# Patient Record
Sex: Male | Born: 1965 | Race: White | Hispanic: No | Marital: Married | Smoking: Never smoker
Health system: Southern US, Community
[De-identification: ages and names within clinical notes are randomized; demographics above are authoritative.]

## PROBLEM LIST (undated history)

## (undated) DIAGNOSIS — E669 Obesity, unspecified: Secondary | ICD-10-CM

## (undated) DIAGNOSIS — E785 Hyperlipidemia, unspecified: Secondary | ICD-10-CM

## (undated) DIAGNOSIS — K649 Unspecified hemorrhoids: Secondary | ICD-10-CM

## (undated) DIAGNOSIS — I10 Essential (primary) hypertension: Secondary | ICD-10-CM

## (undated) DIAGNOSIS — M25562 Pain in left knee: Secondary | ICD-10-CM

## (undated) DIAGNOSIS — G4730 Sleep apnea, unspecified: Secondary | ICD-10-CM

## (undated) DIAGNOSIS — H269 Unspecified cataract: Secondary | ICD-10-CM

## (undated) HISTORY — DX: Obesity, unspecified: E66.9

## (undated) HISTORY — DX: Unspecified cataract: H26.9

## (undated) HISTORY — DX: Unspecified hemorrhoids: K64.9

## (undated) HISTORY — DX: Essential (primary) hypertension: I10

## (undated) HISTORY — DX: Sleep apnea, unspecified: G47.30

## (undated) HISTORY — DX: Hyperlipidemia, unspecified: E78.5

## (undated) HISTORY — PX: HERNIA REPAIR: SHX51

## (undated) HISTORY — DX: Pain in left knee: M25.562

## (undated) HISTORY — PX: ANOSCOPY: SHX299

---
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
## 2005-10-27 ENCOUNTER — Other Ambulatory Visit: Payer: Self-pay

## 2005-10-27 ENCOUNTER — Emergency Department: Payer: Self-pay | Admitting: Emergency Medicine

## 2007-10-20 ENCOUNTER — Ambulatory Visit: Payer: Self-pay | Admitting: Internal Medicine

## 2008-09-26 ENCOUNTER — Ambulatory Visit: Payer: Self-pay | Admitting: Internal Medicine

## 2008-12-05 ENCOUNTER — Ambulatory Visit: Payer: Self-pay | Admitting: Internal Medicine

## 2008-12-09 ENCOUNTER — Ambulatory Visit: Payer: Self-pay | Admitting: Internal Medicine

## 2011-04-09 ENCOUNTER — Encounter: Payer: Self-pay | Admitting: Internal Medicine

## 2011-04-09 ENCOUNTER — Ambulatory Visit (INDEPENDENT_AMBULATORY_CARE_PROVIDER_SITE_OTHER): Payer: Self-pay | Admitting: Internal Medicine

## 2011-04-09 DIAGNOSIS — E785 Hyperlipidemia, unspecified: Secondary | ICD-10-CM

## 2011-04-09 DIAGNOSIS — E119 Type 2 diabetes mellitus without complications: Secondary | ICD-10-CM

## 2011-04-09 DIAGNOSIS — I152 Hypertension secondary to endocrine disorders: Secondary | ICD-10-CM | POA: Insufficient documentation

## 2011-04-09 DIAGNOSIS — E663 Overweight: Secondary | ICD-10-CM

## 2011-04-09 DIAGNOSIS — I10 Essential (primary) hypertension: Secondary | ICD-10-CM

## 2011-04-09 LAB — COMPREHENSIVE METABOLIC PANEL
ALT: 34 U/L (ref 0–53)
AST: 27 U/L (ref 0–37)
Alkaline Phosphatase: 60 U/L (ref 39–117)
Creatinine, Ser: 1 mg/dL (ref 0.4–1.5)
Total Bilirubin: 0.7 mg/dL (ref 0.3–1.2)

## 2011-04-09 LAB — LIPID PANEL
HDL: 34.8 mg/dL — ABNORMAL LOW (ref >39.00)
Total CHOL/HDL Ratio: 5
VLDL: 25 mg/dL (ref 0.0–40.0)

## 2011-04-09 LAB — HEMOGLOBIN A1C: Hgb A1c MFr Bld: 8.2 % — ABNORMAL HIGH (ref 4.6–6.5)

## 2011-04-09 NOTE — Progress Notes (Signed)
  Subjective:    Patient ID: Joshua Arellano, male    DOB: 03/15/1966, 45 y.o.   MRN: [id number]  HPI  45 yo white male with a history of diabetes mellitus, hyperlipidemia and hypertension presents for 3 month followup on chronic medical issues.  Last Hgba1c was 7.2 in early June.  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  Has not been following a carbohydrate restricted diet.    Review of Systems  Constitutional: Negative for fever, chills, diaphoresis, activity change, appetite change, fatigue and unexpected weight change.  HENT: Negative for hearing loss, ear pain, nosebleeds, congestion, sore throat, facial swelling, rhinorrhea, sneezing, drooling, mouth sores, trouble swallowing, neck pain, neck stiffness, dental problem, voice change, postnasal drip, sinus pressure, tinnitus and ear discharge.   Eyes: Negative for photophobia, pain, discharge, redness, itching and visual disturbance.  Respiratory: Negative for apnea, cough, choking, chest tightness, shortness of breath, wheezing and stridor.   Cardiovascular: Negative for chest pain, palpitations and leg swelling.  Gastrointestinal: Negative for nausea, vomiting, abdominal pain, diarrhea, constipation, blood in stool, abdominal distention, anal bleeding and rectal pain.  Genitourinary: Negative for dysuria, urgency, frequency, hematuria, flank pain, decreased urine volume, scrotal swelling, difficulty urinating and testicular pain.  Musculoskeletal: Negative for myalgias, back pain, joint swelling, arthralgias and gait problem.  Skin: Negative for color change, rash and wound.  Neurological: Negative for dizziness, tremors, seizures, syncope, speech difficulty, weakness, light-headedness, numbness and headaches.  Psychiatric/Behavioral: Negative for suicidal ideas, hallucinations, behavioral problems, confusion, sleep disturbance, dysphoric mood,  decreased concentration and agitation. The patient is not nervous/anxious.        Objective:   Physical Exam  Constitutional: He is oriented to person, place, and time. He appears well-developed and well-nourished.  HENT:  Head: Normocephalic and atraumatic.  Mouth/Throat: Oropharynx is clear and moist.  Eyes: Conjunctivae and EOM are normal.  Neck: Normal range of motion. Neck supple. No JVD present. No thyromegaly present.  Cardiovascular: Normal rate, regular rhythm and normal heart sounds.   Pulmonary/Chest: Effort normal and breath sounds normal. He has no wheezes. He has no rales.  Abdominal: Soft. Bowel sounds are normal. He exhibits no mass. There is no tenderness. There is no rebound.  Musculoskeletal: Normal range of motion. He exhibits no edema.  Neurological: He is alert and oriented to person, place, and time.  Skin: Skin is warm and dry.  Psychiatric: He has a normal mood and affect.         Assessment & Plan:

## 2011-04-09 NOTE — Patient Instructions (Addendum)
Check your blood pressure not more than once daily for a week or two and call me with results.  [email or web address] Goal is 130/80 or less.  Labs will determine whether we change your meds or not for diabetes and cholesterol.   You will lose weight if you can cut a few of the starches out of your diet.  Try finding Joseph's low carb flatbread and pita bread . (4 net carbs)  Consider snacking on almonds, pecans, walnuts, macadamia nuts or pistachios.  5 or 6 smaller meals helps your metabolism stay up and helps weight loss .  Consider reading about the Medifast diet (online).    Find out if [facility] offers free TDap VACCINE  to employees.

## 2011-04-09 NOTE — Assessment & Plan Note (Addendum)
Currently using statin therapy for goal < 70.  Repeat lipids are due

## 2011-04-09 NOTE — Assessment & Plan Note (Addendum)
Borderline control per last last  A1c of 7.2 on 01/11/11.  Repeat due this week.  No changes to medications until his labs reviewed. Foot exam was done last visit and sensation was intact to microfilament.  Reminder for diabetic eye exam.

## 2011-04-09 NOTE — Assessment & Plan Note (Signed)
Currently elevated.  Will  have him check his bps at home and adjust medication if elevated,

## 2011-04-17 MED ORDER — METFORMIN HCL ER 750 MG PO TB24: 1500.0000 mg | ORAL_TABLET | Freq: Every day | ORAL | Status: DC

## 2011-04-17 NOTE — Progress Notes (Signed)
Addended by: Duncan Dull on: 04/17/2011 01:02 PM   Modules accepted: Orders

## 2011-04-19 ENCOUNTER — Encounter: Payer: Self-pay | Admitting: Internal Medicine

## 2011-04-19 MED ORDER — METFORMIN HCL ER (OSM) 1000 MG PO TB24: ORAL_TABLET | ORAL | Status: DC

## 2011-04-19 NOTE — Progress Notes (Signed)
Addended by: Duncan Dull on: 04/19/2011 01:32 PM   Modules accepted: Orders

## 2011-04-23 ENCOUNTER — Other Ambulatory Visit: Payer: Self-pay | Admitting: Internal Medicine

## 2011-06-06 ENCOUNTER — Ambulatory Visit: Payer: Self-pay

## 2011-06-07 ENCOUNTER — Telehealth: Payer: Self-pay | Admitting: *Deleted

## 2011-06-07 NOTE — Telephone Encounter (Signed)
Patient's wife sent email to MD regarding being unable to be seen when he was sick. I spoke with patient. He was seen by the UC, dx with bronchitis and is beginning to feel better. I apologized that we were unable to see him when he was sick but we have now put in place processes to be able to see sick patients. Advised him to come in if he did not continue to feel better or became worse.

## 2011-07-10 ENCOUNTER — Ambulatory Visit (INDEPENDENT_AMBULATORY_CARE_PROVIDER_SITE_OTHER): Payer: BC Managed Care – PPO | Admitting: Internal Medicine

## 2011-07-10 ENCOUNTER — Encounter: Payer: Self-pay | Admitting: Internal Medicine

## 2011-07-10 DIAGNOSIS — Z23 Encounter for immunization: Secondary | ICD-10-CM

## 2011-07-10 DIAGNOSIS — M25562 Pain in left knee: Secondary | ICD-10-CM | POA: Insufficient documentation

## 2011-07-10 DIAGNOSIS — E669 Obesity, unspecified: Secondary | ICD-10-CM

## 2011-07-10 DIAGNOSIS — I10 Essential (primary) hypertension: Secondary | ICD-10-CM

## 2011-07-10 DIAGNOSIS — E785 Hyperlipidemia, unspecified: Secondary | ICD-10-CM

## 2011-07-10 DIAGNOSIS — K625 Hemorrhage of anus and rectum: Secondary | ICD-10-CM

## 2011-07-10 DIAGNOSIS — K649 Unspecified hemorrhoids: Secondary | ICD-10-CM | POA: Insufficient documentation

## 2011-07-10 DIAGNOSIS — E119 Type 2 diabetes mellitus without complications: Secondary | ICD-10-CM

## 2011-07-10 LAB — COMPREHENSIVE METABOLIC PANEL
Albumin: 4.4 g/dL (ref 3.5–5.2)
CO2: 28 mEq/L (ref 19–32)
Chloride: 103 mEq/L (ref 96–112)
GFR: 139.72 mL/min (ref >60.00)
Glucose, Bld: 111 mg/dL — ABNORMAL HIGH (ref 70–99)
Potassium: 4.4 mEq/L (ref 3.5–5.1)
Sodium: 139 mEq/L (ref 135–145)
Total Protein: 7 g/dL (ref 6.0–8.3)

## 2011-07-10 LAB — LDL CHOLESTEROL, DIRECT: Direct LDL: 103.7 mg/dL

## 2011-07-10 MED ORDER — AMLODIPINE BESYLATE 10 MG PO TABS: 10.0000 mg | ORAL_TABLET | Freq: Every day | ORAL | Status: DC

## 2011-07-10 NOTE — Patient Instructions (Addendum)
Increase amlodipine to 10 mg daily  For goal bp 130/80  Or less.  Recheck in one week  Try a shot of Afrin each side before bed for snoring.   If we can get your old prescription /sleep study results,  We will order CPA from that

## 2011-07-10 NOTE — Assessment & Plan Note (Signed)
Prior colonoscopy done bc of hemorrhoids was clean.

## 2011-07-10 NOTE — Assessment & Plan Note (Signed)
With recent bleeding,  Prior colonoscopy

## 2011-07-11 ENCOUNTER — Telehealth: Payer: Self-pay | Admitting: Internal Medicine

## 2011-07-11 DIAGNOSIS — I499 Cardiac arrhythmia, unspecified: Secondary | ICD-10-CM

## 2011-07-11 DIAGNOSIS — E669 Obesity, unspecified: Secondary | ICD-10-CM | POA: Insufficient documentation

## 2011-07-11 NOTE — Assessment & Plan Note (Signed)
Addressed at last visit.  He has lost 10 lbs in the last 3 months.

## 2011-07-11 NOTE — Progress Notes (Signed)
  Subjective:    Patient ID: Joshua Arellano, male    DOB: [birth year], 45 y.o.   MRN: [id number]  HPI  45 yo white male with history of Type 2 DM, hyperlipidemia, obesity returns for 3 month follow up on chronic medical conditions.  He feesl generally well , has been exercising, watching his diet and losing weight since his last visit     Review of Systems     Objective:   Physical Exam        Assessment & Plan:   Subjective:     Joshua Arellano is a 45 y.o. male who presents for follow up of diabetes.. Current symptoms include: none. Patient denies foot ulcerations, hypoglycemia , paresthesia of the feet, polydipsia, polyuria and visual disturbances. Evaluation to date has been: fasting blood sugar, fasting lipid panel and hemoglobin A1C. Home sugars: BGs consistently in an acceptable range. Current treatments: more intensive attention to diet which has been effective and increased aerobic exercise which has been effective. Last dilated eye exam within the last year.  The following portions of the patient's history were reviewed and updated as appropriate: allergies, current medications, past family history, past medical history, past social history, past surgical history and problem list.  Review of Systems A comprehensive review of systems was negative.    Objective:    BP 140/86  Pulse 85  Temp(Src) 98.2 F (36.8 C) (Oral)  Resp 16  Ht 5\' 11"  (1.803 m)  Wt 238 lb 8 oz (108.183 kg)  BMI 33.26 kg/m2  SpO2 100% General appearance: alert, cooperative and appears stated age Neck: no adenopathy, no carotid bruit, no JVD, supple, symmetrical, trachea midline and thyroid not enlarged, symmetric, no tenderness/mass/nodules Lungs: clear to auscultation bilaterally Heart: regular rate and rhythm, S1, S2 normal, no murmur, click, rub or gallop Abdomen: soft, non-tender; bowel sounds normal; no masses,  no organomegaly Extremities: extremities normal, atraumatic, no cyanosis or  edema    @DMFOOTEXAM @  Patient was evaluated for proper footwear and sizing.  Laboratory: No components found with this basename: A1C      Assessment:    Diabetes mellitus Type II, under good control.    Plan:    Counseling at today's visit: set a weight loss goal of 12 lbs over the next 3 months. Encouraged aerobic exercise. Reminded to get yearly retinal exam. Discussed ways to avoid symptomatic hypoglycemia. Continued sulfonylurea; see medication orders.  Continued metformin; see  medication orders.

## 2011-07-11 NOTE — Assessment & Plan Note (Signed)
Repeat cholesterol in December was markedly different; LDL HDL 112 trigs 63 .   continue pravastatin .

## 2011-07-11 NOTE — Telephone Encounter (Signed)
ereror

## 2011-07-11 NOTE — Assessment & Plan Note (Addendum)
Not at goal,  Elevation confirmed.  Increase amlodipine dose to 10 mg daily.  He is on maximum dose of losartan at 100 mg daily.

## 2011-10-08 ENCOUNTER — Encounter: Payer: Self-pay | Admitting: Internal Medicine

## 2011-10-08 ENCOUNTER — Ambulatory Visit (INDEPENDENT_AMBULATORY_CARE_PROVIDER_SITE_OTHER): Payer: BC Managed Care – PPO | Admitting: Internal Medicine

## 2011-10-08 DIAGNOSIS — I10 Essential (primary) hypertension: Secondary | ICD-10-CM

## 2011-10-08 DIAGNOSIS — E785 Hyperlipidemia, unspecified: Secondary | ICD-10-CM

## 2011-10-08 DIAGNOSIS — E119 Type 2 diabetes mellitus without complications: Secondary | ICD-10-CM

## 2011-10-08 NOTE — Assessment & Plan Note (Signed)
Managed with glipizide and metformin and diet.  hgba1c is due .  He is up to date with eye exams.

## 2011-10-08 NOTE — Assessment & Plan Note (Signed)
Slightly above goal on rechek 140/86.  Will have him recheck bp at home and adjust medications if needed.

## 2011-10-08 NOTE — Assessment & Plan Note (Signed)
continue pravastatin for goal LDL 70

## 2011-10-08 NOTE — Progress Notes (Signed)
Subjective:    Patient ID: Joshua Arellano, male    DOB: [birth year], 46 y.o.   MRN: [id number]  HPI  46 yr old male with history of DM 2 , hyperlipidemia presents for 3 month follow up.  He has had some lapses in diabetes control , has gained a few lbs,  But has started a "couch to 5K" running program about 3 weeks ago.  No recent lows,  Taking his medications regularly.   Past Medical History  Diagnosis Date  . Diabetes mellitus   . Hyperlipidemia   . Hypertension   . Obesity (BMI 30-39.9)   . Hemorrhoid   . Knee pain, left     episodic   Current Outpatient Prescriptions on File Prior to Visit  Medication Sig Dispense Refill  . ACCU-CHEK COMPACT TEST DRUM test strip TEST TWICE DAILY  102 each  11  . glipiZIDE (GLUCOTROL) 10 MG tablet Take 10 mg by mouth 2 (two) times daily before a meal.        . hydrochlorothiazide 25 MG tablet Take 25 mg by mouth daily.        Marland Kitchen losartan (COZAAR) 100 MG tablet Take 100 mg by mouth daily.        . metFORMIN (FORTAMET) 1000 MG (OSM) 24 hr tablet Two tablets by mouth every day  60 tablet  3  . pravastatin (PRAVACHOL) 40 MG tablet Take 40 mg by mouth daily.         Review of Systems  Constitutional: Negative for fever, chills, diaphoresis, activity change, appetite change, fatigue and unexpected weight change.  HENT: Negative for hearing loss, ear pain, nosebleeds, congestion, sore throat, facial swelling, rhinorrhea, sneezing, drooling, mouth sores, trouble swallowing, neck pain, neck stiffness, dental problem, voice change, postnasal drip, sinus pressure, tinnitus and ear discharge.   Eyes: Negative for photophobia, pain, discharge, redness, itching and visual disturbance.  Respiratory: Negative for apnea, cough, choking, chest tightness, shortness of breath, wheezing and stridor.   Cardiovascular: Negative for chest pain, palpitations and leg swelling.  Gastrointestinal: Negative for nausea, vomiting, abdominal pain, diarrhea, constipation, blood in  stool, abdominal distention, anal bleeding and rectal pain.  Genitourinary: Negative for dysuria, urgency, frequency, hematuria, flank pain, decreased urine volume, scrotal swelling, difficulty urinating and testicular pain.  Musculoskeletal: Negative for myalgias, back pain, joint swelling, arthralgias and gait problem.  Skin: Negative for color change, rash and wound.  Neurological: Negative for dizziness, tremors, seizures, syncope, speech difficulty, weakness, light-headedness, numbness and headaches.  Psychiatric/Behavioral: Negative for suicidal ideas, hallucinations, behavioral problems, confusion, sleep disturbance, dysphoric mood, decreased concentration and agitation. The patient is not nervous/anxious.        Objective:   Physical Exam  Constitutional: He is oriented to person, place, and time.  HENT:  Head: Normocephalic and atraumatic.  Mouth/Throat: Oropharynx is clear and moist.  Eyes: Conjunctivae and EOM are normal.  Neck: Normal range of motion. Neck supple. No JVD present. No thyromegaly present.  Cardiovascular: Normal rate, regular rhythm and normal heart sounds.   Pulmonary/Chest: Effort normal and breath sounds normal. He has no wheezes. He has no rales.  Abdominal: Soft. Bowel sounds are normal. He exhibits no mass. There is no tenderness. There is no rebound.  Musculoskeletal: Normal range of motion. He exhibits no edema.  Neurological: He is alert and oriented to person, place, and time.  Skin: Skin is warm and dry.  Psychiatric: He has a normal mood and affect.      Assessment & Plan:  Diabetes mellitus Managed with glipizide and metformin and diet.  hgba1c is due .  He is up to date with eye exams.   Dyslipidemia, goal LDL below 70 continue pravastatin for goal LDL 70  Hypertension Slightly above goal on rechek 140/86.  Will have him recheck bp at home and adjust medications if needed.     Updated Medication List Outpatient Encounter Prescriptions  as of 10/08/2011  Medication Sig Dispense Refill  . ACCU-CHEK COMPACT TEST DRUM test strip TEST TWICE DAILY  102 each  11  . amLODipine (NORVASC) 10 MG tablet Take 5 mg by mouth daily.      Marland Kitchen glipiZIDE (GLUCOTROL) 10 MG tablet Take 10 mg by mouth 2 (two) times daily before a meal.        . hydrochlorothiazide 25 MG tablet Take 25 mg by mouth daily.        Marland Kitchen losartan (COZAAR) 100 MG tablet Take 100 mg by mouth daily.        . metFORMIN (FORTAMET) 1000 MG (OSM) 24 hr tablet Two tablets by mouth every day  60 tablet  3  . pravastatin (PRAVACHOL) 40 MG tablet Take 40 mg by mouth daily.        Marland Kitchen DISCONTD: amLODipine (NORVASC) 10 MG tablet Take 1 tablet (10 mg total) by mouth daily.  30 tablet  2

## 2011-10-08 NOTE — Patient Instructions (Addendum)
Continue 5 mg amlodipine for now,  Increase  in 1 month if BP continues to be > 130/80  We may end up dropping the hctz as your start to work out more if you develop trouble with cramping     Low glycemic index diet, eating 6 smaller meals daily  Protein  Shakes (EAS Carb Control  Or Atkins ,  Available everywhere,   In  cases at BJs )  2.5 carbs  Protein bar by Atkins (snack size,  BJ's)  At 10 am  Lunch: sandwich on pita bread (Joseph's makes a pita bread and a flat bread , available at [employer] and BJ's)  Mission makes a low carb tortilla.  Mid day :  Another protein bar,  Or cheese stick, almonds, walnuts, pistachios, pecans, peanuts,  Macadamia nuts  Dinner:  "mean and green:"  Meat, salad, and green veggie : use ranch, vinagrette,  Blue cheese, etc  Evening : Breyer's low carb fudgiscle, ice cream (Carb Smart)

## 2011-10-09 LAB — COMPLETE METABOLIC PANEL WITH GFR
CO2: 26 mEq/L (ref 19–32)
Calcium: 9.8 mg/dL (ref 8.4–10.5)
Chloride: 100 mEq/L (ref 96–112)
Creat: 0.93 mg/dL (ref 0.50–1.35)
GFR, Est African American: >89 mL/min
GFR, Est Non African American: >89 mL/min
Glucose, Bld: 226 mg/dL — ABNORMAL HIGH (ref 70–99)
Sodium: 139 mEq/L (ref 135–145)
Total Bilirubin: 0.5 mg/dL (ref 0.3–1.2)
Total Protein: 6.6 g/dL (ref 6.0–8.3)

## 2011-11-05 ENCOUNTER — Telehealth: Payer: Self-pay | Admitting: Internal Medicine

## 2011-11-05 ENCOUNTER — Ambulatory Visit: Payer: Self-pay | Admitting: Internal Medicine

## 2011-11-05 NOTE — Telephone Encounter (Signed)
Triage Record Num: [id number] Operator: Geanie Berlin Patient Name: Joshua Arellano Call Date & Time: 11/05/2011 1:27:23PM Patient Phone: [phone] PCP: Duncan Dull Patient Gender: Male PCP Fax : [phone] Patient DOB: [birth year] Practice Name: [facility] Reason for Call: Caller: Joshua Arellano/Patient; PCP: Duncan Dull; CB#: [phone]; Call regarding Posison Ivy On the Face.; Onset 11/03/11. Afebrile. FBS not tested. Has poison ivy on arms, legs and now face, near nose and R eye. R eye is swollen. Using Hydrocortisone QID. Area near knee is "oozing." Advised to see MD within 4 hrs for involves the eyes per Poison Ivy Exposure Guideline. No appt remain for 11/05/11; instructed to go to UC. Is in [place]; states will go to local UC. Protocol(s) Used: [employer], [place], or [facility] Exposure Recommended Outcome per Protocol: See Provider within 4 hours Reason for Outcome: Involves eyes, mouth, or genitals. Care Advice: ~ Resting will help avoid overheating and sweating which will intensify the irritation. Apply cool compresses to affected area(s) for 20 minutes 4 to 6 times daily to help relieve itching and provide a topical anesthesia. ~ ~ Call provider if symptoms worsen or new symptoms develop. ~ SYMPTOM / CONDITION MANAGEMENT ~ List, or take, all current prescription(s), nonprescription or alternative medication(s) to provider for evaluation. For symptom relief, consider nonprescription antihistamines (such as Allerest, Claritin, Zyrtec, Chlor-Trimetron, Benadryl, etc.) as directed on label or by pharmacist. Drowsiness may result, especially in geriatric patients. Non-sedating antihistamines are available without a prescription. ~ Itching Relief: - Avoid scratching or rubbing irritated area; may cause further irritation and secondary infection - Take cool showers or baths several times a day to relieve itching; do not use soap - If cool water alone does not relieve  itching, try adding 1/2 to 1 cup baking soda to bath water - Follow with application of a bland lotion such as calamine (do not apply to the eyes or genitals). ~ 04/

## 2011-11-05 NOTE — Telephone Encounter (Signed)
Caller: Brock/Patient; PCP: Duncan Dull; CB#: [phone]; Call regarding Posison Ivy On the Face.; Onset 11/03/11.  Afebrile. FBS not tested.  Has poison ivy on arms, legs and now face, near nose and R eye. R eye is swollen.  Using Hydrocortisone QID. Area near knee is "oozing."  Advised to see MD within 4 hrs for involves the eyes per Poison Ivy Exposure Guideline.  No appt remain for 11/05/11; instructed to go to UC.  Is in [place]; states will go to local UC.

## 2012-01-21 ENCOUNTER — Encounter: Payer: Self-pay | Admitting: Internal Medicine

## 2012-01-21 ENCOUNTER — Ambulatory Visit (INDEPENDENT_AMBULATORY_CARE_PROVIDER_SITE_OTHER): Payer: BC Managed Care – PPO | Admitting: Internal Medicine

## 2012-01-21 VITALS — BP 124/78 | HR 88 | Temp 98.0°F | Resp 14 | Wt 235.0 lb

## 2012-01-21 DIAGNOSIS — E119 Type 2 diabetes mellitus without complications: Secondary | ICD-10-CM

## 2012-01-21 DIAGNOSIS — E785 Hyperlipidemia, unspecified: Secondary | ICD-10-CM

## 2012-01-21 DIAGNOSIS — I10 Essential (primary) hypertension: Secondary | ICD-10-CM

## 2012-01-21 DIAGNOSIS — E669 Obesity, unspecified: Secondary | ICD-10-CM

## 2012-01-21 MED ORDER — HYDROCHLOROTHIAZIDE 25 MG PO TABS: 25.0000 mg | ORAL_TABLET | Freq: Every day | ORAL | Status: DC

## 2012-01-21 MED ORDER — PRAVASTATIN SODIUM 40 MG PO TABS: 40.0000 mg | ORAL_TABLET | Freq: Every day | ORAL | Status: DC

## 2012-01-21 MED ORDER — AMLODIPINE BESYLATE 10 MG PO TABS: 5.0000 mg | ORAL_TABLET | Freq: Every day | ORAL | Status: DC

## 2012-01-21 MED ORDER — LOSARTAN POTASSIUM 100 MG PO TABS: 100.0000 mg | ORAL_TABLET | Freq: Every day | ORAL | Status: DC

## 2012-01-21 NOTE — Patient Instructions (Addendum)
Consider the Low Glycemic Index Diet and 6 smaller meals daily .  This boosts your metabolism and regulates your sugars:   7 AM Low carbohydrate Protein  Shakes (EAS Carb Control  Or Atkins ,  Available everywhere,   In  cases at BJs )  2.5 carbs  (Add or substitute a toasted sandwhich thin w/ peanut butter)  10 AM: Protein bar by Atkins (snack size,  Chocolate lover's variety at  BJ's)    Lunch: sandwich on pita bread or flatbread (Joseph's makes a pita bread and a flat bread , available at [employer] and BJ's; Toufayah makes a low carb flatbread available at [employer] and [employer]) Mission makes a low carb whole wheat tortilla available at [employer] most grocery stores   3 PM:  Mid day :  Another protein bar,  Or a  cheese stick, 1/4 cup of almonds, walnuts, pistachios, pecans, peanuts,  Macadamia nuts  6 PM  Dinner:  "mean and green:"  Meat/chicken/fish, salad, and green veggie : use ranch, vinagrette,  Blue cheese, etc  9 PM snack : Breyer's low carb fudgsicle or  ice cream bar (Carb Smart), or  Weight Watcher's ice cream bar , or another protein shake  Also try [place] yogurt,  The lowest carb is Dannon's followed by [employer].

## 2012-01-22 ENCOUNTER — Encounter: Payer: Self-pay | Admitting: Internal Medicine

## 2012-01-22 NOTE — Assessment & Plan Note (Signed)
I have addressed  BMI and recommended a low glycemic index diet utilizing smaller more frequent meals to increase metabolism.  I have also recommended that patient start regular exercise with a goal of 30 minutes of aerobic exercise a minimum of 5 days per week.

## 2012-01-22 NOTE — Progress Notes (Signed)
Patient ID: Joshua Arellano, male   DOB: 12-Oct-1965, 46 y.o.   MRN: [id number]  Patient Active Problem List  Diagnosis  . Diabetes mellitus type 2 in obese  . Dyslipidemia, goal LDL below 70  . Hypertension  . Rectal bleeding  . Hemorrhoid  . Knee pain, left  . Obesity (BMI 30-39.9)    Subjective:  CC:   Chief Complaint  Patient presents with  . Follow-up    HPI:   Joshua Arellano a 46 y.o. male who presents for Follow up on diabetes,  hypertension, hyperlipiemia and overweight.  He has entered a diabetes study that had him wearing a sub q glucose meter for a week, whic hhe has returned ,  Results are pending. he started taking victoza on June 13.  His morning fasting sugars have been < 100 but post  breakfast are 200 due to food choices.  e has stopped his evening dose of glipizide as og due to several low blood sugars iat 3 am (as low as 63) and has been keeping a food and cbg log for the past week .  His diet has a significant amount of fast food , junk food ([employer], biscuit, Chik Fil A sandwich,  Ar[employer] Reuben, and potato chips all in one day) and starches (egg rolls, rice, hash browns) He has lost 8 lbs in 3 months.    Past Medical History  Diagnosis Date  . Diabetes mellitus   . Hyperlipidemia   . Hypertension   . Obesity (BMI 30-39.9)   . Hemorrhoid   . Knee pain, left     episodic    History reviewed. No pertinent past surgical history.       The following portions of the patient's history were reviewed and updated as appropriate: Allergies, current medications, and problem list.    Review of Systems:   12 Pt  review of systems was negative except those addressed in the HPI,     History   Social History  . Marital Status: Married    Spouse Name: N/A    Number of Children: N/A  . Years of Education: N/A   Occupational History  . Not on file.   Social History Main Topics  . Smoking status: Never Smoker   . Smokeless tobacco: Never Used    . Alcohol Use: No     rare  . Drug Use: No  . Sexually Active: Not on file   Other Topics Concern  . Not on file   Social History Narrative  . No narrative on file    Objective:  BP 124/78  Pulse 88  Temp 98 F (36.7 C) (Oral)  Resp 14  Wt 235 lb (106.595 kg)  SpO2 97%  General appearance: alert, cooperative and appears stated age Ears: normal TM's and external ear canals both ears Throat: lips, mucosa, and tongue normal; teeth and gums normal Neck: no adenopathy, no carotid bruit, supple, symmetrical, trachea midline and thyroid not enlarged, symmetric, no tenderness/mass/nodules Back: symmetric, no curvature. ROM normal. No CVA tenderness. Lungs: clear to auscultation bilaterally Heart: regular rate and rhythm, S1, S2 normal, no murmur, click, rub or gallop Abdomen: soft, non-tender; bowel sounds normal; no masses,  no organomegaly Pulses: 2+ and symmetric Skin: Skin color, texture, turgor normal. No rashes or lesions Lymph nodes: Cervical, supraclavicular, and axillary nodes normal.  Assessment and Plan:  Diabetes mellitus type 2 in obese With some weight loss since starting victoza. He continues tro eat a diet  not appropriate for an overweight diabetic but has started to see the effect of his choices on his blood sugars. He is due for hgba1c which is now doen through his study, so I will await results,.  Continue victoza and increase dose to 1.8 mg  Dyslipidemia, goal LDL below 70 LDL was 105 and trgs normal in Dec.  HDL was spuriously (?) high at 112 at that time,  continue pravastatin and recheck is due.  Hypertension Well controlled on current regimen. Renal function stable, no changes today.  Obesity (BMI 30-39.9) I have addressed  BMI and recommended a low glycemic index diet utilizing smaller more frequent meals to increase metabolism.  I have also recommended that patient start regular exercise with a goal of 30 minutes of aerobic exercise a minimum of 5  days per week.     Updated Medication List Outpatient Encounter Prescriptions as of 01/21/2012  Medication Sig Dispense Refill  . ACCU-CHEK COMPACT TEST DRUM test strip TEST TWICE DAILY  102 each  11  . amLODipine (NORVASC) 10 MG tablet Take 0.5 tablets (5 mg total) by mouth daily.  90 tablet  3  . hydrochlorothiazide (HYDRODIURIL) 25 MG tablet Take 1 tablet (25 mg total) by mouth daily.  90 tablet  3  . Liraglutide (VICTOZA) 18 MG/3ML SOLN Inject 1.2 mLs into the skin daily.      Marland Kitchen losartan (COZAAR) 100 MG tablet Take 1 tablet (100 mg total) by mouth daily.  90 tablet  3  . metFORMIN (FORTAMET) 1000 MG (OSM) 24 hr tablet Two tablets by mouth every day  60 tablet  3  . pravastatin (PRAVACHOL) 40 MG tablet Take 1 tablet (40 mg total) by mouth daily.  90 tablet  3  . DISCONTD: amLODipine (NORVASC) 10 MG tablet Take 5 mg by mouth daily.      Marland Kitchen DISCONTD: hydrochlorothiazide 25 MG tablet Take 25 mg by mouth daily.        Marland Kitchen DISCONTD: losartan (COZAAR) 100 MG tablet Take 100 mg by mouth daily.        Marland Kitchen DISCONTD: pravastatin (PRAVACHOL) 40 MG tablet Take 40 mg by mouth daily.        Marland Kitchen DISCONTD: glipiZIDE (GLUCOTROL) 10 MG tablet Take 10 mg by mouth 2 (two) times daily before a meal.           No orders of the defined types were placed in this encounter.    Return in about 3 months (around 04/22/2012).

## 2012-01-22 NOTE — Assessment & Plan Note (Signed)
LDL was 105 and trgs normal in Dec.  HDL was spuriously (?) high at 112 at that time,  continue pravastatin and recheck is due.

## 2012-01-22 NOTE — Assessment & Plan Note (Signed)
Well controlled on current regimen. Renal function stable, no changes today. 

## 2012-01-22 NOTE — Assessment & Plan Note (Signed)
With some weight loss since starting victoza. He continues tro eat a diet not appropriate for an overweight diabetic but has started to see the effect of his choices on his blood sugars. He is due for hgba1c which is now doen through his study, so I will await results,.  Continue victoza and increase dose to 1.8 mg

## 2012-02-27 ENCOUNTER — Other Ambulatory Visit: Payer: Self-pay | Admitting: *Deleted

## 2012-02-27 MED ORDER — HYDROCHLOROTHIAZIDE 25 MG PO TABS: 25.0000 mg | ORAL_TABLET | Freq: Every day | ORAL | Status: DC

## 2012-04-03 ENCOUNTER — Other Ambulatory Visit: Payer: Self-pay | Admitting: Internal Medicine

## 2012-04-03 MED ORDER — PRAVASTATIN SODIUM 40 MG PO TABS: 40.0000 mg | ORAL_TABLET | Freq: Every day | ORAL | Status: DC

## 2012-04-30 ENCOUNTER — Ambulatory Visit (INDEPENDENT_AMBULATORY_CARE_PROVIDER_SITE_OTHER): Payer: BC Managed Care – PPO | Admitting: Internal Medicine

## 2012-04-30 ENCOUNTER — Encounter: Payer: Self-pay | Admitting: Internal Medicine

## 2012-04-30 VITALS — BP 116/70 | HR 94 | Temp 97.8°F | Resp 18 | Wt 224.8 lb

## 2012-04-30 DIAGNOSIS — E669 Obesity, unspecified: Secondary | ICD-10-CM

## 2012-04-30 DIAGNOSIS — Q5320 Undescended testicle, unspecified, bilateral: Secondary | ICD-10-CM

## 2012-04-30 DIAGNOSIS — Z0000 Encounter for general adult medical examination without abnormal findings: Secondary | ICD-10-CM

## 2012-04-30 DIAGNOSIS — Q539 Undescended testicle, unspecified: Secondary | ICD-10-CM

## 2012-04-30 DIAGNOSIS — E876 Hypokalemia: Secondary | ICD-10-CM

## 2012-04-30 DIAGNOSIS — Z125 Encounter for screening for malignant neoplasm of prostate: Secondary | ICD-10-CM | POA: Insufficient documentation

## 2012-04-30 DIAGNOSIS — E785 Hyperlipidemia, unspecified: Secondary | ICD-10-CM

## 2012-04-30 DIAGNOSIS — E119 Type 2 diabetes mellitus without complications: Secondary | ICD-10-CM

## 2012-04-30 DIAGNOSIS — E1169 Type 2 diabetes mellitus with other specified complication: Secondary | ICD-10-CM

## 2012-04-30 LAB — MICROALBUMIN / CREATININE URINE RATIO: Creatinine,U: 43.5 mg/dL

## 2012-04-30 LAB — COMPREHENSIVE METABOLIC PANEL
AST: 24 U/L (ref 0–37)
Albumin: 4.1 g/dL (ref 3.5–5.2)
Alkaline Phosphatase: 59 U/L (ref 39–117)
BUN: 8 mg/dL (ref 6–23)
Potassium: 3.4 mEq/L — ABNORMAL LOW (ref 3.5–5.1)
Sodium: 138 mEq/L (ref 135–145)
Total Protein: 7.3 g/dL (ref 6.0–8.3)

## 2012-04-30 LAB — LIPID PANEL
LDL Cholesterol: 93 mg/dL (ref 0–99)
VLDL: 10.4 mg/dL (ref 0.0–40.0)

## 2012-04-30 MED ORDER — METFORMIN HCL ER 750 MG PO TB24: ORAL_TABLET | ORAL | Status: DC

## 2012-04-30 NOTE — Progress Notes (Signed)
Patient ID: Joshua Arellano, male   DOB: 03-29-1966, 46 y.o.   MRN: [id number]  Patient Active Problem List  Diagnosis  . Diabetes mellitus type 2 in obese  . Dyslipidemia, goal LDL below 70  . Hypertension  . Rectal bleeding  . Hemorrhoid  . Knee pain, left  . Obesity (BMI 30-39.9)  . Palpable undescended testes  . Routine general medical examination at a health care facility    Subjective:  CC:   Chief Complaint  Patient presents with  . Annual Exam    HPI:   Joshua Arellano a 46 y.o. male who presents for his annual exam. In for followup on his chronic medical conditions which include diabetes obesity and hyperlipidemia. Three-month followup on diabetes. He has had progressive loss of control over the last year.  He has had difficutly adhering to a lowglycemic index diet because he  and eats at fast food restaurants at least 8 times per week. He is enrolled in diabetes study at [facility] which is testing impact of 24-hour glucometer on glycemic control. He has brought several spread sheets that correlate his blood sugars to his activity level and his diet. Every blood sugar that followed a fast food meal with the exception of [employer] was uncontrolled. He does note that his blood sugars remained under excellent control if he substituted and Atkins protein bar for his meal or snack.  Victoza was added to and his hgba1c improved for 8 to 6.4.  His dose of glimperide caused recurrent hypoglycemia so his dose was reduced to 1 mg daily by [facility] endocrinologist Dr. Wylie Hail.  He is having One or two loose stools daily. He is not in a supervised exercise program. He has lost 5 pounds since his last visit    Past Medical History  Diagnosis Date  . Diabetes mellitus   . Hyperlipidemia   . Hypertension   . Obesity (BMI 30-39.9)   . Hemorrhoid   . Knee pain, left     episodic    History reviewed. No pertinent past surgical history.       The following portions of the patient's  history were reviewed and updated as appropriate: Allergies, current medications, and problem list.    Review of Systems:   12 Pt  review of systems was negative except those addressed in the HPI,     History   Social History  . Marital Status: Married    Spouse Name: N/A    Number of Children: N/A  . Years of Education: N/A   Occupational History  . Not on file.   Social History Main Topics  . Smoking status: Never Smoker   . Smokeless tobacco: Never Used  . Alcohol Use: No     rare  . Drug Use: No  . Sexually Active: Not on file   Other Topics Concern  . Not on file   Social History Narrative  . No narrative on file    Objective:  BP 116/70  Pulse 94  Temp 97.8 F (36.6 C) (Oral)  Resp 18  Wt 224 lb 12 oz (101.946 kg)  SpO2 99%  General appearance: alert, cooperative and appears stated age Ears: normal TM's and external ear canals both ears Throat: lips, mucosa, and tongue normal; teeth and gums normal Neck: no adenopathy, no carotid bruit, supple, symmetrical, trachea midline and thyroid not enlarged, symmetric, no tenderness/mass/nodules Back: symmetric, no curvature. ROM normal. No CVA tenderness. Lungs: clear to auscultation bilaterally Heart: regular  rate and rhythm, S1, S2 normal, no murmur, click, rub or gallop Abdomen: soft, non-tender; bowel sounds normal; no masses,  no organomegaly Pulses: 2+ and symmetric Skin: Skin color, texture, turgor normal. No rashes or lesions Lymph nodes: Cervical, supraclavicular, and axillary nodes normal.  Assessment and Plan:  Palpable undescended testes He has extremely small testicles are very very small in the setting of a very full inguinal area. I am wondering if perhaps his testicles are only partly descended.   he's never had a urology exam, so I'm referring him to [facility] urology further evaluation.  Obesity (BMI 30-39.9) He has lost 20 pounds since March and lower his BMI of 34.3-31.7. I continue to  encourage him to follow a low glycemic index diet and obtain regular exercise which would help him lower his BMI.  Diabetes mellitus type 2 in obese His control has improved since starting the toes and using a 24-hour glucometer. His last A1c was done at [facility] and was reportedly 6.9.   Updated Medication List Outpatient Encounter Prescriptions as of 04/30/2012  Medication Sig Dispense Refill  . ACCU-CHEK COMPACT TEST DRUM test strip TEST TWICE DAILY  102 each  11  . amLODipine (NORVASC) 10 MG tablet Take 0.5 tablets (5 mg total) by mouth daily.  90 tablet  3  . glimepiride (AMARYL) 2 MG tablet Take 1 mg by mouth daily before breakfast.      . hydrochlorothiazide (HYDRODIURIL) 25 MG tablet Take 1 tablet (25 mg total) by mouth daily.  90 tablet  3  . Liraglutide (VICTOZA) 18 MG/3ML SOLN Inject 1.2 mLs into the skin daily.      Marland Kitchen losartan (COZAAR) 100 MG tablet Take 1 tablet (100 mg total) by mouth daily.  90 tablet  3  . metformin (GLUCOPHAGE-XR) 750 MG 24 hr tablet Two tablets by mouth every day  60 tablet  3  . pravastatin (PRAVACHOL) 40 MG tablet Take 1 tablet (40 mg total) by mouth daily.  90 tablet  3  . DISCONTD: metFORMIN (FORTAMET) 1000 MG (OSM) 24 hr tablet Two tablets by mouth every day  60 tablet  3     Orders Placed This Encounter  Procedures  . Microalbumin / creatinine urine ratio  . Lipid panel  . Comprehensive metabolic panel  . Ambulatory referral to Urology    No Follow-up on file.

## 2012-04-30 NOTE — Patient Instructions (Addendum)
Suspend hctz for a few weeks to see if dizziness resolves.  If so,  Fine,  If not,  We can try reducing the amlodipidne to 5 mg daily   Goal 140/80 or less

## 2012-05-03 ENCOUNTER — Encounter: Payer: Self-pay | Admitting: Internal Medicine

## 2012-05-03 DIAGNOSIS — E876 Hypokalemia: Secondary | ICD-10-CM | POA: Insufficient documentation

## 2012-05-03 MED ORDER — POTASSIUM CHLORIDE CRYS ER 20 MEQ PO TBCR: 20.0000 meq | EXTENDED_RELEASE_TABLET | Freq: Every day | ORAL | Status: DC

## 2012-05-03 MED ORDER — ATORVASTATIN CALCIUM 20 MG PO TABS: 20.0000 mg | ORAL_TABLET | Freq: Every day | ORAL | Status: DC

## 2012-05-03 NOTE — Assessment & Plan Note (Signed)
His triglycerides are well controlled but his LDL remains above goal on 40 mg of pravastatin.   we will switch him to Lipitor 20 mg with next refill.

## 2012-05-03 NOTE — Assessment & Plan Note (Addendum)
He has lost 20 pounds since March and lower his BMI of 34.3-31.7. I continue to encourage him to follow a low glycemic index diet and obtain regular exercise which would help him lower his BMI.

## 2012-05-03 NOTE — Assessment & Plan Note (Signed)
His control has improved since starting the toes and using a 24-hour glucometer. His last A1c was done at [facility] and was reportedly 6.9.

## 2012-05-03 NOTE — Assessment & Plan Note (Signed)
He has extremely small testicles are very very small in the setting of a very full inguinal area. I am wondering if perhaps his testicles are only partly descended.   he's never had a urology exam, so I'm referring him to [facility] urology further evaluation.

## 2012-05-04 ENCOUNTER — Telehealth: Payer: Self-pay | Admitting: Internal Medicine

## 2012-05-04 DIAGNOSIS — E876 Hypokalemia: Secondary | ICD-10-CM

## 2012-05-04 MED ORDER — PRAVASTATIN SODIUM 40 MG PO TABS: 40.0000 mg | ORAL_TABLET | Freq: Every day | ORAL | Status: DC

## 2012-05-04 NOTE — Telephone Encounter (Signed)
Rx# [id number] Potassium cl 20 meq er tablets Take 1 tablet by mouth  Daily Qty requested 90 orginial quantity 5

## 2012-05-06 NOTE — Telephone Encounter (Signed)
The potassium was only to be given for 5 days; he does not need 90;  no refills.

## 2012-05-06 NOTE — Telephone Encounter (Signed)
OK to refill or was this just for short-term use?

## 2012-05-07 ENCOUNTER — Encounter: Payer: Self-pay | Admitting: Internal Medicine

## 2012-05-07 NOTE — Telephone Encounter (Signed)
Spoke with patient and he wasn't aware that he was supposed to even take K+. I advised him of lab results and explained short course to replete levels. I advised he would not need a refill. He verbalized understanding and said he would go to pharmacy today. Pharmacy notified.

## 2012-08-03 ENCOUNTER — Ambulatory Visit (INDEPENDENT_AMBULATORY_CARE_PROVIDER_SITE_OTHER): Payer: BC Managed Care – PPO | Admitting: Internal Medicine

## 2012-08-03 ENCOUNTER — Encounter: Payer: Self-pay | Admitting: Internal Medicine

## 2012-08-03 VITALS — BP 132/78 | HR 99 | Temp 97.8°F | Resp 12 | Ht 71.0 in | Wt 231.0 lb

## 2012-08-03 DIAGNOSIS — E119 Type 2 diabetes mellitus without complications: Secondary | ICD-10-CM

## 2012-08-03 DIAGNOSIS — E669 Obesity, unspecified: Secondary | ICD-10-CM

## 2012-08-03 DIAGNOSIS — I10 Essential (primary) hypertension: Secondary | ICD-10-CM

## 2012-08-03 DIAGNOSIS — E785 Hyperlipidemia, unspecified: Secondary | ICD-10-CM

## 2012-08-03 MED ORDER — ASPIRIN 81 MG PO CHEW: 81.0000 mg | CHEWABLE_TABLET | Freq: Every day | ORAL | Status: DC

## 2012-08-03 MED ORDER — MOMETASONE FUROATE 0.1 % EX OINT: TOPICAL_OINTMENT | Freq: Every day | CUTANEOUS | Status: DC

## 2012-08-03 MED ORDER — FLUTICASONE PROPIONATE 50 MCG/ACT NA SUSP: 2.0000 | Freq: Every day | NASAL | Status: DC

## 2012-08-03 NOTE — Patient Instructions (Addendum)
Please return for fasting labs  Please start taking a baby aspirin daily  Please ask [facility] to send me the a1c or any other labs they did recently

## 2012-08-03 NOTE — Assessment & Plan Note (Addendum)
improved control over the last 3 months per patient report of hgba1c being 6.4   .  Records requested.  No changes to meds today except adding baby aspirin daily

## 2012-08-03 NOTE — Assessment & Plan Note (Signed)
Repeat lipids needed, continue pravastatin for now

## 2012-08-03 NOTE — Assessment & Plan Note (Addendum)
Well controlled on current regimen. Renal function pending, no changes today. 

## 2012-08-03 NOTE — Progress Notes (Addendum)
Patient ID: Joshua Arellano, male   DOB: 07/05/1966, 46 y.o.   MRN: [id number]  Patient Active Problem List  Diagnosis  . Diabetes mellitus type 2 in obese  . Dyslipidemia, goal LDL below 70  . Hypertension  . Rectal bleeding  . Hemorrhoid  . Knee pain, left  . Obesity (BMI 30-39.9)  . Palpable undescended testes  . Routine general medical examination at a health care facility  . Hypokalemia    Subjective:  CC:   Chief Complaint  Patient presents with  . Follow-up    diabetes  . Otalgia    HPI:   Draysen Weygandt McCookis a 46 y.o. male who presents for 3 month follow up on diabetes mellitus.  Per patient, his most recent hgba1c was 6.4 .   He remains enrolled in the [facility] study,  He has noticed that his morning blood sugars are elevated unless he has a protein shake or protein bar . No numbness or tingling of feet  No recent low blood sugars.  He is up to date on eye exam.  He does not take aspirin daily.  Cc ear pain and pressure.  Symptoms are mild and improved with judicious use of afrin.   Past Medical History  Diagnosis Date  . Diabetes mellitus   . Hyperlipidemia   . Hypertension   . Obesity (BMI 30-39.9)   . Hemorrhoid   . Knee pain, left     episodic    History reviewed. No pertinent past surgical history.       The following portions of the patient's history were reviewed and updated as appropriate: Allergies, current medications, and problem list.    Review of Systems:   12 Pt  review of systems was negative except those addressed in the HPI,     History   Social History  . Marital Status: Married    Spouse Name: N/A    Number of Children: N/A  . Years of Education: N/A   Occupational History  . Not on file.   Social History Main Topics  . Smoking status: Never Smoker   . Smokeless tobacco: Never Used  . Alcohol Use: No     Comment: rare  . Drug Use: No  . Sexually Active: Not on file   Other Topics Concern  . Not on file   Social  History Narrative  . No narrative on file    Objective:  BP 132/78  Pulse 99  Temp 97.8 F (36.6 C) (Oral)  Resp 12  Ht 5\' 11"  (1.803 m)  Wt 231 lb (104.781 kg)  BMI 32.22 kg/m2  SpO2 96%  General appearance: alert, cooperative and appears stated age Ears: densel scarred  TM's and external ear canals both ears, no erythema Throat: lips, mucosa, and tongue normal; teeth and gums normal Neck: no cervical lymphadenopathy, no carotid bruit, supple, symmetrical, trachea midline and thyroid not enlarged, symmetric, no tenderness/mass/nodules Back: symmetric, no curvature. ROM normal. No CVA tenderness. Lungs: clear to auscultation bilaterally Heart: regular rate and rhythm, S1, S2 normal, no murmur, click, rub or gallop Abdomen: soft, non-tender; bowel sounds normal; no masses,  no organomegaly Pulses: 2+ and symmetric Skin: Skin color, texture, turgor normal. No rashes or lesions Foot exam:  Nails are well trimmed,  No callouses,  Sensation intact to microfilament. DP pulses palpable  Assessment and Plan:  Diabetes mellitus type 2 in obese improved control over the last 3 months per patient report of hgba1c being 6.4   .  Records requested.  No changes to meds today except adding baby aspirin daily   Dyslipidemia, goal LDL below 70 Repeat lipids needed, continue pravastatin for now  Hypertension Well controlled on current regimen. Renal function pending, no changes today.   Updated Medication List Outpatient Encounter Prescriptions as of 08/03/2012  Medication Sig Dispense Refill  . ACCU-CHEK COMPACT TEST DRUM test strip TEST TWICE DAILY  102 each  11  . amLODipine (NORVASC) 10 MG tablet Take 0.5 tablets (5 mg total) by mouth daily.  90 tablet  3  . glimepiride (AMARYL) 2 MG tablet Take 1 mg by mouth daily before breakfast.      . Liraglutide (VICTOZA) 18 MG/3ML SOLN Inject 1.2 mLs into the skin daily.      Marland Kitchen losartan (COZAAR) 100 MG tablet Take 1 tablet (100 mg total) by  mouth daily.  90 tablet  3  . metformin (GLUCOPHAGE-XR) 750 MG 24 hr tablet Two tablets by mouth every day  60 tablet  3  . aspirin (ASPIRIN CHILDRENS) 81 MG chewable tablet Chew 1 tablet (81 mg total) by mouth daily.  30 tablet  11  . fluticasone (FLONASE) 50 MCG/ACT nasal spray Place 2 sprays into the nose daily.  16 g  6  . hydrochlorothiazide (HYDRODIURIL) 25 MG tablet Take 1 tablet (25 mg total) by mouth daily.  90 tablet  3  . mometasone (ELOCON) 0.1 % ointment Apply topically daily.  45 g  0  . potassium chloride SA (K-DUR,KLOR-CON) 20 MEQ tablet Take 1 tablet (20 mEq total) by mouth daily.  5 tablet  0  . pravastatin (PRAVACHOL) 40 MG tablet Take 1 tablet (40 mg total) by mouth daily.  90 tablet  3     Orders Placed This Encounter  Procedures  . Lipid panel  . Microalbumin / creatinine urine ratio  . Comprehensive metabolic panel  . Hemoglobin A1c  . HM DIABETES EYE EXAM    No Follow-up on file.

## 2012-08-04 ENCOUNTER — Other Ambulatory Visit (INDEPENDENT_AMBULATORY_CARE_PROVIDER_SITE_OTHER): Payer: BC Managed Care – PPO

## 2012-08-04 DIAGNOSIS — E119 Type 2 diabetes mellitus without complications: Secondary | ICD-10-CM

## 2012-08-04 LAB — LIPID PANEL
HDL: 35.5 mg/dL — ABNORMAL LOW (ref >39.00)
LDL Cholesterol: 72 mg/dL (ref 0–99)
Total CHOL/HDL Ratio: 3
Triglycerides: 70 mg/dL (ref 0.0–149.0)
VLDL: 14 mg/dL (ref 0.0–40.0)

## 2012-08-04 LAB — COMPREHENSIVE METABOLIC PANEL
CO2: 27 mEq/L (ref 19–32)
Calcium: 9 mg/dL (ref 8.4–10.5)
Creatinine, Ser: 0.9 mg/dL (ref 0.4–1.5)
GFR: 90.86 mL/min (ref >60.00)
Glucose, Bld: 195 mg/dL — ABNORMAL HIGH (ref 70–99)
Total Bilirubin: 0.5 mg/dL (ref 0.3–1.2)

## 2012-08-04 LAB — HEMOGLOBIN A1C: Hgb A1c MFr Bld: 7.1 % — ABNORMAL HIGH (ref 4.6–6.5)

## 2012-09-19 ENCOUNTER — Other Ambulatory Visit: Payer: Self-pay

## 2012-09-19 LAB — HM DIABETES EYE EXAM: HM Diabetic Eye Exam: NORMAL

## 2012-10-08 ENCOUNTER — Encounter: Payer: Self-pay | Admitting: Adult Health

## 2012-10-08 ENCOUNTER — Ambulatory Visit (INDEPENDENT_AMBULATORY_CARE_PROVIDER_SITE_OTHER): Payer: BC Managed Care – PPO | Admitting: Adult Health

## 2012-10-08 ENCOUNTER — Telehealth: Payer: Self-pay | Admitting: Internal Medicine

## 2012-10-08 VITALS — BP 128/82 | HR 84 | Temp 97.7°F | Resp 16 | Ht 71.0 in | Wt 235.0 lb

## 2012-10-08 DIAGNOSIS — J329 Chronic sinusitis, unspecified: Secondary | ICD-10-CM

## 2012-10-08 DIAGNOSIS — Z888 Allergy status to other drugs, medicaments and biological substances status: Secondary | ICD-10-CM

## 2012-10-08 DIAGNOSIS — Z881 Allergy status to other antibiotic agents status: Secondary | ICD-10-CM | POA: Insufficient documentation

## 2012-10-08 MED ORDER — FLUTICASONE PROPIONATE 50 MCG/ACT NA SUSP: 2.0000 | Freq: Every day | NASAL | Status: DC

## 2012-10-08 MED ORDER — AMOXICILLIN-POT CLAVULANATE 875-125 MG PO TABS: 1.0000 | ORAL_TABLET | Freq: Two times a day (BID) | ORAL | Status: DC

## 2012-10-08 NOTE — Patient Instructions (Addendum)
  Please start your antibiotic today. You will take this for 7 days.  Also start the flonase nasal spray. 2 sprays in each nostril daily.  You can use Afrin nasal spray for congestion. Only use this medication for 3 days to avoid rebound stuffiness.

## 2012-10-08 NOTE — Assessment & Plan Note (Addendum)
Symptoms have been ongoing for greater than 3 weeks. Will start Augmentin x7 days. Also start Flonase nasal spray. May use Afrin x3 days for nasal congestion. Saline nasal spray may be used to flush sinuses.

## 2012-10-08 NOTE — Telephone Encounter (Signed)
Patient Information:  Caller Name: Londyn  Phone: [phone]  Patient: Joshua Arellano, Joshua Arellano  Gender: Male  DOB: July 10, 1966  Age: 47 Years  PCP: Duncan Dull (Adults only)   Does the office need to follow up with this patient?: No    Reason For Call & Symptoms: cold, with cough, worsening  Reviewed Health History In EMR: Yes  Reviewed Medications In EMR: Yes  Reviewed Allergies In EMR: Yes  Reviewed Surgeries / Procedures: Yes  Date of Onset of Symptoms: 10/04/2012  Guideline(s) Used:  Colds  Disposition Per Guideline:   See Today or Tomorrow in Office  Reason For Disposition Reached:  Patient wants to be seen  Appointment Scheduled:  10/08/2012 11:00:00; Scheduled Provider:  Orville Govern

## 2012-10-08 NOTE — Progress Notes (Signed)
Subjective:    Patient ID: Joshua Arellano, male    DOB: 1966/04/26, 47 y.o.   MRN: [id number]  HPI  Pt presents with URI symptoms which have been ongoing for 3 weeks. Symptoms are rhinorrhea, dark colored drainage, sinus congestion, coughing. He has also had a HA which began last night. He has not taken any over-the-counter products.   Current Outpatient Prescriptions on File Prior to Visit  Medication Sig Dispense Refill  . amLODipine (NORVASC) 10 MG tablet Take 0.5 tablets (5 mg total) by mouth daily.  90 tablet  3  . aspirin (ASPIRIN CHILDRENS) 81 MG chewable tablet Chew 1 tablet (81 mg total) by mouth daily.  30 tablet  11  . hydrochlorothiazide (HYDRODIURIL) 25 MG tablet Take 1 tablet (25 mg total) by mouth daily.  90 tablet  3  . Liraglutide (VICTOZA) 18 MG/3ML SOLN Inject 1.8 mg into the skin daily.       Marland Kitchen losartan (COZAAR) 100 MG tablet Take 1 tablet (100 mg total) by mouth daily.  90 tablet  3  . pravastatin (PRAVACHOL) 40 MG tablet Take 1 tablet (40 mg total) by mouth daily.  90 tablet  3  . ACCU-CHEK COMPACT TEST DRUM test strip TEST TWICE DAILY  102 each  11  . fluticasone (FLONASE) 50 MCG/ACT nasal spray Place 2 sprays into the nose daily.  16 g  6  . glimepiride (AMARYL) 2 MG tablet Take 1 mg by mouth daily before breakfast.      . metformin (GLUCOPHAGE-XR) 750 MG 24 hr tablet Two tablets by mouth every day  60 tablet  3  . mometasone (ELOCON) 0.1 % ointment Apply topically daily.  45 g  0  . potassium chloride SA (K-DUR,KLOR-CON) 20 MEQ tablet Take 1 tablet (20 mEq total) by mouth daily.  5 tablet  0   No current facility-administered medications on file prior to visit.      Review of Systems  Constitutional: Negative for fever, chills and appetite change.  HENT: Positive for congestion, rhinorrhea, postnasal drip and sinus pressure. Negative for sore throat.   Respiratory: Positive for cough and shortness of breath. Negative for wheezing.        Shortness of breath  with activity  Cardiovascular: Negative for chest pain and leg swelling.  Gastrointestinal: Negative for nausea, vomiting and diarrhea.  Neurological: Positive for headaches. Negative for dizziness and light-headedness.  Psychiatric/Behavioral: Negative.     BP 128/82  Pulse 84  Temp(Src) 97.7 F (36.5 C) (Oral)  Ht 5\' 11"  (1.803 m)  Wt 235 lb (106.595 kg)  BMI 32.79 kg/m2  SpO2 100%     Objective:   Physical Exam  Constitutional: He is oriented to person, place, and time. He appears well-developed and well-nourished.  Appears not feeling well.  HENT:  Head: Normocephalic and atraumatic.  Bilateral TM with scaring. Hx of multiple ear infections as a child. Pharynx is slightly erythematous. No exudate.  Neck: Normal range of motion. Neck supple. No tracheal deviation present.  Cardiovascular: Normal rate, regular rhythm, normal heart sounds and intact distal pulses.  Exam reveals no gallop and no friction rub.   No murmur heard. Pulmonary/Chest: Effort normal and breath sounds normal. No respiratory distress. He has no wheezes. He has no rales.  Abdominal: Soft. Bowel sounds are normal.  Lymphadenopathy:    He has no cervical adenopathy.  Neurological: He is alert and oriented to person, place, and time.  Psychiatric: He has a normal mood and affect.  His behavior is normal. Judgment and thought content normal.          Assessment & Plan:

## 2012-11-17 ENCOUNTER — Ambulatory Visit (INDEPENDENT_AMBULATORY_CARE_PROVIDER_SITE_OTHER): Payer: BC Managed Care – PPO | Admitting: Internal Medicine

## 2012-11-17 ENCOUNTER — Encounter: Payer: Self-pay | Admitting: Internal Medicine

## 2012-11-17 VITALS — BP 132/74 | HR 95 | Temp 98.1°F | Resp 14 | Wt 240.8 lb

## 2012-11-17 DIAGNOSIS — R5381 Other malaise: Secondary | ICD-10-CM

## 2012-11-17 DIAGNOSIS — Q539 Undescended testicle, unspecified: Secondary | ICD-10-CM

## 2012-11-17 DIAGNOSIS — I10 Essential (primary) hypertension: Secondary | ICD-10-CM

## 2012-11-17 DIAGNOSIS — E669 Obesity, unspecified: Secondary | ICD-10-CM

## 2012-11-17 DIAGNOSIS — M6250 Muscle wasting and atrophy, not elsewhere classified, unspecified site: Secondary | ICD-10-CM

## 2012-11-17 DIAGNOSIS — E119 Type 2 diabetes mellitus without complications: Secondary | ICD-10-CM

## 2012-11-17 LAB — COMPREHENSIVE METABOLIC PANEL
ALT: 56 U/L — ABNORMAL HIGH (ref 0–53)
AST: 37 U/L (ref 0–37)
Alkaline Phosphatase: 75 U/L (ref 39–117)
CO2: 26 mEq/L (ref 19–32)
Creatinine, Ser: 0.9 mg/dL (ref 0.4–1.5)
GFR: 99.23 mL/min (ref >60.00)
Total Bilirubin: 0.6 mg/dL (ref 0.3–1.2)

## 2012-11-17 LAB — CBC WITH DIFFERENTIAL/PLATELET
Basophils Absolute: 0 10*3/uL (ref 0.0–0.1)
Eosinophils Absolute: 0 10*3/uL (ref 0.0–0.7)
Hemoglobin: 14.2 g/dL (ref 13.0–17.0)
Lymphocytes Relative: 20.3 % (ref 12.0–46.0)
MCHC: 33.9 g/dL (ref 30.0–36.0)
Monocytes Relative: 7.5 % (ref 3.0–12.0)
Neutro Abs: 5.6 10*3/uL (ref 1.4–7.7)
Neutrophils Relative %: 71.3 % (ref 43.0–77.0)
RBC: 4.84 Mil/uL (ref 4.22–5.81)
RDW: 13.4 % (ref 11.5–14.6)

## 2012-11-17 LAB — HEMOGLOBIN A1C: Hgb A1c MFr Bld: 6.9 % — ABNORMAL HIGH (ref 4.6–6.5)

## 2012-11-17 LAB — TSH: TSH: 0.66 u[IU]/mL (ref 0.35–5.50)

## 2012-11-17 MED ORDER — ATORVASTATIN CALCIUM 20 MG PO TABS: 20.0000 mg | ORAL_TABLET | Freq: Every day | ORAL | Status: DC

## 2012-11-17 NOTE — Patient Instructions (Addendum)
Checking testosterone and thyroid function today.  We will call you with the lab results

## 2012-11-17 NOTE — Assessment & Plan Note (Signed)
I have addressed  BMI and recommended a low glycemic index diet utilizing smaller more frequent meals to increase metabolism.  I have also recommended that patient start exercising with a goal of 30 minutes of aerobic exercise a minimum of 5 days per week.  

## 2012-11-17 NOTE — Assessment & Plan Note (Addendum)
appt has not been rescheduled.      Needs testiclular ultrasound to check for undescended testes.

## 2012-11-17 NOTE — Assessment & Plan Note (Signed)
checking testosterone, thyroid and CBC

## 2012-11-17 NOTE — Assessment & Plan Note (Signed)
Well-controlled on current medications despite random glucose today of 278.  hemoglobin A1c is 6.9. He is up-to-date on eye exams and his foot exam is normal. l we'll repeat his urine microalbumin to creatinine ratio at next visit. He is on the appropriate medications.

## 2012-11-17 NOTE — Progress Notes (Addendum)
Patient ID: Joshua Arellano, male   DOB: [birth year], 47 y.o.   MRN: [id number]   Patient Active Problem List  Diagnosis  . Diabetes mellitus type 2 in obese  . Dyslipidemia, goal LDL below 70  . Hypertension  . Rectal bleeding  . Hemorrhoid  . Knee pain, left  . Obesity (BMI 30-39.9)  . Palpable undescended testes  . Routine general medical examination at a health care facility  . Hypokalemia  . Allergic reaction to Augmentin  . Other malaise and fatigue    Subjective:  CC:   Chief Complaint  Patient presents with  . Acute Visit    Mole on left arm, C/O feeling cold at 80 degree's    HPI:   MANSEL STROTHER a 47 y.o. male who presents for  3 month follow up on diabetes mellitus, hypertension and obesity.  His chief complaint is fatigue. 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   He is reporting feeling cold all of the time.  He feels he is not productive at work anymore.    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    Right side of  tongue  No symptoms. No history of trauma. It was firstt noticed on Sunday .  No history of tobacco use,  oral or inhaled.   4) Blood sugars , not checking them lately .  He is  Not exercising,  Got laid off from his job in March    Past Medical History  Diagnosis Date  . Diabetes mellitus   . Hyperlipidemia   . Hypertension   . Obesity (BMI 30-39.9)   . Hemorrhoid   . Knee pain, left     episodic    History reviewed. No pertinent past surgical history.     The following portions of the patient's history were reviewed and updated as appropriate: Allergies, current medications, and problem list.    Review of Systems:   Patient denies headache, fevers, malaise, unintentional weight loss, skin rash, eye pain, sinus congestion and sinus pain, sore throat, dysphagia,  hemoptysis , cough, dyspnea, wheezing, chest pain, palpitations,  orthopnea, edema, abdominal pain, nausea, melena, diarrhea, constipation, flank pain, dysuria, hematuria, urinary  Frequency, nocturia, numbness, tingling, seizures,  Focal weakness, Loss of consciousness,  Tremor, insomnia, depression, anxiety, and suicidal ideation.      History   Social History  . Marital Status: Married    Spouse Name: N/A    Number of Children: N/A  . Years of Education: N/A   Occupational History  . Not on file.   Social History Main Topics  . Smoking status: Never Smoker   . Smokeless tobacco: Never Used  . Alcohol Use: No     Comment: rare  . Drug Use: No  . Sexually Active: Not on file   Other Topics Concern  . Not on file   Social History Narrative  . No narrative on file    Objective:  BP 132/74  Pulse 95  Temp(Src) 98.1 F (36.7 C) (Oral)  Resp 14  Wt 240 lb 12 oz (109.203 kg)  BMI 33.59 kg/m2  SpO2 98%  General appearance: alert, cooperative and appears stated age Ears: normal TM's and external ear canals both ears Throat: lips, mucosa, and tongue normal; teeth and gums normal Neck: no adenopathy, no carotid bruit, supple, symmetrical, trachea midline and thyroid not enlarged, symmetric, no  tenderness/mass/nodules Back: symmetric, no curvature. ROM normal. No CVA tenderness. Lungs: clear to auscultation bilaterally Heart: regular rate and rhythm, S1, S2 normal, no murmur, click, rub or gallop Abdomen: soft, non-tender; bowel sounds normal; no masses,  no organomegaly Pulses: 2+ and symmetric Skin: Skin color, texture, turgor normal. No rashes or lesions Lymph nodes: Cervical, supraclavicular, and axillary nodes normal.  Assessment and Plan:  Palpable undescended testes appt has not been rescheduled.      Needs testiclular ultrasound to check for undescended testes.   Diabetes mellitus type 2 in obese Well-controlled on current medications despite random glucose today of 278.  hemoglobin A1c is 6.9. He is up-to-date on eye exams  and his foot exam is normal. l we'll repeat his urine microalbumin to creatinine ratio at next visit. He is on the appropriate medications.  Other malaise and fatigue checking testosterone, thyroid and CBC   Hypertension Well controlled on current regimen. Renal function stable, no changes today.  Obesity (BMI 30-39.9) I have addressed  BMI and recommended a low glycemic index diet utilizing smaller more frequent meals to increase metabolism.  I have also recommended that patient start exercising with a goal of 30 minutes of aerobic exercise a minimum of 5 days per week.    Updated Medication List Outpatient Encounter Prescriptions as of 11/17/2012  Medication Sig Dispense Refill  . ACCU-CHEK COMPACT TEST DRUM test strip TEST TWICE DAILY  102 each  11  . amLODipine (NORVASC) 10 MG tablet Take 0.5 tablets (5 mg total) by mouth daily.  90 tablet  3  . aspirin (ASPIRIN CHILDRENS) 81 MG chewable tablet Chew 1 tablet (81 mg total) by mouth daily.  30 tablet  11  . atorvastatin (LIPITOR) 20 MG tablet Take 1 tablet (20 mg total) by mouth daily.  90 tablet  3  . glimepiride (AMARYL) 2 MG tablet Take 1 mg by mouth daily before breakfast.      . Liraglutide (VICTOZA) 18 MG/3ML SOLN Inject 1.8 mg into the skin daily.       Marland Kitchen losartan (COZAAR) 100 MG tablet Take 1 tablet (100 mg total) by mouth daily.  90 tablet  3  . metformin (GLUCOPHAGE-XR) 750 MG 24 hr tablet Two tablets by mouth every day  60 tablet  3  . [DISCONTINUED] atorvastatin (LIPITOR) 40 MG tablet Take 20 mg by mouth daily.       Marland Kitchen amoxicillin-clavulanate (AUGMENTIN) 875-125 MG per tablet Take 1 tablet by mouth 2 (two) times daily.  14 tablet  0  . fluticasone (FLONASE) 50 MCG/ACT nasal spray Place 2 sprays into the nose daily.  16 g  6  . hydrochlorothiazide (HYDRODIURIL) 25 MG tablet Take 1 tablet (25 mg total) by mouth daily.  90 tablet  3  . mometasone (ELOCON) 0.1 % ointment Apply topically daily.  45 g  0  . potassium chloride SA  (K-DUR,KLOR-CON) 20 MEQ tablet Take 1 tablet (20 mEq total) by mouth daily.  5 tablet  0  . [DISCONTINUED] pravastatin (PRAVACHOL) 40 MG tablet Take 1 tablet (40 mg total) by mouth daily.  90 tablet  3   No facility-administered encounter medications on file as of 11/17/2012.     Orders Placed This Encounter  Procedures  . US Scrotum  . Hemoglobin A1c  . Comprehensive metabolic panel  . TSH  . CBC with Differential  . Testosterone, free, total  . HM DIABETES EYE EXAM    No Follow-up on file.

## 2012-11-17 NOTE — Assessment & Plan Note (Signed)
Well controlled on current regimen. Renal function stable, no changes today. 

## 2012-11-18 ENCOUNTER — Encounter: Payer: Self-pay | Admitting: Internal Medicine

## 2012-11-18 LAB — TESTOSTERONE, FREE, TOTAL, SHBG
Testosterone, Free: 61.1 pg/mL (ref 47.0–244.0)
Testosterone-% Free: 1.7 % (ref 1.6–2.9)

## 2012-11-19 ENCOUNTER — Ambulatory Visit: Payer: Self-pay | Admitting: Internal Medicine

## 2012-11-19 ENCOUNTER — Telehealth: Payer: Self-pay | Admitting: Internal Medicine

## 2012-11-19 NOTE — Telephone Encounter (Signed)
I received testicular ultrasound results on Mr Joshua Arellano.  The report stated:  Testicles appear to be in the scrotum rather than the inguinal canal.  There is some peripheral calcification normal left with some small peripheral cysts.  Normal blood flow is present.  No solid testicular masses are seen.  No hydrocele or varicocele.  I did not call pt with results.  It appeared that in your note, you were evaluating for undescended testicles.  I did not know if you were waiting on results.  Thanks.  Let me know if you need me to do anything.

## 2012-11-20 NOTE — Telephone Encounter (Signed)
Thanks  Charlene.  I have notified patient.

## 2012-12-02 ENCOUNTER — Other Ambulatory Visit: Payer: Self-pay | Admitting: *Deleted

## 2012-12-03 MED ORDER — AMLODIPINE BESYLATE 10 MG PO TABS: 5.0000 mg | ORAL_TABLET | Freq: Every day | ORAL | Status: DC

## 2012-12-03 NOTE — Telephone Encounter (Signed)
Rx sent to pharmacy by escript  

## 2012-12-11 ENCOUNTER — Encounter: Payer: Self-pay | Admitting: Internal Medicine

## 2012-12-16 ENCOUNTER — Other Ambulatory Visit: Payer: Self-pay | Admitting: *Deleted

## 2012-12-16 MED ORDER — LOSARTAN POTASSIUM 100 MG PO TABS: 100.0000 mg | ORAL_TABLET | Freq: Every day | ORAL | Status: DC

## 2013-01-28 ENCOUNTER — Encounter: Payer: Self-pay | Admitting: Internal Medicine

## 2013-01-29 ENCOUNTER — Other Ambulatory Visit: Payer: Self-pay | Admitting: *Deleted

## 2013-01-29 MED ORDER — LIRAGLUTIDE 18 MG/3ML ~~LOC~~ SOPN: 1.8000 mg | PEN_INJECTOR | Freq: Every day | SUBCUTANEOUS | Status: DC

## 2013-02-16 ENCOUNTER — Encounter: Payer: Self-pay | Admitting: Internal Medicine

## 2013-02-16 ENCOUNTER — Ambulatory Visit (INDEPENDENT_AMBULATORY_CARE_PROVIDER_SITE_OTHER): Payer: BC Managed Care – PPO | Admitting: Internal Medicine

## 2013-02-16 VITALS — BP 140/82 | HR 85 | Temp 97.7°F | Resp 14 | Wt 249.0 lb

## 2013-02-16 DIAGNOSIS — F3289 Other specified depressive episodes: Secondary | ICD-10-CM

## 2013-02-16 DIAGNOSIS — I10 Essential (primary) hypertension: Secondary | ICD-10-CM

## 2013-02-16 DIAGNOSIS — F32A Depression, unspecified: Secondary | ICD-10-CM | POA: Insufficient documentation

## 2013-02-16 DIAGNOSIS — E119 Type 2 diabetes mellitus without complications: Secondary | ICD-10-CM

## 2013-02-16 DIAGNOSIS — F329 Major depressive disorder, single episode, unspecified: Secondary | ICD-10-CM | POA: Insufficient documentation

## 2013-02-16 DIAGNOSIS — E669 Obesity, unspecified: Secondary | ICD-10-CM

## 2013-02-16 DIAGNOSIS — E785 Hyperlipidemia, unspecified: Secondary | ICD-10-CM

## 2013-02-16 LAB — MICROALBUMIN / CREATININE URINE RATIO: Creatinine,U: 31 mg/dL

## 2013-02-16 LAB — COMPREHENSIVE METABOLIC PANEL
ALT: 57 U/L — ABNORMAL HIGH (ref 0–53)
AST: 35 U/L (ref 0–37)
Albumin: 3.8 g/dL (ref 3.5–5.2)
Alkaline Phosphatase: 76 U/L (ref 39–117)
Potassium: 4.1 mEq/L (ref 3.5–5.1)
Sodium: 139 mEq/L (ref 135–145)
Total Protein: 7 g/dL (ref 6.0–8.3)

## 2013-02-16 LAB — HEMOGLOBIN A1C: Hgb A1c MFr Bld: 8.7 % — ABNORMAL HIGH (ref 4.6–6.5)

## 2013-02-16 LAB — LIPID PANEL
LDL Cholesterol: 85 mg/dL (ref 0–99)
Total CHOL/HDL Ratio: 4

## 2013-02-16 MED ORDER — INSULIN NPH (HUMAN) (ISOPHANE) 100 UNIT/ML ~~LOC~~ SUSP: 20.0000 [IU] | Freq: Two times a day (BID) | SUBCUTANEOUS | Status: DC

## 2013-02-16 MED ORDER — BUPROPION HCL 75 MG PO TABS: 75.0000 mg | ORAL_TABLET | Freq: Two times a day (BID) | ORAL | Status: DC

## 2013-02-16 NOTE — Progress Notes (Signed)
Patient ID: Joshua Arellano, male   DOB: [birth year], 47 y.o.   MRN: [id number]  Patient Active Problem List   Diagnosis Date Noted  . Depression (emotion) 02/16/2013  . Other malaise and fatigue 11/17/2012  . Allergic reaction to Augmentin 10/08/2012  . Hypokalemia 05/03/2012  . Palpable undescended testes 04/30/2012  . Routine general medical examination at a health care facility 04/30/2012  . Obesity (BMI 30-39.9) 07/11/2011  . Rectal bleeding 07/10/2011  . Hemorrhoid   . Knee pain, left   . Diabetes mellitus type 2 in obese 04/09/2011  . Dyslipidemia, goal LDL below 70 04/09/2011  . Hypertension 04/09/2011    Subjective:  CC:   Chief Complaint  Patient presents with  . Follow-up    DM    HPI:   Joshua Million McCookis a 47 y.o. male who presents  Past Medical History  Diagnosis Date  . Diabetes mellitus   . Hyperlipidemia   . Hypertension   . Obesity (BMI 30-39.9)   . Hemorrhoid   . Knee pain, left     episodic    History reviewed. No pertinent past surgical history.   The following portions of the patient's history were reviewed and updated as appropriate: Allergies, current medications, and problem list.    Review of Systems:   Patient denies headache, fevers, malaise, unintentional weight loss, skin rash, eye pain, sinus congestion and sinus pain, sore throat, dysphagia,  hemoptysis , cough, dyspnea, wheezing, chest pain, palpitations, orthopnea, edema, abdominal pain, nausea, melena, diarrhea, constipation, flank pain, dysuria, hematuria, urinary  Frequency, nocturia, numbness, tingling, seizures,  Focal weakness, Loss of consciousness,  Tremor,  anxiety, and suicidal ideation.     History   Social History  . Marital Status: Married    Spouse Name: N/A    Number of Children: N/A  . Years of Education: N/A   Occupational History  . Not on file.   Social History Main Topics  . Smoking status: Never Smoker   . Smokeless tobacco: Never Used  .  Alcohol Use: No     Comment: rare  . Drug Use: No  . Sexually Active: Not on file   Other Topics Concern  . Not on file   Social History Narrative  . No narrative on file    Objective:  BP 140/82  Pulse 85  Temp(Src) 97.7 F (36.5 C) (Oral)  Resp 14  Wt 249 lb (112.946 kg)  BMI 34.74 kg/m2  SpO2 97%  General appearance: alert, cooperative and appears stated age Ears: normal TM's and external ear canals both ears Throat: lips, mucosa, and tongue normal; teeth and gums normal Neck: no adenopathy, no carotid bruit, supple, symmetrical, trachea midline and thyroid not enlarged, symmetric, no tenderness/mass/nodules Back: symmetric, no curvature. ROM normal. No CVA tenderness. Lungs: clear to auscultation bilaterally Heart: regular rate and rhythm, S1, S2 normal, no murmur, click, rub or gallop Abdomen: soft, non-tender; bowel sounds normal; no masses,  no organomegaly Pulses: 2+ and symmetric Skin: Skin color, texture, turgor normal. No rashes or lesions Lymph nodes: Cervical, supraclavicular, and axillary nodes normal. Foot exam:  Nails are well trimmed,  No callouses,  Sensation intact to microfilament   Assessment and Plan:  Depression (emotion) With lack of motivation,  overeating and excessive sleeping. Aggravated by unemployment.  Trial of wellbutrin.  counsellign given., advise to structure day.   Dyslipidemia, goal LDL below 70 LDL 85 on current treamtent,  No changes.   Hypertension Well controlled on current regimen. Renal  function stable, no changes today.  Obesity (BMI 30-39.9) I have addressed  BMI and recommended a low glycemic index diet utilizing smaller more frequent meals to increase metabolism.  I have also recommended that patient start exercising with a goal of 30 minutes of aerobic exercise a minimum of 5 days per week.  Diabetes mellitus type 2 in obese Loss of control addressed, hgba1c now 8.7  Patient not checking blood sugars .  Taking  glimepiride, metfomrin and victoxa.  Wil recommend stopping victoza and adding NPH twice daily    Updated Medication List Outpatient Encounter Prescriptions as of 02/16/2013  Medication Sig Dispense Refill  . amLODipine (NORVASC) 10 MG tablet Take 0.5 tablets (5 mg total) by mouth daily.  90 tablet  1  . aspirin (ASPIRIN CHILDRENS) 81 MG chewable tablet Chew 1 tablet (81 mg total) by mouth daily.  30 tablet  11  . atorvastatin (LIPITOR) 20 MG tablet Take 1 tablet (20 mg total) by mouth daily.  90 tablet  3  . esomeprazole (NEXIUM) 20 MG capsule Take 20 mg by mouth daily before breakfast.      . glimepiride (AMARYL) 2 MG tablet Take 1 mg by mouth daily before breakfast.      . glucose blood test strip 1 each by Other route 2 (two) times daily. Use as instructed      . Insulin Pen Needle 31G X 8 MM MISC by Does not apply route.      Marland Kitchen losartan (COZAAR) 100 MG tablet Take 1 tablet (100 mg total) by mouth daily.  90 tablet  3  . metformin (GLUCOPHAGE-XR) 750 MG 24 hr tablet Two tablets by mouth every day  60 tablet  3  . Multiple Vitamins-Minerals (MULTIVITAMIN WITH MINERALS) tablet Take 1 tablet by mouth daily.      . [DISCONTINUED] ACCU-CHEK COMPACT TEST DRUM test strip TEST TWICE DAILY  102 each  11  . [DISCONTINUED] Liraglutide (VICTOZA) 18 MG/3ML SOLN Inject 1.8 mg into the skin daily.       . [DISCONTINUED] Liraglutide (VICTOZA) 18 MG/3ML SOPN Inject 1.8 mg into the skin daily.  3 pen  5  . buPROPion (WELLBUTRIN) 75 MG tablet Take 1 tablet (75 mg total) by mouth 2 (two) times daily.  60 tablet  3  . insulin NPH (NOVOLIN N) 100 UNIT/ML injection Inject 20 Units into the skin 2 (two) times daily at 8 am and 10 pm.  1 vial  12  . mometasone (ELOCON) 0.1 % ointment Apply topically daily.  45 g  0  . [DISCONTINUED] amoxicillin-clavulanate (AUGMENTIN) 875-125 MG per tablet Take 1 tablet by mouth 2 (two) times daily.  14 tablet  0  . [DISCONTINUED] fluticasone (FLONASE) 50 MCG/ACT nasal spray Place  2 sprays into the nose daily.  16 g  6  . [DISCONTINUED] hydrochlorothiazide (HYDRODIURIL) 25 MG tablet Take 1 tablet (25 mg total) by mouth daily.  90 tablet  3  . [DISCONTINUED] potassium chloride SA (K-DUR,KLOR-CON) 20 MEQ tablet Take 1 tablet (20 mEq total) by mouth daily.  5 tablet  0   No facility-administered encounter medications on file as of 02/16/2013.     Orders Placed This Encounter  Procedures  . Lipid panel  . Hemoglobin A1c  . Comprehensive metabolic panel  . Microalbumin / creatinine urine ratio  . HM DIABETES FOOT EXAM    No Follow-up on file.

## 2013-02-16 NOTE — Assessment & Plan Note (Signed)
LDL 85 on current treamtent,  No changes.

## 2013-02-16 NOTE — Assessment & Plan Note (Signed)
I have addressed  BMI and recommended a low glycemic index diet utilizing smaller more frequent meals to increase metabolism.  I have also recommended that patient start exercising with a goal of 30 minutes of aerobic exercise a minimum of 5 days per week.  

## 2013-02-16 NOTE — Assessment & Plan Note (Signed)
Well controlled on current regimen. Renal function stable, no changes today. 

## 2013-02-16 NOTE — Patient Instructions (Addendum)
Trial of wellbutrin for depression and lack of motivation  Take second dose by 1 pm if it interferes with sleep  I will e mail you results f labs

## 2013-02-16 NOTE — Assessment & Plan Note (Signed)
With lack of motivation,  overeating and excessive sleeping. Aggravated by unemployment.  Trial of wellbutrin.  counsellign given., advise to structure day.

## 2013-02-16 NOTE — Assessment & Plan Note (Addendum)
Loss of control addressed, hgba1c now 8.7  Patient not checking blood sugars .  Taking glimepiride, metfomrin and victoxa.  Wil recommend stopping victoza and adding NPH twice daily

## 2013-02-25 ENCOUNTER — Telehealth: Payer: Self-pay | Admitting: Internal Medicine

## 2013-04-12 ENCOUNTER — Other Ambulatory Visit: Payer: Self-pay | Admitting: Internal Medicine

## 2013-05-04 ENCOUNTER — Encounter: Payer: Self-pay | Admitting: Internal Medicine

## 2013-05-12 ENCOUNTER — Ambulatory Visit (INDEPENDENT_AMBULATORY_CARE_PROVIDER_SITE_OTHER): Payer: BC Managed Care – PPO | Admitting: Internal Medicine

## 2013-05-12 ENCOUNTER — Encounter: Payer: Self-pay | Admitting: Internal Medicine

## 2013-05-12 VITALS — BP 118/78 | HR 81 | Temp 97.6°F | Resp 14 | Ht 71.0 in | Wt 228.5 lb

## 2013-05-12 DIAGNOSIS — E785 Hyperlipidemia, unspecified: Secondary | ICD-10-CM

## 2013-05-12 DIAGNOSIS — E119 Type 2 diabetes mellitus without complications: Secondary | ICD-10-CM

## 2013-05-12 DIAGNOSIS — E669 Obesity, unspecified: Secondary | ICD-10-CM

## 2013-05-12 DIAGNOSIS — R350 Frequency of micturition: Secondary | ICD-10-CM

## 2013-05-12 DIAGNOSIS — Z23 Encounter for immunization: Secondary | ICD-10-CM

## 2013-05-12 DIAGNOSIS — E1169 Type 2 diabetes mellitus with other specified complication: Secondary | ICD-10-CM

## 2013-05-12 DIAGNOSIS — I10 Essential (primary) hypertension: Secondary | ICD-10-CM

## 2013-05-12 LAB — POCT URINALYSIS DIPSTICK
Bilirubin, UA: NEGATIVE
Glucose, UA: >=1000
Ketones, UA: NEGATIVE
Leukocytes, UA: NEGATIVE
Protein, UA: NEGATIVE

## 2013-05-12 MED ORDER — LOSARTAN POTASSIUM 50 MG PO TABS: 50.0000 mg | ORAL_TABLET | Freq: Every day | ORAL | Status: DC

## 2013-05-12 NOTE — Patient Instructions (Addendum)
Suspend the losartan  And follow  your blood pressure readings.if your blood pressure stays above 150 without it ,  Increase the amlodipine to 10 mg daily   Ask Dr Maple Hudson to check a CK (muscle enzyme) with your next blood draw

## 2013-05-12 NOTE — Progress Notes (Signed)
Patient ID: Joshua Arellano, male   DOB: [birth year], 47 y.o.   MRN: [id number]  Patient Active Problem List   Diagnosis Date Noted  . Depression (emotion) 02/16/2013  . Other malaise and fatigue 11/17/2012  . Allergic reaction to Augmentin 10/08/2012  . Palpable undescended testes 04/30/2012  . Routine general medical examination at a health care facility 04/30/2012  . Obesity (BMI 30-39.9) 07/11/2011  . Rectal bleeding 07/10/2011  . Hemorrhoid   . Knee pain, left   . Diabetes mellitus type 2 in obese 04/09/2011  . Dyslipidemia, goal LDL below 70 04/09/2011  . Hypertension 04/09/2011    Subjective:  CC:   Chief Complaint  Patient presents with  . Follow-up    On email, sulfur smell in urine, muscle weakness,    HPI:   Joshua Bondy McCookis a 47 y.o. male who presents for 3 month diabetes followup.   After receiving my e mail about the need to start insulin for uncontrolled DM,  he self referred to Dr Maple Hudson, the endocrinologist at  rather than starting the insulin .  She started invokanna and stopped the glipizide.  Started having dizziness and sweating and was worried that hispotassium had dropped.  It was high normal at 5.0 .. He has dropped 21 lbs through better diet and exercise. ,  Referred twice weekly soccer,  And walks the other days  Has occasional tunnel vision .  Has recurrent shin splints with walking at 3.7 miles per hour, not if he walks slower  Foot exam is normal   Having some muscle weakness and polyuria since starting the invokanna on august 6th   His fasting have been around or less than 125   Past Medical History  Diagnosis Date  . Diabetes mellitus   . Hyperlipidemia   . Hypertension   . Obesity (BMI 30-39.9)   . Hemorrhoid   . Knee pain, left     episodic    History reviewed. No pertinent past surgical history.     The following portions of the patient's history were reviewed and updated as appropriate: Allergies, current medications, and problem  list.    Review of Systems:   12 Pt  review of systems was negative except those addressed in the HPI,     History   Social History  . Marital Status: Married    Spouse Name: N/A    Number of Children: N/A  . Years of Education: N/A   Occupational History  . Not on file.   Social History Main Topics  . Smoking status: Never Smoker   . Smokeless tobacco: Never Used  . Alcohol Use: No     Comment: rare  . Drug Use: No  . Sexual Activity: Not on file   Other Topics Concern  . Not on file   Social History Narrative  . No narrative on file    Objective:  Filed Vitals:   05/12/13 0828  BP: 118/78  Pulse: 81  Temp: 97.6 F (36.4 C)  Resp: 14     General appearance: alert, cooperative and appears stated age Ears: normal TM's and external ear canals both ears Throat: lips, mucosa, and tongue normal; teeth and gums normal Neck: no adenopathy, no carotid bruit, supple, symmetrical, trachea midline and thyroid not enlarged, symmetric, no tenderness/mass/nodules Back: symmetric, no curvature. ROM normal. No CVA tenderness. Lungs: clear to auscultation bilaterally Heart: regular rate and rhythm, S1, S2 normal, no murmur, click, rub or gallop Abdomen: soft, non-tender; bowel sounds  normal; no masses,  no organomegaly Pulses: 2+ and symmetric Skin: Skin color, texture, turgor normal. No rashes or lesions Lymph nodes: Cervical, supraclavicular, and axillary nodes normal.  Assessment and Plan:  Dyslipidemia, goal LDL below 70 Lab Results  Component Value Date   CHOL 139 02/16/2013   HDL 33.00* 02/16/2013   LDLCALC 85 02/16/2013   LDLDIRECT 103.7 07/10/2011   TRIG 103.0 02/16/2013   CHOLHDL 4 02/16/2013   His most recent LDL was < 100 on low dose atorvastatin.  No changes today    Obesity (BMI 30-39.9) improvng with low glycemic index diet and exercise, .  17 lb weight loss since last visit.   Hypertension .Well controlled on current regimen. Renal function  stable, no changes today.  Diabetes mellitus type 2 in obese Improving with use of invokanna,  Never started the insulin. Foot exam is normal.    A total of 40 minutes was spent with patient more than half of which was spent in counseling, reviewing records from other prviders and coordination of care.  Updated Medication List Outpatient Encounter Prescriptions as of 05/12/2013  Medication Sig Dispense Refill  . aspirin (ASPIRIN CHILDRENS) 81 MG chewable tablet Chew 1 tablet (81 mg total) by mouth daily.  30 tablet  11  . atorvastatin (LIPITOR) 20 MG tablet Take 1 tablet (20 mg total) by mouth daily.  90 tablet  3  . buPROPion (WELLBUTRIN) 75 MG tablet Take 1 tablet (75 mg total) by mouth 2 (two) times daily.  60 tablet  3  . Canagliflozin 300 MG TABS Take 1 tablet by mouth daily.      Marland Kitchen glucose blood test strip 1 each by Other route 2 (two) times daily. Use as instructed      . metformin (GLUCOPHAGE-XR) 750 MG 24 hr tablet Two tablets by mouth every day  60 tablet  3  . mometasone (ELOCON) 0.1 % ointment Apply topically daily.  45 g  0  . Multiple Vitamins-Minerals (MULTIVITAMIN WITH MINERALS) tablet Take 1 tablet by mouth daily.      . [DISCONTINUED] amLODipine (NORVASC) 10 MG tablet Take 0.5 tablets (5 mg total) by mouth daily.  90 tablet  1  . [DISCONTINUED] Canagliflozin 100 MG TABS Take 1 tablet by mouth daily.      . [DISCONTINUED] losartan (COZAAR) 100 MG tablet Take 1 tablet (100 mg total) by mouth daily.  90 tablet  3  . amLODipine (NORVASC) 5 MG tablet TAKE 1 TABLET BY MOUTH DAILY  90 tablet  1  . esomeprazole (NEXIUM) 20 MG capsule Take 20 mg by mouth daily before breakfast.      . Insulin Pen Needle 31G X 8 MM MISC by Does not apply route.      . [DISCONTINUED] glimepiride (AMARYL) 2 MG tablet Take 1 mg by mouth daily before breakfast.      . [DISCONTINUED] insulin NPH (NOVOLIN N) 100 UNIT/ML injection Inject 20 Units into the skin 2 (two) times daily at 8 am and 10 pm.  1 vial   12  . [DISCONTINUED] losartan (COZAAR) 50 MG tablet Take 1 tablet (50 mg total) by mouth daily.  90 tablet  3   No facility-administered encounter medications on file as of 05/12/2013.     Orders Placed This Encounter  Procedures  . Flu Vaccine QUAD 36+ mos PF IM (Fluarix)  . POCT Urinalysis Dipstick    No Follow-up on file.

## 2013-05-13 ENCOUNTER — Encounter: Payer: Self-pay | Admitting: Internal Medicine

## 2013-05-13 NOTE — Assessment & Plan Note (Signed)
Improving with use of invokanna,  Never started the insulin. Foot exam is normal.

## 2013-05-13 NOTE — Assessment & Plan Note (Signed)
Well controlled on current regimen. Renal function stable, no changes today. 

## 2013-05-13 NOTE — Assessment & Plan Note (Signed)
improvng with low glycemic index diet and exercise, .  17 lb weight loss since last visit.

## 2013-05-13 NOTE — Assessment & Plan Note (Addendum)
Lab Results  Component Value Date   CHOL 139 02/16/2013   HDL 33.00* 02/16/2013   LDLCALC 85 02/16/2013   LDLDIRECT 103.7 07/10/2011   TRIG 103.0 02/16/2013   CHOLHDL 4 02/16/2013   His most recent LDL was < 100 on low dose atorvastatin.  No changes today

## 2013-05-18 ENCOUNTER — Encounter: Payer: Self-pay | Admitting: Internal Medicine

## 2013-05-18 DIAGNOSIS — S86899A Other injury of other muscle(s) and tendon(s) at lower leg level, unspecified leg, initial encounter: Secondary | ICD-10-CM

## 2013-05-27 ENCOUNTER — Other Ambulatory Visit (INDEPENDENT_AMBULATORY_CARE_PROVIDER_SITE_OTHER): Payer: BC Managed Care – PPO

## 2013-05-27 ENCOUNTER — Ambulatory Visit (INDEPENDENT_AMBULATORY_CARE_PROVIDER_SITE_OTHER): Payer: BC Managed Care – PPO | Admitting: Family Medicine

## 2013-05-27 ENCOUNTER — Encounter: Payer: Self-pay | Admitting: Family Medicine

## 2013-05-27 VITALS — BP 138/82 | HR 88 | Ht 71.0 in | Wt 227.0 lb

## 2013-05-27 DIAGNOSIS — M25569 Pain in unspecified knee: Secondary | ICD-10-CM

## 2013-05-27 DIAGNOSIS — T798XXA Other early complications of trauma, initial encounter: Secondary | ICD-10-CM

## 2013-05-27 DIAGNOSIS — IMO0002 Reserved for concepts with insufficient information to code with codable children: Secondary | ICD-10-CM | POA: Insufficient documentation

## 2013-05-27 NOTE — Patient Instructions (Signed)
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  Compression sleeves with activity [email or web address]  Size large Spenco orthotics at [employer] Look for shoes for overpronators and have a more solid sole.  Walk run protocol, 5/80minutes for total 30 minutes.  Only run twice a week.  I would rather you run on grass or track when possible.  Calf raises on a step  1 set of 30 first week daily, then 2sets daily for a week then 3 sets daily therafter.  Walk or run in grass barefoot 1 time a week as well to help with feeling in foot.  Walk on toes across rooom 10 x forward and backward  Walk on heel forward 10x Come back again in 4 weeks.

## 2013-05-27 NOTE — Assessment & Plan Note (Signed)
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 Patient does give a history that he is able to run on grass which makes this likely more of a mild case. I am optimistic he will improve quickly. Over-the-counter orthotics to decrease the amount of force Discuss compression sleeve Discuss stretching protocol Discussed how icing can be beneficial. Visual followup for 4 weeks. If he continues to have pain we'll consider doing custom orthotics.

## 2013-05-27 NOTE — Progress Notes (Signed)
  I'm seeing this patient by the request  of:  TULLO,TERESA, MD   CC: shin pain.  HPI: Patient is a very pleasant 47 year old gentleman coming in with complaints of shin pain. Patient states that he has had this pain for probably 20 years. Patient states that he has tried doing different exercises over the course of time and does enjoy running but unfortunately after running 2 miles or so he starts having significant pain on the anterior part of her shins. Patient states that this can cause some radiation into his feet which does not cause numbness but feels that his feet become floppy. Patient states he usually has to stop and within 10 minutes of stopping the pain totally resolved. Patient does not have this pain with regular activity and only has pain with running on cement. 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 Patient denies any knee pain, denies any injury to the lower extremities. Denies any weakness. Patient states that he does have the pain can be 7-8/10   Past medical, surgical, family and social history reviewed. Medications reviewed all in the electronic medical record.   Review of Systems: No headache, visual changes, nausea, vomiting, diarrhea, constipation, dizziness, abdominal pain, skin rash, fevers, chills, night sweats, weight loss, swollen lymph nodes, body aches, joint swelling, muscle aches, chest pain, shortness of breath, mood changes.   Objective:    Height 5\' 11"  (1.803 m), weight 227 lb (102.967 kg).   General: No apparent distress alert and oriented x3 mood and affect normal, dressed appropriately.  HEENT: Pupils equal, extraocular movements intact Respiratory: Patient's speak in full sentences and does not appear short of breath Cardiovascular: No lower extremity edema, non tender, no erythema Skin: Warm dry intact with no signs of infection or rash on extremities or on axial skeleton. Abdomen: Soft  nontender Neuro: Cranial nerves II through XII are intact, neurovascularly intact in all extremities with 2+ DTRs and 2+ pulses. Lymph: No lymphadenopathy of posterior or anterior cervical chain or axillae bilaterally.  Gait normal with good balance and coordination.  MSK: Non tender with full range of motion and good stability and symmetric strength and tone of shoulders, elbows, wrist, hip, and ankles bilaterally.  Knee: Bilaterally Normal to inspection with no erythema or effusion or obvious bony abnormalities. Palpation normal with no warmth, joint line tenderness, patellar tenderness, or condyle tenderness. ROM full in flexion and extension and lower leg rotation. Ligaments with solid consistent endpoints including ACL, PCL, LCL, MCL. Negative Mcmurray's, Apley's, and Thessalonian tests. Non painful patellar compression. Patellar glide without crepitus. Patellar and quadriceps tendons unremarkable. Hamstring and quadriceps strength is normal.  Exam of calves bilaterally shows the patient does have hypertrophy of the anterior tibialis muscle. On palpation he is nontender and is neurovascularly intact distally. Achilles reflexes 2+ and symmetric.  Musculoskeletal ultrasound was performed and interpreted by Antoine Primas, M today. Limited ultrasound shows mild scarring of the overlying fascia at the junction of the lateral and anterior compartment. There is some mild hypoechoic changes in this area. No true tear appreciated in the muscles.  Impression and Recommendations:     This case required medical decision making of moderate complexity.

## 2013-06-22 ENCOUNTER — Encounter: Payer: Self-pay | Admitting: Family Medicine

## 2013-06-24 ENCOUNTER — Ambulatory Visit: Payer: BC Managed Care – PPO | Admitting: Family Medicine

## 2013-07-09 ENCOUNTER — Encounter: Payer: Self-pay | Admitting: Family Medicine

## 2013-07-21 ENCOUNTER — Ambulatory Visit: Payer: BC Managed Care – PPO | Admitting: Family Medicine

## 2013-08-02 ENCOUNTER — Encounter: Payer: Self-pay | Admitting: Internal Medicine

## 2013-08-03 ENCOUNTER — Other Ambulatory Visit: Payer: Self-pay | Admitting: Internal Medicine

## 2013-08-04 MED ORDER — ATORVASTATIN CALCIUM 20 MG PO TABS: 20.0000 mg | ORAL_TABLET | Freq: Every day | ORAL | Status: DC

## 2013-08-12 ENCOUNTER — Ambulatory Visit (INDEPENDENT_AMBULATORY_CARE_PROVIDER_SITE_OTHER): Payer: BC Managed Care – PPO | Admitting: Family Medicine

## 2013-08-12 ENCOUNTER — Encounter: Payer: Self-pay | Admitting: Family Medicine

## 2013-08-12 DIAGNOSIS — M7660 Achilles tendinitis, unspecified leg: Secondary | ICD-10-CM

## 2013-08-12 DIAGNOSIS — Z5189 Encounter for other specified aftercare: Secondary | ICD-10-CM

## 2013-08-12 DIAGNOSIS — M7661 Achilles tendinitis, right leg: Secondary | ICD-10-CM

## 2013-08-12 MED ORDER — MELOXICAM 15 MG PO TABS: 15.0000 mg | ORAL_TABLET | Freq: Every day | ORAL | Status: DC

## 2013-08-12 NOTE — Assessment & Plan Note (Signed)
Patient was doing very well when he was on a good regimen. At this time we'll get him to start his regular activity he can give him some meloxicam for pain relief. Patient will start this as well start a very safe for any progression. Patient will come back if he starts having pain. At that time I would consider the next have been custom orthotics.

## 2013-08-12 NOTE — Assessment & Plan Note (Signed)
He lists given the patient today. Patient given other home exercises which will be better and discussed with ice protocol. Patient will try these interventions and come back again if the pain does not seem to resolve. Patient does seem very at but it and hopefully with the loss of weight most of his pain will go away as well.

## 2013-08-12 NOTE — Patient Instructions (Signed)
Good to see you and happy new year! Try the compression sleeves after activity for 30 minutes Please try to run on softer surfaces most days of the week or walk.  Keep doing what you are doing and keep the exercises going at least 3 times a week.  meloxicam daily for 10 days then as needed.  Ice baths for 10-20 minutes can help as well.  Start a walk-run progression: - I would like you to do line drills (try to keep foot on a line when jogging). - Initially start one minute walking than one minute running for 20 mins in the first week,   then 25 mins during the second week, then 30 mins afterwards.  Once you have reached 30 mins: - Run 2 mins, then walk 1 min. -Then run 3 mins, and walk 1 min. -Then run 4 mins, and walk 1 min. -Then run 5 mins, and walk 1 min. -Slowly build up weekly to running 30 mins nonstop.  If painful at any of the steps, back up one step. Start this regimen if having pain or what have discussed increasing activity and please come back for another appointment. When making an appointment please tell them it would be for orthotics and 30 minute.

## 2013-08-12 NOTE — Progress Notes (Signed)
CC: shin pain follow up  HPI: Patient is a very pleasant 48 year old gentleman coming in with complaints of shin pain follow up. Patient was diagnosed with exertional compartment syndrome. I did see patient 3 months ago and given different exercises as well as over-the-counter exercises. Patient was also recommended to do compression sleeve as well as icing. Patient states about 85% better. Patient started to attend to do some light jogging and started to have some more discomfort most of the left side. Patient states though as long as he slowed down he seemed to do very well. Patient also states that he is not do the exercises on a regular basis. Patient did get the over-the-counter orthotics and states that they are helpful. Patient does have some mild Achilles pain on the right side. He's noticed this since he started his new shoes. Otherwise no new symptoms.   Past medical, surgical, family and social history reviewed. Medications reviewed all in the electronic medical record.   Review of Systems: No headache, visual changes, nausea, vomiting, diarrhea, constipation, dizziness, abdominal pain, skin rash, fevers, chills, night sweats, weight loss, swollen lymph nodes, body aches, joint swelling, muscle aches, chest pain, shortness of breath, mood changes.   Objective:    Blood pressure 136/72, pulse 82, temperature 97.1 F (36.2 C), temperature source Oral, resp. rate 16, weight 218 lb 0.6 oz (98.902 kg), SpO2 97.00%.   General: No apparent distress alert and oriented x3 mood and affect normal, dressed appropriately.  HEENT: Pupils equal, extraocular movements intact Respiratory: Patient's speak in full sentences and does not appear short of breath Cardiovascular: No lower extremity edema, non tender, no erythema Skin: Warm dry intact with no signs of infection or rash on extremities or on axial skeleton. Abdomen: Soft nontender Neuro: Cranial nerves II through XII are intact, neurovascularly  intact in all extremities with 2+ DTRs and 2+ pulses. Lymph: No lymphadenopathy of posterior or anterior cervical chain or axillae bilaterally.  Gait normal with good balance and coordination.  MSK: Non tender with full range of motion and good stability and symmetric strength and tone of shoulders, elbows, wrist, hip, and ankles bilaterally.  Knee: Bilaterally Normal to inspection with no erythema or effusion or obvious bony abnormalities. Palpation normal with no warmth, joint line tenderness, patellar tenderness, or condyle tenderness. ROM full in flexion and extension and lower leg rotation. Ligaments with solid consistent endpoints including ACL, PCL, LCL, MCL. Negative Mcmurray's, Apley's, and Thessalonian tests. Non painful patellar compression. Patellar glide without crepitus. Patellar and quadriceps tendons unremarkable. Hamstring and quadriceps strength is normal.  Exam of calves bilaterally shows the patient does have hypertrophy of the anterior tibialis muscle.  Right ankle also has some mild tenderness in Achilles at its insertion. No bony deformity noted. No swelling noted. His forward motion of the ankle. On palpation he is nontender and is neurovascularly intact distally. Achilles reflexes 2+ and symmetric.   Impression and Recommendations:     This case required medical decision making of moderate complexity.

## 2013-09-01 ENCOUNTER — Telehealth: Payer: Self-pay | Admitting: Internal Medicine

## 2013-09-01 NOTE — Telephone Encounter (Signed)
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  Placed in Dr. Melina Schoolsullo's box.  Pt states to call with any questions.  Fax# at top of paperwork, please fax.

## 2013-09-02 NOTE — Telephone Encounter (Signed)
Faxed to [phone].

## 2013-09-30 ENCOUNTER — Other Ambulatory Visit: Payer: Self-pay | Admitting: Internal Medicine

## 2013-11-03 ENCOUNTER — Encounter: Payer: Self-pay | Admitting: Internal Medicine

## 2013-11-03 ENCOUNTER — Other Ambulatory Visit: Payer: Self-pay | Admitting: Internal Medicine

## 2013-11-03 ENCOUNTER — Ambulatory Visit (INDEPENDENT_AMBULATORY_CARE_PROVIDER_SITE_OTHER): Payer: BC Managed Care – PPO | Admitting: Internal Medicine

## 2013-11-03 VITALS — BP 164/88 | HR 94 | Temp 97.8°F | Resp 18 | Wt 210.2 lb

## 2013-11-03 DIAGNOSIS — R200 Anesthesia of skin: Secondary | ICD-10-CM

## 2013-11-03 DIAGNOSIS — E669 Obesity, unspecified: Secondary | ICD-10-CM

## 2013-11-03 DIAGNOSIS — E1169 Type 2 diabetes mellitus with other specified complication: Secondary | ICD-10-CM

## 2013-11-03 DIAGNOSIS — E119 Type 2 diabetes mellitus without complications: Secondary | ICD-10-CM

## 2013-11-03 DIAGNOSIS — R209 Unspecified disturbances of skin sensation: Secondary | ICD-10-CM

## 2013-11-03 MED ORDER — MELOXICAM 15 MG PO TABS: 15.0000 mg | ORAL_TABLET | Freq: Every day | ORAL | Status: DC

## 2013-11-03 NOTE — Progress Notes (Signed)
Pre-visit discussion using our clinic review tool. No additional management support is needed unless otherwise documented below in the visit note.  

## 2013-11-03 NOTE — Progress Notes (Signed)
Patient ID: Joshua Arellano, male   DOB: 1966-03-27, 48 y.o.   MRN: [id number]   Patient Active Problem List   Diagnosis Date Noted  . Achilles tendinosis 08/12/2013  . 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 Depression (emotion) 02/16/2013  . Other malaise and fatigue 11/17/2012  . Allergic reaction to Augmentin 10/08/2012  . Palpable undescended testes 04/30/2012  . Routine general medical examination at a health care facility 04/30/2012  . Obesity (BMI 30-39.9) 07/11/2011  . Rectal bleeding 07/10/2011  . Hemorrhoid   . Knee pain, left   . Type II or unspecified type diabetes mellitus without mention of complication, uncontrolled 04/09/2011  . Dyslipidemia, goal LDL below 70 04/09/2011  . Hypertension 04/09/2011    Subjective:  CC:   Chief Complaint  Patient presents with  . Acute Visit    left 2 second phalange  . Diabetes    wants A1c checked    HPI:   Joshua Arellano is a 48 y.o. male who presents for Numbness of the tip of the left hand index finger. Dm follow up.  Has been taking victoza and  Which has been prescribed by Wylie Hail endocrinologiste at [facility]  Sugars averaging 140 .  Wt loss significant  Since last visit by following a low GI diet   Past Medical History  Diagnosis Date  . Diabetes mellitus   . Hyperlipidemia   . Hypertension   . Obesity (BMI 30-39.9)   . Hemorrhoid   . Knee pain, left     episodic    No past surgical history on file.     The following portions of the patient's history were reviewed and updated as appropriate: Allergies, current medications, and problem list.    Review of Systems:   Patient denies headache, fevers, malaise, unintentional weight loss, skin rash, eye pain, sinus congestion and sinus pain, sore throat, dysphagia,  hemoptysis , cough, dyspnea, wheezing, chest pain, palpitations, orthopnea, edema, abdominal pain, nausea, melena, diarrhea, constipation, flank pain, dysuria, hematuria, urinary   Frequency, nocturia, numbness, tingling, seizures,  Focal weakness, Loss of consciousness,  Tremor, insomnia, depression, anxiety, and suicidal ideation.     History   Social History  . Marital Status: Married    Spouse Name: N/A    Number of Children: N/A  . Years of Education: N/A   Occupational History  . Not on file.   Social History Main Topics  . Smoking status: Never Smoker   . Smokeless tobacco: Never Used  . Alcohol Use: No     Comment: rare  . Drug Use: No  . Sexual Activity: Not on file   Other Topics Concern  . Not on file   Social History Narrative  . No narrative on file    Objective:  Filed Vitals:   11/03/13 1626  BP: 164/88  Pulse: 94  Temp: 97.8 F (36.6 C)  Resp: 18     General appearance: alert, cooperative and appears stated age Ears: normal TM's and external ear canals both ears Throat: lips, mucosa, and tongue normal; teeth and gums normal Neck: no adenopathy, no carotid bruit, supple, symmetrical, trachea midline and thyroid not enlarged, symmetric, no tenderness/mass/nodules Back: symmetric, no curvature. ROM normal. No CVA tenderness. Lungs: clear to auscultation bilaterally Heart: regular rate and rhythm, S1, S2 normal, no murmur, click, rub or gallop Abdomen: soft, non-tender; bowel sounds normal; no masses,  no organomegaly Pulses: 2+ and symmetric Skin: Skin color, texture, turgor normal. No rashes or lesions Lymph  nodes: Cervical, supraclavicular, and axillary nodes normal.  Assessment and Plan: Type II or unspecified type diabetes mellitus without mention of complication, uncontrolled Improving with use of invokanna,  But not at goal.   Lab Results  Component Value Date   HGBA1C 8.4* 11/03/2013   Never started the insulin. Foot exam is normal. Will recommend starting bedgtime insulin to get fasting sugars < 125    Obesity (BMI 30-39.9) I have congratulated him in reduction of   BMI and encouraged  Continued weight loss  with goal of 10% of body weigh over the next 6 months using a low glycemic index diet and regular exercise a minimum of 5 days per week.    Numbness of finger Etiology unclear,  Since there has been no trauma or overuse phenomenon.  Reassurance provided.    Updated Medication List Outpatient Encounter Prescriptions as of 11/03/2013  Medication Sig  . amLODipine (NORVASC) 5 MG tablet TAKE 1 TABLET BY MOUTH EVERY DAY  . atorvastatin (LIPITOR) 20 MG tablet Take 1 tablet (20 mg total) by mouth daily.  Marland Kitchen. BAYER CONTOUR TEST test strip USE AS DIRECTED  . Canagliflozin 300 MG TABS Take 1 tablet by mouth daily.  Marland Kitchen. CHILDRENS ASPIRIN 81 MG chewable tablet CHEW AND SWALLOW ONE TABLET EVERY DAY  . Insulin Pen Needle 31G X 8 MM MISC by Does not apply route.  . metformin (GLUCOPHAGE-XR) 750 MG 24 hr tablet Two tablets by mouth every day  . mometasone (ELOCON) 0.1 % ointment Apply topically daily.  . Multiple Vitamins-Minerals (MULTIVITAMIN WITH MINERALS) tablet Take 1 tablet by mouth daily.  Marland Kitchen. buPROPion (WELLBUTRIN) 75 MG tablet Take 1 tablet (75 mg total) by mouth 2 (two) times daily.  . [DISCONTINUED] meloxicam (MOBIC) 15 MG tablet Take 1 tablet (15 mg total) by mouth daily.  . [DISCONTINUED] meloxicam (MOBIC) 15 MG tablet Take 1 tablet (15 mg total) by mouth daily.

## 2013-11-04 ENCOUNTER — Encounter: Payer: Self-pay | Admitting: Internal Medicine

## 2013-11-04 LAB — MICROALBUMIN / CREATININE URINE RATIO
Creatinine,U: 37.1 mg/dL
MICROALB UR: 0.2 mg/dL (ref 0.0–1.9)
Microalb Creat Ratio: 0.5 mg/g (ref 0.0–30.0)

## 2013-11-04 LAB — COMPREHENSIVE METABOLIC PANEL
ALT: 30 U/L (ref 0–53)
AST: 29 U/L (ref 0–37)
Albumin: 4.4 g/dL (ref 3.5–5.2)
Alkaline Phosphatase: 76 U/L (ref 39–117)
BILIRUBIN TOTAL: 0.8 mg/dL (ref 0.3–1.2)
BUN: 10 mg/dL (ref 6–23)
CO2: 25 meq/L (ref 19–32)
Calcium: 9.8 mg/dL (ref 8.4–10.5)
Chloride: 103 mEq/L (ref 96–112)
Creatinine, Ser: 1 mg/dL (ref 0.4–1.5)
GFR: 80.44 mL/min (ref >60.00)
Glucose, Bld: 153 mg/dL — ABNORMAL HIGH (ref 70–99)
POTASSIUM: 4.2 meq/L (ref 3.5–5.1)
SODIUM: 139 meq/L (ref 135–145)
TOTAL PROTEIN: 7.3 g/dL (ref 6.0–8.3)

## 2013-11-04 LAB — HEMOGLOBIN A1C: Hgb A1c MFr Bld: 8.4 % — ABNORMAL HIGH (ref 4.6–6.5)

## 2013-11-05 NOTE — Assessment & Plan Note (Signed)
Etiology unclear,  Since there has been no trauma or overuse phenomenon.  Reassurance provided.

## 2013-11-05 NOTE — Assessment & Plan Note (Signed)
I have congratulated him in reduction of   BMI and encouraged  Continued weight loss with goal of 10% of body weigh over the next 6 months using a low glycemic index diet and regular exercise a minimum of 5 days per week.   

## 2013-11-05 NOTE — Assessment & Plan Note (Signed)
Improving with use of invokanna,  But not at goal.   Lab Results  Component Value Date   HGBA1C 8.4* 11/03/2013   Never started the insulin. Foot exam is normal. Will recommend starting bedgtime insulin to get fasting sugars < 125

## 2013-11-18 ENCOUNTER — Other Ambulatory Visit: Payer: Self-pay | Admitting: Internal Medicine

## 2013-11-22 ENCOUNTER — Telehealth: Payer: Self-pay

## 2013-11-22 NOTE — Telephone Encounter (Signed)
Relevant patient education assigned to patient using Emmi. ° °

## 2013-11-25 ENCOUNTER — Encounter: Payer: Self-pay | Admitting: Internal Medicine

## 2013-11-25 DIAGNOSIS — R200 Anesthesia of skin: Secondary | ICD-10-CM

## 2014-02-08 ENCOUNTER — Telehealth: Payer: Self-pay | Admitting: *Deleted

## 2014-02-08 DIAGNOSIS — IMO0001 Reserved for inherently not codable concepts without codable children: Secondary | ICD-10-CM

## 2014-02-08 DIAGNOSIS — E1165 Type 2 diabetes mellitus with hyperglycemia: Principal | ICD-10-CM

## 2014-02-08 NOTE — Telephone Encounter (Signed)
Patient has an appointment a1c ordered Diabetic bundle

## 2014-02-23 ENCOUNTER — Other Ambulatory Visit: Payer: Self-pay | Admitting: Internal Medicine

## 2014-02-23 MED ORDER — ATORVASTATIN CALCIUM 20 MG PO TABS: ORAL_TABLET | ORAL | Status: DC

## 2014-03-11 ENCOUNTER — Encounter: Payer: Self-pay | Admitting: *Deleted

## 2014-03-11 ENCOUNTER — Other Ambulatory Visit: Payer: Self-pay | Admitting: *Deleted

## 2014-03-11 DIAGNOSIS — IMO0001 Reserved for inherently not codable concepts without codable children: Secondary | ICD-10-CM

## 2014-03-11 DIAGNOSIS — E1165 Type 2 diabetes mellitus with hyperglycemia: Principal | ICD-10-CM

## 2014-03-21 ENCOUNTER — Encounter: Payer: Self-pay | Admitting: Internal Medicine

## 2014-03-21 ENCOUNTER — Telehealth: Payer: Self-pay | Admitting: Internal Medicine

## 2014-03-21 NOTE — Telephone Encounter (Signed)
Left message for patient to return call to office. 

## 2014-03-21 NOTE — Telephone Encounter (Signed)
Pt called in and stated would like a copy of immunization records.

## 2014-03-22 LAB — HEMOGLOBIN A1C: Hgb A1c MFr Bld: 7.4 % — AB (ref 4.0–6.0)

## 2014-03-22 NOTE — Telephone Encounter (Signed)
Have copy of Immunizations we have on file have placed at front desk for pick up. Left message for patient to call office.

## 2014-04-04 ENCOUNTER — Ambulatory Visit: Payer: Self-pay

## 2014-04-05 ENCOUNTER — Encounter: Payer: Self-pay | Admitting: Internal Medicine

## 2014-04-08 ENCOUNTER — Encounter: Payer: Self-pay | Admitting: Internal Medicine

## 2014-04-08 ENCOUNTER — Ambulatory Visit (INDEPENDENT_AMBULATORY_CARE_PROVIDER_SITE_OTHER): Payer: BC Managed Care – PPO | Admitting: Internal Medicine

## 2014-04-08 VITALS — BP 140/88 | HR 94 | Temp 97.9°F | Resp 16 | Ht 71.0 in | Wt 213.8 lb

## 2014-04-08 DIAGNOSIS — Z1211 Encounter for screening for malignant neoplasm of colon: Secondary | ICD-10-CM

## 2014-04-08 DIAGNOSIS — T23109A Burn of first degree of unspecified hand, unspecified site, initial encounter: Secondary | ICD-10-CM

## 2014-04-08 DIAGNOSIS — T23101A Burn of first degree of right hand, unspecified site, initial encounter: Secondary | ICD-10-CM

## 2014-04-08 MED ORDER — AMLODIPINE BESYLATE 5 MG PO TABS: ORAL_TABLET | ORAL | Status: DC

## 2014-04-08 MED ORDER — ATORVASTATIN CALCIUM 20 MG PO TABS: ORAL_TABLET | ORAL | Status: DC

## 2014-04-08 MED ORDER — ASPIRIN 81 MG PO CHEW: CHEWABLE_TABLET | ORAL | Status: DC

## 2014-04-08 MED ORDER — CEPHALEXIN 500 MG PO CAPS: 500.0000 mg | ORAL_CAPSULE | Freq: Four times a day (QID) | ORAL | Status: DC

## 2014-04-08 NOTE — Progress Notes (Signed)
Pre-visit discussion using our clinic review tool. No additional management support is needed unless otherwise documented below in the visit note.  
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
## 2014-04-11 ENCOUNTER — Encounter: Payer: Self-pay | Admitting: Internal Medicine

## 2014-04-11 DIAGNOSIS — T23109A Burn of first degree of unspecified hand, unspecified site, initial encounter: Secondary | ICD-10-CM | POA: Insufficient documentation
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
## 2014-06-17 ENCOUNTER — Other Ambulatory Visit: Payer: BC Managed Care – PPO

## 2014-06-20 ENCOUNTER — Ambulatory Visit: Payer: BC Managed Care – PPO | Admitting: Internal Medicine

## 2014-07-08 ENCOUNTER — Encounter: Payer: Self-pay | Admitting: Internal Medicine

## 2014-07-08 ENCOUNTER — Ambulatory Visit (INDEPENDENT_AMBULATORY_CARE_PROVIDER_SITE_OTHER): Payer: BC Managed Care – PPO | Admitting: Internal Medicine

## 2014-07-08 VITALS — BP 118/78 | HR 86 | Temp 98.5°F | Wt 212.0 lb

## 2014-07-08 DIAGNOSIS — J029 Acute pharyngitis, unspecified: Secondary | ICD-10-CM

## 2014-07-08 DIAGNOSIS — J069 Acute upper respiratory infection, unspecified: Secondary | ICD-10-CM

## 2014-07-08 LAB — POCT RAPID STREP A (OFFICE): Rapid Strep A Screen: NEGATIVE

## 2014-07-08 NOTE — Addendum Note (Signed)
Addended by: Roena MaladyEVONTENNO, Cantrell Martus Y on: 07/08/2014 04:25 PM   Modules accepted: Orders

## 2014-07-08 NOTE — Progress Notes (Signed)
Pre visit review using our clinic review tool, if applicable. No additional management support is needed unless otherwise documented below in the visit note. 

## 2014-07-08 NOTE — Patient Instructions (Signed)

## 2014-07-08 NOTE — Progress Notes (Signed)
Subjective:    Patient ID: Joshua Arellano, male    DOB: [birth year], 48 y.o.   MRN: [id number]  HPI  Pt presents to the clinic today with c/o runny nose, sore throat and cough. He reports this started 4 days ago. The cough is non productive. He reports his throat feels raw and he has had difficulty swallowing. He denies fever, chills or body aches. He has not tried anything OTC. He has no history of seasonal allergies. He has had sick contacts.  Review of Systems      Past Medical History  Diagnosis Date  . Diabetes mellitus   . Hyperlipidemia   . Hypertension   . Obesity (BMI 30-39.9)   . Hemorrhoid   . Knee pain, left     episodic    Current Outpatient Prescriptions  Medication Sig Dispense Refill  . amLODipine (NORVASC) 5 MG tablet TAKE 1 TABLET BY MOUTH EVERY DAY 90 tablet 1  . aspirin (CHILDRENS ASPIRIN) 81 MG chewable tablet CHEW AND SWALLOW ONE TABLET EVERY DAY 90 tablet 2  . atorvastatin (LIPITOR) 20 MG tablet TAKE 1 TABLET BY MOUTH EVERY DAY 90 tablet 0  . BAYER CONTOUR TEST test strip USE AS DIRECTED 150 each 1  . Canagliflozin 300 MG TABS Take 1 tablet by mouth daily.    . Insulin Pen Needle 31G X 8 MM MISC by Does not apply route.    . meloxicam (MOBIC) 15 MG tablet TAKE 1 TABLET BY MOUTH DAILY 90 tablet 0  . metFORMIN (GLUCOPHAGE-XR) 750 MG 24 hr tablet Take 2,250 mg by mouth daily with breakfast. Two tablets by mouth every day    . mometasone (ELOCON) 0.1 % ointment Apply topically daily. 45 g 0  . Multiple Vitamins-Minerals (MULTIVITAMIN WITH MINERALS) tablet Take 1 tablet by mouth daily.     No current facility-administered medications for this visit.    No Known Allergies  Family History  Problem Relation Age of Onset  . Cancer Maternal Uncle   . Stroke Father   . Heart disease Maternal Grandfather   . Hypertension Maternal Grandfather   . Hypertension Paternal Grandmother   . Hypertension Paternal Grandfather     History   Social History   . Marital Status: Married    Spouse Name: N/A    Number of Children: N/A  . Years of Education: N/A   Occupational History  . Not on file.   Social History Main Topics  . Smoking status: Never Smoker   . Smokeless tobacco: Never Used  . Alcohol Use: No     Comment: rare  . Drug Use: No  . Sexual Activity: Not on file   Other Topics Concern  . Not on file   Social History Narrative     Constitutional: Denies fever, malaise, fatigue, headache or abrupt weight changes.  HEENT: Pt reports runny nose, sore throat. Denies eye pain, eye redness, ear pain, ringing in the ears, wax buildup, nasal congestion, bloody nose. Respiratory: Pt reports cough. Denies difficulty breathing, shortness of breath or sputum production.   Cardiovascular: Denies chest pain, chest tightness, palpitations or swelling in the hands or feet.    No other specific complaints in a complete review of systems (except as listed in HPI above).  Objective:   Physical Exam   BP 118/78 mmHg  Pulse 86  Temp(Src) 98.5 F (36.9 C) (Oral)  Wt 212 lb (96.163 kg)  SpO2 98% Wt Readings from Last 3 Encounters:  07/08/14 212 lb (  96.163 kg)  04/08/14 213 lb 12 oz (96.956 kg)  11/03/13 210 lb 4 oz (95.369 kg)    General: Appears his stated age, well developed, well nourished in NAD. HEENT: Head: normal shape and size; Ears: Tm's gray and intact, normal light reflex; Nose: mucosa pink and moist, septum midline; Throat/Mouth: Teeth present, mucosa erythematous and moist, no exudate, lesions or ulcerations noted. No adenopathy noted. Cardiovascular: Normal rate and rhythm. S1,S2 noted.  No murmur, rubs or gallops noted.  Pulmonary/Chest: Normal effort and positive vesicular breath sounds. No respiratory distress. No wheezes, rales or ronchi noted.    BMET    Component Value Date/Time   NA 139 11/03/2013 1656   K 4.2 11/03/2013 1656   CL 103 11/03/2013 1656   CO2 25 11/03/2013 1656   GLUCOSE 153* 11/03/2013  1656   BUN 10 11/03/2013 1656   CREATININE 1.0 11/03/2013 1656   CREATININE 0.93 10/08/2011 1011   CALCIUM 9.8 11/03/2013 1656   GFRNONAA >89 10/08/2011 1011   GFRAA >89 10/08/2011 1011    Lipid Panel     Component Value Date/Time   CHOL 139 02/16/2013 0857   TRIG 103.0 02/16/2013 0857   HDL 33.00* 02/16/2013 0857   CHOLHDL 4 02/16/2013 0857   VLDL 20.6 02/16/2013 0857   LDLCALC 85 02/16/2013 0857    CBC    Component Value Date/Time   WBC 7.9 11/17/2012 1158   RBC 4.84 11/17/2012 1158   HGB 14.2 11/17/2012 1158   HCT 41.9 11/17/2012 1158   PLT 339.0 11/17/2012 1158   MCV 86.7 11/17/2012 1158   MCHC 33.9 11/17/2012 1158   RDW 13.4 11/17/2012 1158   LYMPHSABS 1.6 11/17/2012 1158   MONOABS 0.6 11/17/2012 1158   EOSABS 0.0 11/17/2012 1158   BASOSABS 0.0 11/17/2012 1158    Hgb A1C Lab Results  Component Value Date   HGBA1C 7.4* 03/22/2014        Assessment & Plan:   Viral URI:  Supportive care at this time Rest, fluids Ibuprofen and salt water gargles for the throat RST: negative  Watch for fever, colored mucous

## 2014-07-25 ENCOUNTER — Other Ambulatory Visit (INDEPENDENT_AMBULATORY_CARE_PROVIDER_SITE_OTHER): Payer: BC Managed Care – PPO

## 2014-07-25 DIAGNOSIS — E1165 Type 2 diabetes mellitus with hyperglycemia: Secondary | ICD-10-CM

## 2014-07-25 DIAGNOSIS — IMO0002 Reserved for concepts with insufficient information to code with codable children: Secondary | ICD-10-CM

## 2014-07-25 LAB — COMPREHENSIVE METABOLIC PANEL
ALT: 23 U/L (ref 0–53)
AST: 20 U/L (ref 0–37)
Albumin: 4 g/dL (ref 3.5–5.2)
Alkaline Phosphatase: 69 U/L (ref 39–117)
BUN: 11 mg/dL (ref 6–23)
CO2: 28 meq/L (ref 19–32)
Calcium: 9.3 mg/dL (ref 8.4–10.5)
Chloride: 103 mEq/L (ref 96–112)
Creatinine, Ser: 0.9 mg/dL (ref 0.4–1.5)
GFR: 96 mL/min (ref >60.00)
Glucose, Bld: 151 mg/dL — ABNORMAL HIGH (ref 70–99)
Potassium: 4.3 mEq/L (ref 3.5–5.1)
Sodium: 138 mEq/L (ref 135–145)
Total Bilirubin: 0.6 mg/dL (ref 0.2–1.2)
Total Protein: 6.8 g/dL (ref 6.0–8.3)

## 2014-07-25 LAB — LIPID PANEL
CHOL/HDL RATIO: 5
Cholesterol: 150 mg/dL (ref 0–200)
HDL: 30.4 mg/dL — ABNORMAL LOW (ref >39.00)
LDL Cholesterol: 100 mg/dL — ABNORMAL HIGH (ref 0–99)
NONHDL: 119.6
Triglycerides: 99 mg/dL (ref 0.0–149.0)
VLDL: 19.8 mg/dL (ref 0.0–40.0)

## 2014-07-25 LAB — HEMOGLOBIN A1C: HEMOGLOBIN A1C: 8.2 % — AB (ref 4.6–6.5)

## 2014-07-26 ENCOUNTER — Ambulatory Visit: Payer: BC Managed Care – PPO | Admitting: Internal Medicine

## 2014-07-27 ENCOUNTER — Encounter: Payer: Self-pay | Admitting: Internal Medicine

## 2014-09-05 ENCOUNTER — Other Ambulatory Visit: Payer: Self-pay | Admitting: Internal Medicine

## 2014-09-05 MED ORDER — ATORVASTATIN CALCIUM 20 MG PO TABS: ORAL_TABLET | ORAL | Status: DC

## 2014-10-28 ENCOUNTER — Other Ambulatory Visit: Payer: Self-pay | Admitting: Internal Medicine

## 2014-11-07 LAB — HEMOGLOBIN A1C: Hgb A1c MFr Bld: 7.1 % — AB (ref 4.0–6.0)

## 2014-12-08 ENCOUNTER — Other Ambulatory Visit: Payer: Self-pay | Admitting: Internal Medicine

## 2014-12-08 MED ORDER — ATORVASTATIN CALCIUM 20 MG PO TABS: ORAL_TABLET | ORAL | Status: DC

## 2014-12-23 ENCOUNTER — Encounter: Payer: Self-pay | Admitting: Internal Medicine

## 2014-12-23 ENCOUNTER — Ambulatory Visit (INDEPENDENT_AMBULATORY_CARE_PROVIDER_SITE_OTHER): Payer: BC Managed Care – PPO | Admitting: Internal Medicine

## 2014-12-23 VITALS — BP 148/86 | HR 89 | Temp 98.3°F | Resp 14 | Ht 71.0 in | Wt 209.5 lb

## 2014-12-23 DIAGNOSIS — E119 Type 2 diabetes mellitus without complications: Secondary | ICD-10-CM | POA: Diagnosis not present

## 2014-12-23 DIAGNOSIS — Z9989 Dependence on other enabling machines and devices: Secondary | ICD-10-CM

## 2014-12-23 DIAGNOSIS — E669 Obesity, unspecified: Secondary | ICD-10-CM

## 2014-12-23 DIAGNOSIS — G4733 Obstructive sleep apnea (adult) (pediatric): Secondary | ICD-10-CM | POA: Diagnosis not present

## 2014-12-23 DIAGNOSIS — I10 Essential (primary) hypertension: Secondary | ICD-10-CM | POA: Diagnosis not present

## 2014-12-23 MED ORDER — ATORVASTATIN CALCIUM 20 MG PO TABS: ORAL_TABLET | ORAL | Status: DC

## 2014-12-23 NOTE — Progress Notes (Signed)
Subjective:  Patient ID: Joshua Arellano, male    DOB: 13-May-1966  Age: 49 y.o. MRN: [id number]  CC: The primary encounter diagnosis was Essential hypertension. Diagnoses of Diabetes mellitus without complication, Obesity (BMI 30-39.9), and OSA on CPAP were also pertinent to this visit.  HPI [facility] presents for follow  up on DM Type 2.  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    a1c was 7.1 6 weeks ago, [facility],   BP elevated due to stress of work,  Home stressors include expected death of his father in law recently and his wife's difficulty in managing her grief. Davy Pique has not been sleeping well.   Home bps have been 120/80 until recently  Outpatient Prescriptions Prior to Visit  Medication Sig Dispense Refill  . amLODipine (NORVASC) 5 MG tablet TAKE 1 TABLET BY MOUTH EVERY DAY 90 tablet 1  . aspirin (CHILDRENS ASPIRIN) 81 MG chewable tablet CHEW AND SWALLOW ONE TABLET EVERY DAY 90 tablet 2  . BAYER CONTOUR TEST test strip USE AS DIRECTED 150 each 1  . Canagliflozin 300 MG TABS Take 1 tablet by mouth daily.    . Insulin Pen Needle 31G X 8 MM MISC by Does not apply route.    . Multiple Vitamins-Minerals (MULTIVITAMIN WITH MINERALS) tablet Take 1 tablet by mouth daily.    Marland Kitchen atorvastatin (LIPITOR) 20 MG tablet TAKE 1 TABLET BY MOUTH EVERY DAY 30 tablet 0  . metFORMIN (GLUCOPHAGE-XR) 750 MG 24 hr tablet Take 2,000 mg by mouth daily with breakfast. Two tablets by mouth every day    . mometasone (ELOCON) 0.1 % ointment Apply topically daily. (Patient not taking: Reported on 12/23/2014) 45 g 0   No facility-administered medications prior to visit.    Review of Systems;  Patient denies headache, fevers, malaise, unintentional weight loss, skin rash, eye pain, sinus congestion and sinus pain, sore throat, dysphagia,  hemoptysis , cough, dyspnea, wheezing, chest pain, palpitations, orthopnea, edema, abdominal pain, nausea,  melena, diarrhea, constipation, flank pain, dysuria, hematuria, urinary  Frequency, nocturia, numbness, tingling, seizures,  Focal weakness, Loss of consciousness,  Tremor, insomnia, depression, anxiety, and suicidal ideation.      Objective:  BP 148/86 mmHg  Pulse 89  Temp(Src) 98.3 F (36.8 C) (Oral)  Resp 14  Ht _0  (1.803 m)  Wt 209 lb 8 oz (95.029 kg)  BMI 29.23 kg/m2  SpO2 97%  BP Readings from Last 3 Encounters:  12/23/14 148/86  07/08/14 118/78  04/08/14 140/88    Wt Readings from Last 3 Encounters:  12/23/14 209 lb 8 oz (95.029 kg)  07/08/14 212 lb (96.163 kg)  04/08/14 213 lb 12 oz (96.956 kg)    General appearance: alert, cooperative and appears stated age Ears: normal TM's and external ear canals both ears Throat: lips, mucosa, and tongue normal; teeth and gums normal Neck: no adenopathy, no carotid bruit, supple, symmetrical, trachea midline and thyroid not enlarged, symmetric, no tenderness/mass/nodules Back: symmetric, no curvature. ROM normal. No CVA tenderness. Lungs: clear to auscultation bilaterally Heart: regular rate and rhythm, S1, S2 normal, no murmur, click, rub or gallop Abdomen: soft, non-tender; bowel sounds normal; no masses,  no organomegaly Pulses: 2+ and symmetric Skin: Skin color, texture, turgor normal. No rashes or lesions Lymph nodes: Cervical, supraclavicular, and axillary nodes normal.  Lab Results  Component Value Date   HGBA1C 7.1* 11/07/2014   HGBA1C 8.2* 07/25/2014   HGBA1C 7.4*  03/22/2014    Lab Results  Component Value Date   CREATININE 0.95 12/23/2014   CREATININE 0.9 07/25/2014   CREATININE 1.0 11/03/2013     Assessment & Plan:   Problem List Items Addressed This Visit    Diabetes mellitus without complication    Improving with use of invokanna, metformin and liraglutide   Managed by [facility] endocrinology.  he has no proteinuria by current urinalysis.  No changes today.  Reminder for annual eye exam given.  Lab  Results  Component Value Date   HGBA1C 8.2* 07/25/2014   Lab Results  Component Value Date   MICROALBUR <0.2 12/23/2014   Lab Results  Component Value Date   CHOL 150 07/25/2014   HDL 30.40* 07/25/2014   LDLCALC 100* 07/25/2014   LDLDIRECT 82 12/23/2014   TRIG 99.0 07/25/2014   CHOLHDL 5 07/25/2014      Foot exam is normal today       Relevant Medications   VICTOZA 18 MG/3ML SOPN   metFORMIN (GLUCOPHAGE-XR) 500 MG 24 hr tablet   atorvastatin (LIPITOR) 20 MG tablet   Other Relevant Orders   Comp Met (CMET) (Completed)   Direct LDL (Completed)   Urine Microalbumin w/creat. ratio (Completed)   Hypertension - Primary    Elevated today.  Reviewed list of meds, patient is not taking OTC meds that could be causing,. It.  Have asked patient to increase amlodipine to 10 mg daily, call readings to office for adjustment of medications.        Relevant Medications   atorvastatin (LIPITOR) 20 MG tablet   Other Relevant Orders   Comp Met (CMET) (Completed)   Direct LDL (Completed)   Urine Microalbumin w/creat. ratio (Completed)   Obesity (BMI 30-39.9)    I have congratulated him in reduction of   BMI and encouraged  Continued weight loss with goal of 10% of body weight over the next 6 months using a low glycemic index diet and regular exercise a minimum of 5 days per week.  Diet reviewed in detail and suggestions made.        Relevant Medications   VICTOZA 18 MG/3ML SOPN   metFORMIN (GLUCOPHAGE-XR) 500 MG 24 hr tablet   OSA on CPAP      I am having Mr. Brokaw maintain his mometasone, multivitamin with minerals, Insulin Pen Needle, canagliflozin, BAYER CONTOUR TEST, aspirin, amLODipine, VICTOZA, metFORMIN, and atorvastatin.  Meds ordered this encounter  Medications  . VICTOZA 18 MG/3ML SOPN    Sig: Inject 1.8 mg into the skin daily.    Refill:  1  . metFORMIN (GLUCOPHAGE-XR) 500 MG 24 hr tablet    Sig: Take 2,000 mg by mouth daily.    Refill:  4  . atorvastatin  (LIPITOR) 20 MG tablet    Sig: TAKE 1 TABLET BY MOUTH EVERY DAY    Dispense:  90 tablet    Refill:  2    Medications Discontinued During This Encounter  Medication Reason  . metFORMIN (GLUCOPHAGE-XR) 750 MG 24 hr tablet Change in therapy  . atorvastatin (LIPITOR) 20 MG tablet Reorder    Follow-up: Return in about 6 months (around 06/25/2015).   Crecencio Mc, MD

## 2014-12-23 NOTE — Patient Instructions (Addendum)
Your blood pressure is elevated.  Please Increase the amlodipine to 10 mg daily for now,  Continue 10 mg daily unless BP drops below 110/70 or you develop swollen legs (fluid retention)   E mail me when new rx needed  Try these lower carb healthy snacks:   Try almond milk Light by silk in original formula   For non dairy calcium supplement and  tastes great  Low carb cereal   From [employer] called   heritage Flakes  Add fresh blueberries    Substitute mini sweet peppers for your chips    Continue your meloxicam for another month unless gastritis develops

## 2014-12-24 LAB — COMPREHENSIVE METABOLIC PANEL
ALT: 21 U/L (ref 0–53)
AST: 18 U/L (ref 0–37)
Albumin: 4.3 g/dL (ref 3.5–5.2)
Alkaline Phosphatase: 63 U/L (ref 39–117)
BILIRUBIN TOTAL: 0.5 mg/dL (ref 0.2–1.2)
BUN: 10 mg/dL (ref 6–23)
CHLORIDE: 102 meq/L (ref 96–112)
CO2: 27 mEq/L (ref 19–32)
CREATININE: 0.95 mg/dL (ref 0.50–1.35)
Calcium: 9.9 mg/dL (ref 8.4–10.5)
GLUCOSE: 94 mg/dL (ref 70–99)
POTASSIUM: 4.3 meq/L (ref 3.5–5.3)
SODIUM: 140 meq/L (ref 135–145)
Total Protein: 6.6 g/dL (ref 6.0–8.3)

## 2014-12-24 LAB — MICROALBUMIN / CREATININE URINE RATIO: CREATININE, URINE: 40.9 mg/dL

## 2014-12-24 LAB — LDL CHOLESTEROL, DIRECT: LDL DIRECT: 82 mg/dL

## 2014-12-25 DIAGNOSIS — G4733 Obstructive sleep apnea (adult) (pediatric): Secondary | ICD-10-CM | POA: Insufficient documentation

## 2014-12-25 NOTE — Assessment & Plan Note (Signed)
I have congratulated him in reduction of   BMI and encouraged  Continued weight loss with goal of 10% of body weight over the next 6 months using a low glycemic index diet and regular exercise a minimum of 5 days per week.  Diet reviewed in detail and suggestions made.

## 2014-12-25 NOTE — Assessment & Plan Note (Signed)
Elevated today.  Reviewed list of meds, patient is not taking OTC meds that could be causing,. It.  Have asked patient to increase amlodipine to 10 mg daily, call readings to office for adjustment of medications.

## 2014-12-25 NOTE — Assessment & Plan Note (Addendum)
Improving with use of invokanna, metformin and liraglutide   [facility].  he has no proteinuria by current urinalysis.  No changes today.  Reminder for annual eye exam given.  Lab Results  Component Value Date   HGBA1C 8.2* 07/25/2014   Lab Results  Component Value Date   MICROALBUR <0.2 12/23/2014   Lab Results  Component Value Date   CHOL 150 07/25/2014   HDL 30.40* 07/25/2014   LDLCALC 100* 07/25/2014   LDLDIRECT 82 12/23/2014   TRIG 99.0 07/25/2014   CHOLHDL 5 07/25/2014      Foot exam is normal today

## 2014-12-28 ENCOUNTER — Encounter: Payer: Self-pay | Admitting: Internal Medicine

## 2015-01-08 ENCOUNTER — Encounter: Payer: Self-pay | Admitting: Internal Medicine

## 2015-01-09 ENCOUNTER — Telehealth: Payer: Self-pay | Admitting: Internal Medicine

## 2015-01-09 ENCOUNTER — Other Ambulatory Visit: Payer: Self-pay | Admitting: Internal Medicine

## 2015-01-09 DIAGNOSIS — R5383 Other fatigue: Secondary | ICD-10-CM

## 2015-01-09 DIAGNOSIS — I10 Essential (primary) hypertension: Secondary | ICD-10-CM

## 2015-01-09 DIAGNOSIS — Z125 Encounter for screening for malignant neoplasm of prostate: Secondary | ICD-10-CM

## 2015-01-09 DIAGNOSIS — E785 Hyperlipidemia, unspecified: Secondary | ICD-10-CM

## 2015-01-09 DIAGNOSIS — E119 Type 2 diabetes mellitus without complications: Secondary | ICD-10-CM

## 2015-01-09 MED ORDER — AMLODIPINE BESYLATE 10 MG PO TABS: 10.0000 mg | ORAL_TABLET | Freq: Every day | ORAL | Status: DC

## 2015-01-09 NOTE — Telephone Encounter (Signed)
Pt wanted to know if he needed labs prior to physical appt. No labs ordered in the system. Please advise.msn

## 2015-01-10 NOTE — Telephone Encounter (Signed)
Last labs CMET, LDL and Microalbumin on 12/23/14. Last A1c 11/07/14. Please advise labs needed?

## 2015-02-01 ENCOUNTER — Other Ambulatory Visit: Payer: Self-pay | Admitting: *Deleted

## 2015-02-01 ENCOUNTER — Other Ambulatory Visit: Payer: Self-pay | Admitting: Internal Medicine

## 2015-02-08 LAB — HM DIABETES EYE EXAM

## 2015-02-09 ENCOUNTER — Other Ambulatory Visit (INDEPENDENT_AMBULATORY_CARE_PROVIDER_SITE_OTHER): Payer: BC Managed Care – PPO

## 2015-02-09 ENCOUNTER — Encounter: Payer: Self-pay | Admitting: *Deleted

## 2015-02-09 DIAGNOSIS — R5383 Other fatigue: Secondary | ICD-10-CM | POA: Diagnosis not present

## 2015-02-09 DIAGNOSIS — E119 Type 2 diabetes mellitus without complications: Secondary | ICD-10-CM | POA: Diagnosis not present

## 2015-02-09 DIAGNOSIS — I10 Essential (primary) hypertension: Secondary | ICD-10-CM | POA: Diagnosis not present

## 2015-02-09 DIAGNOSIS — E785 Hyperlipidemia, unspecified: Secondary | ICD-10-CM | POA: Diagnosis not present

## 2015-02-09 DIAGNOSIS — Z125 Encounter for screening for malignant neoplasm of prostate: Secondary | ICD-10-CM

## 2015-02-09 LAB — CBC WITH DIFFERENTIAL/PLATELET
Basophils Absolute: 0 10*3/uL (ref 0.0–0.1)
Basophils Relative: 0.4 % (ref 0.0–3.0)
EOS PCT: 1.1 % (ref 0.0–5.0)
Eosinophils Absolute: 0.1 10*3/uL (ref 0.0–0.7)
HCT: 43.6 % (ref 39.0–52.0)
Hemoglobin: 14.9 g/dL (ref 13.0–17.0)
Lymphocytes Relative: 21.6 % (ref 12.0–46.0)
Lymphs Abs: 1.7 10*3/uL (ref 0.7–4.0)
MCHC: 34.2 g/dL (ref 30.0–36.0)
MCV: 85.6 fl (ref 78.0–100.0)
MONO ABS: 0.8 10*3/uL (ref 0.1–1.0)
Monocytes Relative: 10.2 % (ref 3.0–12.0)
Neutro Abs: 5.2 10*3/uL (ref 1.4–7.7)
Neutrophils Relative %: 66.7 % (ref 43.0–77.0)
Platelets: 324 10*3/uL (ref 150.0–400.0)
RBC: 5.09 Mil/uL (ref 4.22–5.81)
RDW: 13 % (ref 11.5–15.5)
WBC: 7.8 10*3/uL (ref 4.0–10.5)

## 2015-02-09 LAB — LIPID PANEL
CHOLESTEROL: 132 mg/dL (ref 0–200)
HDL: 33.6 mg/dL — ABNORMAL LOW (ref >39.00)
LDL CALC: 78 mg/dL (ref 0–99)
NONHDL: 98.4
Total CHOL/HDL Ratio: 4
Triglycerides: 104 mg/dL (ref 0.0–149.0)
VLDL: 20.8 mg/dL (ref 0.0–40.0)

## 2015-02-09 LAB — COMPREHENSIVE METABOLIC PANEL
ALT: 20 U/L (ref 0–53)
AST: 15 U/L (ref 0–37)
Albumin: 4 g/dL (ref 3.5–5.2)
Alkaline Phosphatase: 68 U/L (ref 39–117)
BUN: 9 mg/dL (ref 6–23)
CALCIUM: 9.5 mg/dL (ref 8.4–10.5)
CO2: 30 meq/L (ref 19–32)
CREATININE: 0.92 mg/dL (ref 0.40–1.50)
Chloride: 105 mEq/L (ref 96–112)
GFR: 92.19 mL/min (ref >60.00)
Glucose, Bld: 113 mg/dL — ABNORMAL HIGH (ref 70–99)
POTASSIUM: 4.3 meq/L (ref 3.5–5.1)
SODIUM: 142 meq/L (ref 135–145)
Total Bilirubin: 0.6 mg/dL (ref 0.2–1.2)
Total Protein: 6.3 g/dL (ref 6.0–8.3)

## 2015-02-09 LAB — TSH: TSH: 1.32 u[IU]/mL (ref 0.35–4.50)

## 2015-02-09 LAB — MICROALBUMIN / CREATININE URINE RATIO
Creatinine,U: 29.9 mg/dL
Microalb Creat Ratio: 2.3 mg/g (ref 0.0–30.0)
Microalb, Ur: <0.7 mg/dL (ref 0.0–1.9)

## 2015-02-09 LAB — PSA: PSA: 0.45 ng/mL (ref 0.10–4.00)

## 2015-02-09 LAB — HEMOGLOBIN A1C: HEMOGLOBIN A1C: 7.6 % — AB (ref 4.6–6.5)

## 2015-02-12 ENCOUNTER — Encounter: Payer: Self-pay | Admitting: Internal Medicine

## 2015-02-13 ENCOUNTER — Encounter: Payer: Self-pay | Admitting: Emergency Medicine

## 2015-02-13 ENCOUNTER — Emergency Department: Payer: BC Managed Care – PPO

## 2015-02-13 ENCOUNTER — Telehealth: Payer: Self-pay | Admitting: *Deleted

## 2015-02-13 ENCOUNTER — Emergency Department
Admission: EM | Admit: 2015-02-13 | Discharge: 2015-02-13 | Disposition: A | Discharge status: Home / Self Care | Payer: BC Managed Care – PPO | Attending: Emergency Medicine | Admitting: Emergency Medicine

## 2015-02-13 DIAGNOSIS — L03115 Cellulitis of right lower limb: Secondary | ICD-10-CM

## 2015-02-13 DIAGNOSIS — W228XXA Striking against or struck by other objects, initial encounter: Secondary | ICD-10-CM | POA: Insufficient documentation

## 2015-02-13 DIAGNOSIS — Z794 Long term (current) use of insulin: Secondary | ICD-10-CM | POA: Insufficient documentation

## 2015-02-13 DIAGNOSIS — E119 Type 2 diabetes mellitus without complications: Secondary | ICD-10-CM | POA: Diagnosis not present

## 2015-02-13 DIAGNOSIS — Z79899 Other long term (current) drug therapy: Secondary | ICD-10-CM | POA: Diagnosis not present

## 2015-02-13 DIAGNOSIS — I10 Essential (primary) hypertension: Secondary | ICD-10-CM | POA: Insufficient documentation

## 2015-02-13 DIAGNOSIS — Y9289 Other specified places as the place of occurrence of the external cause: Secondary | ICD-10-CM | POA: Insufficient documentation

## 2015-02-13 DIAGNOSIS — Y998 Other external cause status: Secondary | ICD-10-CM | POA: Diagnosis not present

## 2015-02-13 DIAGNOSIS — S8001XA Contusion of right knee, initial encounter: Secondary | ICD-10-CM | POA: Diagnosis not present

## 2015-02-13 DIAGNOSIS — Z7982 Long term (current) use of aspirin: Secondary | ICD-10-CM | POA: Diagnosis not present

## 2015-02-13 DIAGNOSIS — Y9389 Activity, other specified: Secondary | ICD-10-CM | POA: Insufficient documentation

## 2015-02-13 DIAGNOSIS — M79661 Pain in right lower leg: Secondary | ICD-10-CM | POA: Diagnosis present

## 2015-02-13 MED ORDER — CEPHALEXIN 500 MG PO CAPS: 500.0000 mg | ORAL_CAPSULE | Freq: Four times a day (QID) | ORAL | Status: AC

## 2015-02-13 NOTE — ED Notes (Signed)
Has redness and swelling to right lower leg

## 2015-02-13 NOTE — ED Notes (Signed)
Pt injured right leg over the weekend, swelling and redness to place of injury as well as below injury. Bruising also noted to inner right foot. Pain 1/10

## 2015-02-13 NOTE — Telephone Encounter (Signed)
Pt called states he injured his leg last weekend, states his leg is very painful, red and has a rash.  When I called pt back he was already in the ER being evaluated.

## 2015-02-13 NOTE — ED Notes (Signed)
Pt alert and oriented X4, active, cooperative, pt in NAD. RR even and unlabored, color WNL.  Pt informed to return if any life threatening symptoms occur.   

## 2015-02-13 NOTE — ED Provider Notes (Signed)
[facility] Emergency Department Provider Note  ____________________________________________  Time seen: 3 PM  I have reviewed the triage vital signs and the nursing notes.   HISTORY  Chief Complaint Leg Pain    HPI Joshua Arellano is a 49 y.o. male who presents with right lower extremity discomfort and swelling. Patient reports that he must of bumped his right shin early in the week because he had a "goose egg" the proximal portion of his tibia. This morning he feels that his calf is slightly swollen and he has an area of erythema on the anterior mid tibia. No fevers no chills. No skin break that he knows of. Recent trip [facility]     Past Medical History  Diagnosis Date  . Diabetes mellitus   . Hyperlipidemia   . Hypertension   . Obesity (BMI 30-39.9)   . Hemorrhoid   . Knee pain, left     episodic    Patient Active Problem List   Diagnosis Date Noted  . OSA on CPAP 12/25/2014  . Numbness of finger 11/05/2013  . Achilles tendinosis 08/12/2013  . 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 Depression (emotion) 02/16/2013  . Other malaise and fatigue 11/17/2012  . Allergic reaction to Augmentin 10/08/2012  . Routine general medical examination at a health care facility 04/30/2012  . Obesity (BMI 30-39.9) 07/11/2011  . Rectal bleeding 07/10/2011  . Hemorrhoid   . Diabetes mellitus without complication 04/09/2011  . Dyslipidemia, goal LDL below 70 04/09/2011  . Hypertension 04/09/2011    No past surgical history on file.  Current Outpatient Rx  Name  Route  Sig  Dispense  Refill  . amLODipine (NORVASC) 10 MG tablet   Oral   Take 1 tablet (10 mg total) by mouth daily.   90 tablet   1   . aspirin 81 MG chewable tablet      CHEW AND SWALLOW ONE TABLET BY MOUTH EVERY DAY   90 tablet   0   . atorvastatin (LIPITOR) 20 MG tablet      TAKE 1 TABLET BY MOUTH EVERY DAY   90 tablet   2   . BAYER CONTOUR TEST test strip       USE AS DIRECTED   150 each   1     **Patient requests 90 days supply**   . Canagliflozin 300 MG TABS   Oral   Take 1 tablet by mouth daily.         . Insulin Pen Needle 31G X 8 MM MISC   Does not apply   by Does not apply route.         . metFORMIN (GLUCOPHAGE-XR) 500 MG 24 hr tablet   Oral   Take 2,000 mg by mouth daily.      4   . mometasone (ELOCON) 0.1 % ointment   Topical   Apply topically daily. Patient not taking: Reported on 12/23/2014   45 g   0   . Multiple Vitamins-Minerals (MULTIVITAMIN WITH MINERALS) tablet   Oral   Take 1 tablet by mouth daily.         Marland Kitchen VICTOZA 18 MG/3ML SOPN   Subcutaneous   Inject 1.8 mg into the skin daily.      1     Dispense as written.     Allergies Review of patient's allergies indicates no known allergies.  Family History  Problem Relation Age of Onset  . Cancer Maternal Uncle   .  Stroke Father   . Heart disease Maternal Grandfather   . Hypertension Maternal Grandfather   . Hypertension Paternal Grandmother   . Hypertension Paternal Grandfather     Social History History  Substance Use Topics  . Smoking status: Never Smoker   . Smokeless tobacco: Never Used  . Alcohol Use: No     Comment: rare    Review of Systems  Constitutional: Negative for fever. Eyes: Negative for visual changes. ENT: Negative for sore throat Cardiovascular: Negative for chest pain. Respiratory: Negative for shortness of breath. Gastrointestinal: Negative for abdominal pain, vomiting and diarrhea. Genitourinary: Negative for dysuria. Musculoskeletal: Negative for back pain. Skin: Area of redness on shin. Bruising to leg Neurological: Negative for headaches or focal weakness Psychiatric: No anxiety  10-point ROS otherwise negative.  ____________________________________________   PHYSICAL EXAM:  VITAL SIGNS: ED Triage Vitals  Enc Vitals Group     BP 02/13/15 1423 167/93 mmHg     Pulse Rate 02/13/15 1423 87      Resp 02/13/15 1423 18     Temp 02/13/15 1423 97.7 F (36.5 C)     Temp Source 02/13/15 1423 Oral     SpO2 02/13/15 1423 98 %     Weight 02/13/15 1423 205 lb (92.987 kg)     Height 02/13/15 1423 5\' 10"  (1.778 m)     Head Cir --      Peak Flow --      Pain Score 02/13/15 1425 1     Pain Loc --      Pain Edu? --      Excl. in GC? --      Constitutional: Alert and oriented. Well appearing and in no distress. Eyes: Conjunctivae are normal.  ENT   Head: Normocephalic and atraumatic.   Mouth/Throat: Mucous membranes are moist. Cardiovascular: 2+ distal pulses in the right leg Respiratory: Normal respiratory effort without tachypnea nor retractions.  Gastrointestinal: Soft and non-tender in all quadrants. No distention. There is no CVA tenderness. Genitourinary: deferred Musculoskeletal: Area of erythema to the anterior medial to distal shin suspicious for cellulitis. Possibly small amount of swelling of leg. Obvious contusion proximal portion of right anterior tibia just below the knee. Leg is warm and well perfused Neurologic:  Normal speech and language. No gross focal neurologic deficits are appreciated. Skin:  Skin is warm, dry and intact. No rash noted. Psychiatric: Mood and affect are normal. Patient exhibits appropriate insight and judgment.  ____________________________________________    LABS (pertinent positives/negatives)  Labs Reviewed - No data to display  ____________________________________________   EKG  None  ____________________________________________    RADIOLOGY I have personally reviewed any xrays that were ordered on this patient: Ultrasound shows no DVT  ____________________________________________   PROCEDURES  Procedure(s) performed: none  Critical Care performed: none  ____________________________________________   INITIAL IMPRESSION / ASSESSMENT AND PLAN / ED COURSE  Pertinent labs & imaging results that were available during  my care of the patient were reviewed by me and considered in my medical decision making (see chart for details).  Patient well-appearing. History of diabetes, exam suspicious for cellulitis although we will send ultrasound to rule out DVT  ----------------------------------------- 3:59 PM on 02/13/2015 -----------------------------------------  Ultrasound negative, exam most consistent with cellulitis treat with Keflex patient to follow-up with PCP ____________________________________________   FINAL CLINICAL IMPRESSION(S) / ED DIAGNOSES  Final diagnoses:  Cellulitis of right lower extremity     Jene Everyobert Keydi Giel, MD 02/13/15 1559

## 2015-02-13 NOTE — Discharge Instructions (Signed)

## 2015-02-14 ENCOUNTER — Encounter: Payer: Self-pay | Admitting: Internal Medicine

## 2015-02-14 ENCOUNTER — Ambulatory Visit (INDEPENDENT_AMBULATORY_CARE_PROVIDER_SITE_OTHER): Payer: BC Managed Care – PPO | Admitting: Internal Medicine

## 2015-02-14 VITALS — BP 132/70 | HR 88 | Temp 97.9°F | Resp 14 | Ht 71.0 in | Wt 214.1 lb

## 2015-02-14 DIAGNOSIS — I10 Essential (primary) hypertension: Secondary | ICD-10-CM | POA: Diagnosis not present

## 2015-02-14 DIAGNOSIS — L03115 Cellulitis of right lower limb: Secondary | ICD-10-CM | POA: Diagnosis not present

## 2015-02-14 DIAGNOSIS — Z0000 Encounter for general adult medical examination without abnormal findings: Secondary | ICD-10-CM | POA: Diagnosis not present

## 2015-02-14 DIAGNOSIS — E119 Type 2 diabetes mellitus without complications: Secondary | ICD-10-CM | POA: Diagnosis not present

## 2015-02-14 MED ORDER — LISINOPRIL 5 MG PO TABS: 5.0000 mg | ORAL_TABLET | Freq: Every day | ORAL | Status: DC

## 2015-02-14 MED ORDER — GLIPIZIDE 5 MG PO TABS: 2.5000 mg | ORAL_TABLET | Freq: Every day | ORAL | Status: DC

## 2015-02-14 NOTE — Patient Instructions (Addendum)
Call tomorrow to update me on the leg redness and swelling  Starting lisinopril 5 mg for blood pressure ,  Continue taking amlodipine as well  Starting 2.5 mg glipizide with breakfast only.  You will be referred for colonoscopy for colon cancer screening   Health Maintenance A healthy lifestyle and preventative care can promote health and wellness.  Maintain regular health, dental, and eye exams.  Eat a healthy diet. Foods like vegetables, fruits, whole grains, low-fat dairy products, and lean protein foods contain the nutrients you need and are low in calories. Decrease your intake of foods high in solid fats, added sugars, and salt. Get information about a proper diet from your health care provider, if necessary.  Regular physical exercise is one of the most important things you can do for your health. Most adults should get at least 150 minutes of moderate-intensity exercise (any activity that increases your heart rate and causes you to sweat) each week. In addition, most adults need muscle-strengthening exercises on 2 or more days a week.   Maintain a healthy weight. The body mass index (BMI) is a screening tool to identify possible weight problems. It provides an estimate of body fat based on height and weight. Your health care provider can find your BMI and can help you achieve or maintain a healthy weight. For males 20 years and older:  A BMI below 18.5 is considered underweight.  A BMI of 18.5 to 24.9 is normal.  A BMI of 25 to 29.9 is considered overweight.  A BMI of 30 and above is considered obese.  Maintain normal blood lipids and cholesterol by exercising and minimizing your intake of saturated fat. Eat a balanced diet with plenty of fruits and vegetables. Blood tests for lipids and cholesterol should begin at age 10620 and be repeated every 5 years. If your lipid or cholesterol levels are high, you are over age 51, or you are at high risk for heart disease, you may need your  cholesterol levels checked more frequently.Ongoing high lipid and cholesterol levels should be treated with medicines if diet and exercise are not working.  If you smoke, find out from your health care provider how to quit. If you do not use tobacco, do not start.  Lung cancer screening is recommended for adults aged 55-80 years who are at high risk for developing lung cancer because of a history of smoking. A yearly low-dose CT scan of the lungs is recommended for people who have at least a 30-pack-year history of smoking and are current smokers or have quit within the past 15 years. A pack year of smoking is smoking an average of 1 pack of cigarettes a day for 1 year (for example, a 30-pack-year history of smoking could mean smoking 1 pack a day for 30 years or 2 packs a day for 15 years). Yearly screening should continue until the smoker has stopped smoking for at least 15 years. Yearly screening should be stopped for people who develop a health problem that would prevent them from having lung cancer treatment.  If you choose to drink alcohol, do not have more than 2 drinks per day. One drink is considered to be 12 oz (360 mL) of beer, 5 oz (150 mL) of wine, or 1.5 oz (45 mL) of liquor.  Avoid the use of street drugs. Do not share needles with anyone. Ask for help if you need support or instructions about stopping the use of drugs.  High blood pressure causes heart  disease and increases the risk of stroke. Blood pressure should be checked at least every 1-2 years. Ongoing high blood pressure should be treated with medicines if weight loss and exercise are not effective.  If you are 37-36 years old, ask your health care provider if you should take aspirin to prevent heart disease.  Diabetes screening involves taking a blood sample to check your fasting blood sugar level. This should be done once every 3 years after age 64 if you are at a normal weight and without risk factors for diabetes. Testing  should be considered at a younger age or be carried out more frequently if you are overweight and have at least 1 risk factor for diabetes.  Colorectal cancer can be detected and often prevented. Most routine colorectal cancer screening begins at the age of 78 and continues through age 90. However, your health care provider may recommend screening at an earlier age if you have risk factors for colon cancer. On a yearly basis, your health care provider may provide home test kits to check for hidden blood in the stool. A small camera at the end of a tube may be used to directly examine the colon (sigmoidoscopy or colonoscopy) to detect the earliest forms of colorectal cancer. Talk to your health care provider about this at age 93 when routine screening begins. A direct exam of the colon should be repeated every 5-10 years through age 70, unless early forms of precancerous polyps or small growths are found.  People who are at an increased risk for hepatitis B should be screened for this virus. You are considered at high risk for hepatitis B if:  You were born in a country where hepatitis B occurs often. Talk with your health care provider about which countries are considered high risk.  Your parents were born in a high-risk country and you have not received a shot to protect against hepatitis B (hepatitis B vaccine).  You have HIV or AIDS.  You use needles to inject street drugs.  You live with, or have sex with, someone who has hepatitis B.  You are a man who has sex with other men (MSM).  You get hemodialysis treatment.  You take certain medicines for conditions like cancer, organ transplantation, and autoimmune conditions.  Hepatitis C blood testing is recommended for all people born from [birth year] through 1967 and any individual with known risk factors for hepatitis C.  Healthy men should no longer receive prostate-specific antigen (PSA) blood tests as part of routine cancer screening. Talk to  your health care provider about prostate cancer screening.  Testicular cancer screening is not recommended for adolescents or adult males who have no symptoms. Screening includes self-exam, a health care provider exam, and other screening tests. Consult with your health care provider about any symptoms you have or any concerns you have about testicular cancer.  Practice safe sex. Use condoms and avoid high-risk sexual practices to reduce the spread of sexually transmitted infections (STIs).  You should be screened for STIs, including gonorrhea and chlamydia if:  You are sexually active and are younger than 24 years.  You are older than 24 years, and your health care provider tells you that you are at risk for this type of infection.  Your sexual activity has changed since you were last screened, and you are at an increased risk for chlamydia or gonorrhea. Ask your health care provider if you are at risk.  If you are at risk of being infected  with HIV, it is recommended that you take a prescription medicine daily to prevent HIV infection. This is called pre-exposure prophylaxis (PrEP). You are considered at risk if:  You are a man who has sex with other men (MSM).  You are a heterosexual man who is sexually active with multiple partners.  You take drugs by injection.  You are sexually active with a partner who has HIV.  Talk with your health care provider about whether you are at high risk of being infected with HIV. If you choose to begin PrEP, you should first be tested for HIV. You should then be tested every 3 months for as long as you are taking PrEP.  Use sunscreen. Apply sunscreen liberally and repeatedly throughout the day. You should seek shade when your shadow is shorter than you. Protect yourself by wearing long sleeves, pants, a wide-brimmed hat, and sunglasses year round whenever you are outdoors.  Tell your health care provider of new moles or changes in moles, especially if  there is a change in shape or color. Also, tell your health care provider if a mole is larger than the size of a pencil eraser.  A one-time screening for abdominal aortic aneurysm (AAA) and surgical repair of large AAAs by ultrasound is recommended for men aged 65-75 years who are current or former smokers.  Stay current with your vaccines (immunizations). Document Released: 01/18/2008 Document Revised: 07/27/2013 Document Reviewed: 12/17/2010 [facility] Patient Information 2015 [place], [place]. This information is not intended to replace advice given to you by your health care provider. Make sure you discuss any questions you have with your health care provider.

## 2015-02-14 NOTE — Progress Notes (Signed)
Pre visit review using our clinic review tool, if applicable. No additional management support is needed unless otherwise documented below in the visit note. 

## 2015-02-14 NOTE — Progress Notes (Signed)
Patient ID: Joshua Arellano, male    DOB: 1966-03-26  Age: 49 y.o. MRN: [id number]  The patient is here for annual  wellness examination and management of other chronic and acute problems.   The risk factors are reflected in the social history.  The roster of all physicians providing medical care to patient - is listed in the Snapshot section of the chart.  Home safety : The patient has smoke detectors in the home. They wear seatbelts.  There are no firearms at home. There is no violence in the home.   There is no risks for hepatitis, STDs or HIV. There is no   history of blood transfusion. They have no travel history to infectious disease endemic areas of the world.  The patient has seen their dentist in the last six month. They have seen their eye doctor in the last year. They admit to slight hearing difficulty with regard to whispered voices and some television programs.  They have deferred audiologic testing in the last year.  They do not  have excessive sun exposure. Discussed the need for sun protection: hats, long sleeves and use of sunscreen if there is significant sun exposure.   Diet: the importance of a healthy diet is discussed. They do not have a carbohydrate restricted diet. .  The benefits of regular aerobic exercise were discussed. He does not exercise regularly .   Depression screen: there are no signs or vegative symptoms of depression- irritability, change in appetite, anhedonia, sadness/tearfullness.  Cognitive assessment: the patient manages all their financial and personal affairs and is actively engaged. They could relate day,date,year and events; recalled 2/3 objects at 3 minutes; performed clock-face test normally.  The following portions of the patient's history were reviewed and updated as appropriate: allergies, current medications, past family history, past medical history,  past surgical history, past social history  and problem list.  Visual acuity was not  assessed per patient preference since she has regular follow up with her ophthalmologist. Hearing and body mass index were assessed and reviewed.   During the course of the visit the patient was educated and counseled about appropriate screening and preventive services including : fall prevention , diabetes screening, nutrition counseling, colorectal cancer screening, and recommended immunizations.    CC: The primary encounter diagnosis was Cellulitis of leg, right. Diagnoses of Routine general medical examination at a health care facility, Diabetes mellitus without complication, and Essential hypertension were also pertinent to this visit.  History Makenzie has a past medical history of Diabetes mellitus; Hyperlipidemia; Hypertension; Obesity (BMI 30-39.9); Hemorrhoid; and Knee pain, left.    Right calf developed focal swelling on anterior surface just below the knee on [place] 4 [place] [employer], no obvious bite or stinging insect.  No pain ,  But the following week started having pain in medial calf with walking  Accompanied by diffuse swelling of the entire calf.  Yesterday noticed a red warm area aboveTHE ANKLE.  No fevers  Went to ED and had ultrasound yesterday .  No DVT,  Was given Keflex 500 mg qid and has had 4 doses thus far. There has been no worsening but no improvement either.  No labs were done   He has no past surgical history on file.   His family history includes Cancer in his maternal uncle; Heart disease in his maternal grandfather; Hypertension in his maternal grandfather, paternal grandfather, and paternal grandmother; Stroke in his father.He reports that  he has never smoked. He has never used smokeless tobacco. He reports that he does not drink alcohol or use illicit drugs.  Outpatient Prescriptions Prior to Visit  Medication Sig Dispense Refill  . amLODipine (NORVASC) 10 MG tablet Take 1 tablet (10 mg total) by mouth daily. 90 tablet 1  . aspirin 81 MG  chewable tablet CHEW AND SWALLOW ONE TABLET BY MOUTH EVERY DAY 90 tablet 0  . atorvastatin (LIPITOR) 20 MG tablet TAKE 1 TABLET BY MOUTH EVERY DAY 90 tablet 2  . BAYER CONTOUR TEST test strip USE AS DIRECTED 150 each 1  . Canagliflozin 300 MG TABS Take 1 tablet by mouth daily.    . cephALEXin (KEFLEX) 500 MG capsule Take 1 capsule (500 mg total) by mouth 4 (four) times daily. 28 capsule 0  . Insulin Pen Needle 31G X 8 MM MISC by Does not apply route.    . metFORMIN (GLUCOPHAGE-XR) 500 MG 24 hr tablet Take 2,000 mg by mouth daily.  4  . Multiple Vitamins-Minerals (MULTIVITAMIN WITH MINERALS) tablet Take 1 tablet by mouth daily.    Marland Kitchen. VICTOZA 18 MG/3ML SOPN Inject 1.8 mg into the skin daily.  1  . mometasone (ELOCON) 0.1 % ointment Apply topically daily. (Patient not taking: Reported on 12/23/2014) 45 g 0   No facility-administered medications prior to visit.    Review of Systems   Patient denies headache, fevers, malaise, unintentional weight loss, skin rash, eye pain, sinus congestion and sinus pain, sore throat, dysphagia,  hemoptysis , cough, dyspnea, wheezing, chest pain, palpitations, orthopnea, edema, abdominal pain, nausea, melena, diarrhea, constipation, flank pain, dysuria, hematuria, urinary  Frequency, nocturia, numbness, tingling, seizures,  Focal weakness, Loss of consciousness,  Tremor, insomnia, depression, anxiety, and suicidal ideation.      Objective:  BP 132/70 mmHg  Pulse 88  Temp(Src) 97.9 F (36.6 C) (Oral)  Resp 14  Ht 5\' 11"  (1.803 m)  Wt 214 lb 1.9 oz (97.124 kg)  BMI 29.88 kg/m2  SpO2 97%  Physical Exam  .General appearance: alert, cooperative and appears stated age Ears: normal TM's and external ear canals both ears Throat: lips, mucosa, and tongue normal; teeth and gums normal Neck: no adenopathy, no carotid bruit, supple, symmetrical, trachea midline and thyroid not enlarged, symmetric, no tenderness/mass/nodules Back: symmetric, no curvature. ROM  normal. No CVA tenderness. Lungs: clear to auscultation bilaterally Heart: regular rate and rhythm, S1, S2 normal, no murmur, click, rub or gallop Abdomen: soft, non-tender; bowel sounds normal; no masses,  no organomegaly Pulses: 2+ and symmetric Skin: right calf tense with an erythematous area demarcated with ink on lower shin.  No rashes or lesions Lymph nodes: Cervical, supraclavicular, and axillary nodes normal.   Assessment & Plan:   Problem List Items Addressed This Visit      Unprioritized   Diabetes mellitus without complication    Improving with use of invokanna, metformin and Victoza, and not at goal.  he has no proteinuria by current urinalysis.  Adding glipizide today   Reminder for annual eye exam given.  Lab Results  Component Value Date   HGBA1C 7.6* 02/09/2015   Lab Results  Component Value Date   MICROALBUR <0.7 02/09/2015   Lab Results  Component Value Date   CHOL 132 02/09/2015   HDL 33.60* 02/09/2015   LDLCALC 78 02/09/2015   LDLDIRECT 82 12/23/2014   TRIG 104.0 02/09/2015   CHOLHDL 4 02/09/2015      Foot exam is normal today  Relevant Medications   glipiZIDE (GLUCOTROL) 5 MG tablet   lisinopril (PRINIVIL,ZESTRIL) 5 MG tablet   Hypertension    BP 132/70 mmHg  Pulse 88  Temp(Src) 97.9 F (36.6 C) (Oral)  Resp 14  Ht 5\' 11"  (1.803 m)  Wt 214 lb 1.9 oz (97.124 kg)  BMI 29.88 kg/m2  SpO2 97% Lab Results  Component Value Date   MICROALBUR <0.7 02/09/2015    Adding low dose lisinopril today to amlodipine       Relevant Medications   lisinopril (PRINIVIL,ZESTRIL) 5 MG tablet   Routine general medical examination at a health care facility    Annual wellness  exam was done as well as a comprehensive physical exam and management of acute and chronic conditions .  During the course of the visit the patient was educated and counseled about appropriate screening and preventive services including :  diabetes screening, lipid analysis  with projected  10 year  risk for CAD , nutrition counseling, colorectal cancer screening, and recommended immunizations.  Printed recommendations for health maintenance screenings was given.       Cellulitis of leg, right - Primary    He has continued redness and swelling and pain with walking despite treatment for 4 daus with Keflex started in ED after DVT was ruled out.  He needs CT to rule out abscess  Given his history of poorly controlled DM.         I have discontinued Mr. Gubbels's mometasone. I am also having him start on glipiZIDE and lisinopril. Additionally, I am having him maintain his multivitamin with minerals, Insulin Pen Needle, canagliflozin, BAYER CONTOUR TEST, VICTOZA, metFORMIN, atorvastatin, amLODipine, aspirin, and cephALEXin.  Meds ordered this encounter  Medications  . glipiZIDE (GLUCOTROL) 5 MG tablet    Sig: Take 0.5 tablets (2.5 mg total) by mouth daily before breakfast.    Dispense:  60 tablet    Refill:  3  . lisinopril (PRINIVIL,ZESTRIL) 5 MG tablet    Sig: Take 1 tablet (5 mg total) by mouth daily.    Dispense:  90 tablet    Refill:  3    Medications Discontinued During This Encounter  Medication Reason  . mometasone (ELOCON) 0.1 % ointment Completed Course    Follow-up: Return in about 1 week (around 02/21/2015).   Sherlene Shams, MD

## 2015-02-16 ENCOUNTER — Telehealth: Payer: Self-pay | Admitting: *Deleted

## 2015-02-16 DIAGNOSIS — R2241 Localized swelling, mass and lump, right lower limb: Secondary | ICD-10-CM

## 2015-02-16 NOTE — Telephone Encounter (Signed)
Spoke with pt, advised CT ordered and he will be called to schedule.  Pt verbalized understanding.

## 2015-02-16 NOTE — Telephone Encounter (Signed)
I have ordered the CT scan  Melissa will call hin with the appt ASAP

## 2015-02-16 NOTE — Telephone Encounter (Signed)
Pt called states his leg is not any better.  Pt states the redness is about the same, the swelling increases as the day progresses.  Pt states he wants to continue with the CT.  Please advise

## 2015-02-17 ENCOUNTER — Other Ambulatory Visit: Payer: Self-pay | Admitting: Internal Medicine

## 2015-02-17 DIAGNOSIS — L03115 Cellulitis of right lower limb: Secondary | ICD-10-CM | POA: Insufficient documentation

## 2015-02-17 NOTE — Assessment & Plan Note (Signed)

## 2015-02-17 NOTE — Assessment & Plan Note (Addendum)
He has continued redness and swelling and pain with walking despite treatment for 4 daus with Keflex started in ED after DVT was ruled out.  He needs CT to rule out abscess  Given his history of poorly controlled DM.

## 2015-02-17 NOTE — Assessment & Plan Note (Addendum)
Improving with use of invokanna, metformin and Victoza, and not at goal.  he has no proteinuria by current urinalysis.  Adding glipizide today   Reminder for annual eye exam given.  Lab Results  Component Value Date   HGBA1C 7.6* 02/09/2015   Lab Results  Component Value Date   MICROALBUR <0.7 02/09/2015   Lab Results  Component Value Date   CHOL 132 02/09/2015   HDL 33.60* 02/09/2015   LDLCALC 78 02/09/2015   LDLDIRECT 82 12/23/2014   TRIG 104.0 02/09/2015   CHOLHDL 4 02/09/2015      Foot exam is normal today

## 2015-02-17 NOTE — Assessment & Plan Note (Signed)
BP 132/70 mmHg  Pulse 88  Temp(Src) 97.9 F (36.6 C) (Oral)  Resp 14  Ht 5\' 11"  (1.803 m)  Wt 214 lb 1.9 oz (97.124 kg)  BMI 29.88 kg/m2  SpO2 97% Lab Results  Component Value Date   MICROALBUR <0.7 02/09/2015    Adding low dose lisinopril today to amlodipine

## 2015-02-18 ENCOUNTER — Other Ambulatory Visit: Payer: Self-pay | Admitting: Internal Medicine

## 2015-02-18 ENCOUNTER — Telehealth: Payer: Self-pay | Admitting: Family Medicine

## 2015-02-18 ENCOUNTER — Ambulatory Visit
Admission: RE | Admit: 2015-02-18 | Discharge: 2015-02-18 | Disposition: A | Discharge status: Home / Self Care | Payer: BC Managed Care – PPO | Source: Ambulatory Visit | Attending: Internal Medicine | Admitting: Internal Medicine

## 2015-02-18 ENCOUNTER — Other Ambulatory Visit (HOSPITAL_COMMUNITY): Payer: BC Managed Care – PPO

## 2015-02-18 DIAGNOSIS — R2241 Localized swelling, mass and lump, right lower limb: Secondary | ICD-10-CM | POA: Insufficient documentation

## 2015-02-18 DIAGNOSIS — L03115 Cellulitis of right lower limb: Secondary | ICD-10-CM

## 2015-02-18 MED ORDER — IOHEXOL 350 MG/ML SOLN
150.0000 mL | Freq: Once | INTRAVENOUS | Status: AC | PRN
  Administered 2015-02-18: 150 mL via INTRAVENOUS

## 2015-02-18 NOTE — Telephone Encounter (Signed)
Received report on CT scan.  Reviewed by me and ?phlegmon vs early abscess noted near tibial tubercle.  No bone involvement at moment.   Broaden antibiotics to doxy and warned of side effects.  Discussed with pt if f/c/n/v/d occur or worsening pain and swelling will need to go to ED immediately.  Patient states it is tolerable at moment and would like to try new antibiotic.  Discussed he may need aspiration in near future and possible surgery.  Has follow up with PCP worse case on Tuesday.  Patient understands risk of waiting and will decide tomorrow about going to ED.  Very appreciative.

## 2015-02-19 ENCOUNTER — Encounter: Payer: Self-pay | Admitting: Emergency Medicine

## 2015-02-19 ENCOUNTER — Emergency Department
Admission: EM | Admit: 2015-02-19 | Discharge: 2015-02-19 | Disposition: A | Discharge status: Home / Self Care | Payer: BC Managed Care – PPO | Attending: Emergency Medicine | Admitting: Emergency Medicine

## 2015-02-19 DIAGNOSIS — Z792 Long term (current) use of antibiotics: Secondary | ICD-10-CM | POA: Diagnosis not present

## 2015-02-19 DIAGNOSIS — S8011XD Contusion of right lower leg, subsequent encounter: Secondary | ICD-10-CM | POA: Diagnosis not present

## 2015-02-19 DIAGNOSIS — Z7982 Long term (current) use of aspirin: Secondary | ICD-10-CM | POA: Insufficient documentation

## 2015-02-19 DIAGNOSIS — Z794 Long term (current) use of insulin: Secondary | ICD-10-CM | POA: Insufficient documentation

## 2015-02-19 DIAGNOSIS — I10 Essential (primary) hypertension: Secondary | ICD-10-CM | POA: Insufficient documentation

## 2015-02-19 DIAGNOSIS — X58XXXD Exposure to other specified factors, subsequent encounter: Secondary | ICD-10-CM | POA: Insufficient documentation

## 2015-02-19 DIAGNOSIS — L03115 Cellulitis of right lower limb: Secondary | ICD-10-CM | POA: Insufficient documentation

## 2015-02-19 DIAGNOSIS — M79604 Pain in right leg: Secondary | ICD-10-CM | POA: Diagnosis present

## 2015-02-19 DIAGNOSIS — Z79899 Other long term (current) drug therapy: Secondary | ICD-10-CM | POA: Insufficient documentation

## 2015-02-19 DIAGNOSIS — E119 Type 2 diabetes mellitus without complications: Secondary | ICD-10-CM | POA: Insufficient documentation

## 2015-02-19 MED ORDER — LIDOCAINE VISCOUS 2 % MT SOLN
15.0000 mL | Freq: Once | OROMUCOSAL | Status: DC
Start: 2015-02-19 — End: 2015-02-19

## 2015-02-19 MED ORDER — LIDOCAINE HCL (PF) 1 % IJ SOLN
INTRAMUSCULAR | Status: AC
  Filled 2015-02-19: qty 5

## 2015-02-19 MED ORDER — AMOXICILLIN 500 MG PO CAPS: 500.0000 mg | ORAL_CAPSULE | Freq: Once | ORAL | Status: DC

## 2015-02-19 MED ORDER — OXYCODONE-ACETAMINOPHEN 5-325 MG PO TABS: 1.0000 | ORAL_TABLET | Freq: Once | ORAL | Status: DC

## 2015-02-19 NOTE — Discharge Instructions (Signed)
Hematoma A hematoma is a collection of blood under the skin, in an organ, in a body space, in a joint space, or in other tissue. The blood can clot to form a lump that you can see and feel. The lump is often firm and may sometimes become sore and tender. Most hematomas get better in a few days to weeks. However, some hematomas may be serious and require medical care. Hematomas can range in size from very small to very large. CAUSES  A hematoma can be caused by a blunt or penetrating injury. It can also be caused by spontaneous leakage from a blood vessel under the skin. Spontaneous leakage from a blood vessel is more likely to occur in older people, especially those taking blood thinners. Sometimes, a hematoma can develop after certain medical procedures. SIGNS AND SYMPTOMS   A firm lump on the body.  Possible pain and tenderness in the area.  Bruising.Blue, dark blue, purple-red, or yellowish skin may appear at the site of the hematoma if the hematoma is close to the surface of the skin. For hematomas in deeper tissues or body spaces, the signs and symptoms may be subtle. For example, an intra-abdominal hematoma may cause abdominal pain, weakness, fainting, and shortness of breath. An intracranial hematoma may cause a headache or symptoms such as weakness, trouble speaking, or a change in consciousness. DIAGNOSIS  A hematoma can usually be diagnosed based on your medical history and a physical exam. Imaging tests may be needed if your health care provider suspects a hematoma in deeper tissues or body spaces, such as the abdomen, head, or chest. These tests may include ultrasonography or a CT scan.  TREATMENT  Hematomas usually go away on their own over time. Rarely does the blood need to be drained out of the body. Large hematomas or those that may affect vital organs will sometimes need surgical drainage or monitoring. HOME CARE INSTRUCTIONS   Apply ice to the injured area:   Put ice in a  plastic bag.   Place a towel between your skin and the bag.   Leave the ice on for 20 minutes, 2-3 times a day for the first 1 to 2 days.   After the first 2 days, switch to using warm compresses on the hematoma.   Elevate the injured area to help decrease pain and swelling. Wrapping the area with an elastic bandage may also be helpful. Compression helps to reduce swelling and promotes shrinking of the hematoma. Make sure the bandage is not wrapped too tight.   If your hematoma is on a lower extremity and is painful, crutches may be helpful for a couple days.   Only take over-the-counter or prescription medicines as directed by your health care provider. SEEK IMMEDIATE MEDICAL CARE IF:   You have increasing pain, or your pain is not controlled with medicine.   You have a fever.   You have worsening swelling or discoloration.   Your skin over the hematoma breaks or starts bleeding.   Your hematoma is in your chest or abdomen and you have weakness, shortness of breath, or a change in consciousness.  Your hematoma is on your scalp (caused by a fall or injury) and you have a worsening headache or a change in alertness or consciousness. MAKE SURE YOU:   Understand these instructions.  Will watch your condition.  Will get help right away if you are not doing well or get worse. Document Released: 03/05/2004 Document Revised: 03/24/2013 Document Reviewed: 12/30/2012  ExitCare Patient Information 2015 [place]. This information is not intended to replace advice given to you by your health care provider. Make sure you discuss any questions you have with your health care provider.  Cellulitis Cellulitis is an infection of the skin and the tissue beneath it. The infected area is usually red and tender. Cellulitis occurs most often in the arms and lower legs.  CAUSES  Cellulitis is caused by bacteria that enter the skin through cracks or cuts in the skin. The most common  types of bacteria that cause cellulitis are staphylococci and streptococci. SIGNS AND SYMPTOMS   Redness and warmth.  Swelling.  Tenderness or pain.  Fever. DIAGNOSIS  Your health care provider can usually determine what is wrong based on a physical exam. Blood tests may also be done. TREATMENT  Treatment usually involves taking an antibiotic medicine. HOME CARE INSTRUCTIONS   Take your antibiotic medicine as directed by your health care provider. Finish the antibiotic even if you start to feel better.  Keep the infected arm or leg elevated to reduce swelling.  Apply a warm cloth to the affected area up to 4 times per day to relieve pain.  Take medicines only as directed by your health care provider.  Keep all follow-up visits as directed by your health care provider. SEEK MEDICAL CARE IF:   You notice red streaks coming from the infected area.  Your red area gets larger or turns dark in color.  Your bone or joint underneath the infected area becomes painful after the skin has healed.  Your infection returns in the same area or another area.  You notice a swollen bump in the infected area.  You develop new symptoms.  You have a fever. SEEK IMMEDIATE MEDICAL CARE IF:   You feel very sleepy.  You develop vomiting or diarrhea.  You have a general ill feeling (malaise) with muscle aches and pains. MAKE SURE YOU:   Understand these instructions.  Will watch your condition.  Will get help right away if you are not doing well or get worse. Document Released: 05/01/2005 Document Revised: 12/06/2013 Document Reviewed: 10/07/2011 [facility] Patient Information 2015 [place], [place]. This information is not intended to replace advice given to you by your health care provider. Make sure you discuss any questions you have with your health care provider.

## 2015-02-19 NOTE — ED Notes (Signed)
Pt states he had "goose egg" on his right leg that was warm to the touch on July 4 weekend. He came here and was told he had cellulitis and put on antibiotic. He went to his PCP to follow up and was sent for CT scan yesterday. Results showed "large amount of infection" in the leg and he was given Doxycycline to take. He has taken 2 doses of doxy. They told him to come to ED for drainage of abscess and possible admission.

## 2015-02-19 NOTE — ED Notes (Signed)
MD Brown at bedside.

## 2015-02-19 NOTE — ED Notes (Addendum)
MD Manson PasseyBrown at bedside at this time draining abcess at this time, pt tolerating well. No concerns noted. Xylocaine given by MD brown at this time

## 2015-02-19 NOTE — ED Provider Notes (Signed)
[facility] Emergency Department Provider Note  ____________________________________________  Time seen: 12:44 PM  I have reviewed the triage vital signs and the nursing notes.   HISTORY  Chief Complaint Abscess      HPI Joshua Arellano is a 49 y.o. male presents with history of blunt trauma to the anterior portion of the right lower leg on July 4 patient states since that time he's had worsening pain swelling and redness in that area. Patient was seen by Dr. Cyril Loosen on July 11 and prescribed Keflex. Patient states however area of swelling and pain has persisted. Patient was then seen by Dr. Katrinka Blazing who prescribed doxycycline and performed a CT scan of the lower leg which revealed abscess versus liquefied hematoma.     Past Medical History  Diagnosis Date  . Diabetes mellitus   . Hyperlipidemia   . Hypertension   . Obesity (BMI 30-39.9)   . Hemorrhoid   . Knee pain, left     episodic    Patient Active Problem List   Diagnosis Date Noted  . Cellulitis of leg, right 02/17/2015  . OSA on CPAP 12/25/2014  . Numbness of finger 11/05/2013  . Achilles tendinosis 08/12/2013  . 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 Depression (emotion) 02/16/2013  . Other malaise and fatigue 11/17/2012  . Allergic reaction to Augmentin 10/08/2012  . Routine general medical examination at a health care facility 04/30/2012  . Obesity (BMI 30-39.9) 07/11/2011  . Rectal bleeding 07/10/2011  . Hemorrhoid   . Diabetes mellitus without complication 04/09/2011  . Dyslipidemia, goal LDL below 70 04/09/2011  . Hypertension 04/09/2011    Past Surgical History  Procedure Laterality Date  . Tonsillectomy      Current Outpatient Rx  Name  Route  Sig  Dispense  Refill  . amLODipine (NORVASC) 10 MG tablet   Oral   Take 1 tablet (10 mg total) by mouth daily.   90 tablet   1   . aspirin 81 MG chewable tablet      CHEW AND SWALLOW ONE TABLET BY MOUTH EVERY  DAY   90 tablet   0   . atorvastatin (LIPITOR) 20 MG tablet      TAKE 1 TABLET BY MOUTH EVERY DAY   90 tablet   2   . BAYER CONTOUR TEST test strip      USE AS DIRECTED   150 each   1     **Patient requests 90 days supply**   . Canagliflozin 300 MG TABS   Oral   Take 1 tablet by mouth daily.         . cephALEXin (KEFLEX) 500 MG capsule   Oral   Take 1 capsule (500 mg total) by mouth 4 (four) times daily.   28 capsule   0   . glipiZIDE (GLUCOTROL) 5 MG tablet   Oral   Take 0.5 tablets (2.5 mg total) by mouth daily before breakfast.   60 tablet   3   . Insulin Pen Needle 31G X 8 MM MISC   Does not apply   by Does not apply route.         Marland Kitchen lisinopril (PRINIVIL,ZESTRIL) 5 MG tablet   Oral   Take 1 tablet (5 mg total) by mouth daily.   90 tablet   3   . metFORMIN (GLUCOPHAGE-XR) 500 MG 24 hr tablet   Oral   Take 2,000 mg by mouth daily.      4   .  Multiple Vitamins-Minerals (MULTIVITAMIN WITH MINERALS) tablet   Oral   Take 1 tablet by mouth daily.         Marland Kitchen. VICTOZA 18 MG/3ML SOPN   Subcutaneous   Inject 1.8 mg into the skin daily.      1     Dispense as written.     Allergies Review of patient's allergies indicates no known allergies.  Family History  Problem Relation Age of Onset  . Cancer Maternal Uncle   . Stroke Father   . Heart disease Maternal Grandfather   . Hypertension Maternal Grandfather   . Hypertension Paternal Grandmother   . Hypertension Paternal Grandfather     Social History History  Substance Use Topics  . Smoking status: Never Smoker   . Smokeless tobacco: Never Used  . Alcohol Use: Yes     Comment: rare    Review of Systems  Constitutional: Negative for fever. Eyes: Negative for visual changes. ENT: Negative for sore throat. Cardiovascular: Negative for chest pain. Respiratory: Negative for shortness of breath. Gastrointestinal: Negative for abdominal pain, vomiting and diarrhea. Genitourinary:  Negative for dysuria. Musculoskeletal: Negative for back pain. Positive for right lower extremity pain Skin: Negative for rash. Neurological: Negative for headaches, focal weakness or numbness.   10-point ROS otherwise negative.  ____________________________________________   PHYSICAL EXAM:  VITAL SIGNS: ED Triage Vitals  Enc Vitals Group     BP 02/19/15 1124 145/79 mmHg     Pulse Rate 02/19/15 1124 98     Resp 02/19/15 1124 18     Temp 02/19/15 1124 97.7 F (36.5 C)     Temp Source 02/19/15 1124 Oral     SpO2 02/19/15 1124 98 %     Weight 02/19/15 1124 214 lb (97.07 kg)     Height 02/19/15 1124 5\' 11"  (1.803 m)     Head Cir --      Peak Flow --      Pain Score 02/19/15 1125 4     Pain Loc --      Pain Edu? --      Excl. in GC? --      Constitutional: Alert and oriented. Well appearing and in no distress. Eyes: Conjunctivae are normal. PERRL. Normal extraocular movements. ENT   Head: Normocephalic and atraumatic.   Nose: No congestion/rhinnorhea.   Mouth/Throat: Mucous membranes are moist.   Neck: No stridor. Hematological/Lymphatic/Immunilogical: No cervical lymphadenopathy. Cardiovascular: Normal rate, regular rhythm. Normal and symmetric distal pulses are present in all extremities. No murmurs, rubs, or gallops. Respiratory: Normal respiratory effort without tachypnea nor retractions. Breath sounds are clear and equal bilaterally. No wheezes/rales/rhonchi. Gastrointestinal: Soft and nontender. No distention. There is no CVA tenderness. Genitourinary: deferred Musculoskeletal: Nontender with normal range of motion in all extremities. No joint effusions.  No lower extremity tenderness nor edema. Neurologic:  Normal speech and language. No gross focal neurologic deficits are appreciated. Speech is normal.  Skin:  Skin is warm, dry and intact. No rash noted. Psychiatric: Mood and affect are normal. Speech and behavior are normal. Patient exhibits  appropriate insight and judgment.  ____________________________________________     RADIOLOGY  CT tibia and fibula on the right revealed: "Small abscess versus liquefied hematoma"  ____________________________________________   PROCEDURES  Procedure(s) performed: INCISION AND DRAINAGE Performed by: Darci CurrentBROWN, [place] Consent: Verbal consent obtained. Risks and benefits: risks, benefits and alternatives were discussed Type: abscess  Body area: right lower extremity   Anesthesia: local infiltration  Incision was made with a scalpel.  Local anesthetic: lidocaine 1%   Anesthetic total: 5ml  Complexity: complex Blunt dissection to break up loculations  Drainage : Dark blood expressed Drainage amount: 8 milliliters        ____________________________________________   INITIAL IMPRESSION / ASSESSMENT AND PLAN / ED COURSE  Pertinent labs & imaging results that were available during my care of the patient were reviewed by me and considered in my medical decision making (see chart for details).  History and physical exam concerning for infected right lower extremity hematoma. I&D performed as before mentioned. Patient tolerance: Patient tolerated the procedure well with no immediate complications. She was advised to continue with doxycycline and Keflex as prescribed.  ____________________________________________   FINAL CLINICAL IMPRESSION(S) / ED DIAGNOSES  Final diagnoses:  Cellulitis of leg, right  Traumatic hematoma of right lower leg, subsequent encounter      Darci Current, MD 02/21/15 [id number]

## 2015-02-20 ENCOUNTER — Ambulatory Visit: Payer: BC Managed Care – PPO

## 2015-02-21 ENCOUNTER — Encounter: Payer: Self-pay | Admitting: Internal Medicine

## 2015-02-21 ENCOUNTER — Ambulatory Visit (INDEPENDENT_AMBULATORY_CARE_PROVIDER_SITE_OTHER): Payer: BC Managed Care – PPO | Admitting: Internal Medicine

## 2015-02-21 ENCOUNTER — Other Ambulatory Visit: Payer: Self-pay | Admitting: Internal Medicine

## 2015-02-21 VITALS — BP 144/80 | HR 97 | Temp 98.3°F | Resp 14 | Ht 71.0 in | Wt 218.5 lb

## 2015-02-21 DIAGNOSIS — L03115 Cellulitis of right lower limb: Secondary | ICD-10-CM

## 2015-02-21 DIAGNOSIS — T148 Other injury of unspecified body region: Secondary | ICD-10-CM

## 2015-02-21 DIAGNOSIS — M7989 Other specified soft tissue disorders: Secondary | ICD-10-CM | POA: Diagnosis not present

## 2015-02-21 DIAGNOSIS — T148XXA Other injury of unspecified body region, initial encounter: Secondary | ICD-10-CM

## 2015-02-21 MED ORDER — FUROSEMIDE 20 MG PO TABS: 20.0000 mg | ORAL_TABLET | Freq: Every day | ORAL | Status: DC

## 2015-02-21 NOTE — Progress Notes (Signed)
Subjective:  Patient ID: Joshua Arellano, male    DOB: 01-10-1966  Age: 49 y.o. MRN: [id number]  CC: The primary encounter diagnosis was Leg swelling. Diagnoses of Cellulitis of leg, right and Hematoma and contusion were also pertinent to this visit.  HPI Joshua Arellano presents for follow up on cellultis of right leg,  With recent CT suggesting abscess vs hmatoma, confirmed hemaotma by incision done by ER physician 3 days ago,. And antibiotics changed from keflex,  After 6 days,  To doxycycline. By Dr Katrinka Blazing.  The lower extremity is still swollen ,  And the skin is still red. Patient is not having fevers or pain.  Tolerating antibiotics without rash, nausea or diarrhea.     Outpatient Prescriptions Prior to Visit  Medication Sig Dispense Refill  . amLODipine (NORVASC) 10 MG tablet Take 1 tablet (10 mg total) by mouth daily. 90 tablet 1  . aspirin 81 MG chewable tablet CHEW AND SWALLOW ONE TABLET BY MOUTH EVERY DAY 90 tablet 0  . atorvastatin (LIPITOR) 20 MG tablet TAKE 1 TABLET BY MOUTH EVERY DAY 90 tablet 2  . BAYER CONTOUR TEST test strip USE AS DIRECTED 150 each 1  . Canagliflozin 300 MG TABS Take 1 tablet by mouth daily.    . cephALEXin (KEFLEX) 500 MG capsule Take 1 capsule (500 mg total) by mouth 4 (four) times daily. 28 capsule 0  . glipiZIDE (GLUCOTROL) 5 MG tablet Take 0.5 tablets (2.5 mg total) by mouth daily before breakfast. 60 tablet 3  . Insulin Pen Needle 31G X 8 MM MISC by Does not apply route.    Marland Kitchen lisinopril (PRINIVIL,ZESTRIL) 5 MG tablet Take 1 tablet (5 mg total) by mouth daily. 90 tablet 3  . metFORMIN (GLUCOPHAGE-XR) 500 MG 24 hr tablet Take 2,000 mg by mouth daily.  4  . Multiple Vitamins-Minerals (MULTIVITAMIN WITH MINERALS) tablet Take 1 tablet by mouth daily.    Marland Kitchen VICTOZA 18 MG/3ML SOPN Inject 1.8 mg into the skin daily.  1   No facility-administered medications prior to visit.    Review of Systems;  Patient denies headache, fevers, malaise,  unintentional weight loss, skin rash, eye pain, sinus congestion and sinus pain, sore throat, dysphagia,  hemoptysis , cough, dyspnea, wheezing, chest pain, palpitations, orthopnea, edema, abdominal pain, nausea, melena, diarrhea, constipation, flank pain, dysuria, hematuria, urinary  Frequency, nocturia, numbness, tingling, seizures,  Focal weakness, Loss of consciousness,  Tremor, insomnia, depression, anxiety, and suicidal ideation.      Objective:  BP 144/80 mmHg  Pulse 97  Temp(Src) 98.3 F (36.8 C) (Oral)  Resp 14  Ht  (1.803 m)  Wt 218 lb 8 oz (99.111 kg)  BMI 30.49 kg/m2  SpO2 96%  BP Readings from Last 3 Encounters:  02/21/15 144/80  02/19/15 133/84  02/14/15 132/70    Wt Readings from Last 3 Encounters:  02/21/15 218 lb 8 oz (99.111 kg)  02/19/15 214 lb (97.07 kg)  02/14/15 214 lb 1.9 oz (97.124 kg)    General appearance: alert, cooperative and appears stated age Ears: normal TM's and external ear canals both ears Throat: lips, mucosa, and tongue normal; teeth and gums normal Neck: no adenopathy, no carotid bruit, supple, symmetrical, trachea midline and thyroid not enlarged, symmetric, no tenderness/mass/nodules Back: symmetric, no curvature. ROM normal. No CVA tenderness. Lungs: clear to auscultation bilaterally Heart: regular rate and rhythm, S1, S2 normal, no murmur, click, rub or gallop Abdomen: soft, non-tender; bowel sounds normal; no masses,  no  organomegaly Pulses: 2+ and symmetric Skin: Skin color, texture, turgor normal. No rashes or lesions Lymph nodes: Cervical, supraclavicular, and axillary nodes normal.  Lab Results  Component Value Date   HGBA1C 7.6* 02/09/2015   HGBA1C 7.1* 11/07/2014   HGBA1C 8.2* 07/25/2014    Lab Results  Component Value Date   CREATININE 0.92 02/09/2015   CREATININE 0.95 12/23/2014   CREATININE 0.9 07/25/2014    Lab Results  Component Value Date   WBC 7.8 02/09/2015   HGB 14.9 02/09/2015   HCT 43.6  02/09/2015   PLT 324.0 02/09/2015   GLUCOSE 113* 02/09/2015   CHOL 132 02/09/2015   TRIG 104.0 02/09/2015   HDL 33.60* 02/09/2015   LDLDIRECT 82 12/23/2014   LDLCALC 78 02/09/2015   ALT 20 02/09/2015   AST 15 02/09/2015   NA 142 02/09/2015   K 4.3 02/09/2015   CL 105 02/09/2015   CREATININE 0.92 02/09/2015   BUN 9 02/09/2015   CO2 30 02/09/2015   TSH 1.32 02/09/2015   PSA 0.45 02/09/2015   HGBA1C 7.6* 02/09/2015   MICROALBUR <0.7 02/09/2015    No results found.  Assessment & Plan:   Problem List Items Addressed This Visit      Unprioritized   Cellulitis of leg, right    He has completed a course of keflex and is now taking doxycycline given the CT results showing inflammation in the lower leg .  Advised to continue the antibiotic given the persistent redness.  Referral for venous ultrasound to rule out venous insufficiency as cause for persistent redness without warmth .  Adding a few days of furosemide for edema.       Hematoma and contusion    Right leg ,  Incised by ER physician after CT suggested either abscess or hematoma.   Reassurance provided that the rest of the hematoma would be resorbed by body over time. ,  Advised to apply cool compressess or warm compresses,  Or alternate, whichever felt better       Other Visit Diagnoses    Leg swelling    -  Primary    Relevant Orders    Ambulatory referral to Vascular Surgery       I am having Mr. Dethloff start on furosemide. I am also having him maintain his multivitamin with minerals, Insulin Pen Needle, canagliflozin, BAYER CONTOUR TEST, VICTOZA, metFORMIN, atorvastatin, amLODipine, aspirin, glipiZIDE, lisinopril, and doxycycline.  Meds ordered this encounter  Medications  . doxycycline (VIBRAMYCIN) 100 MG capsule    Sig: Take 100 mg by mouth 2 (two) times daily. For 14 days    Refill:  0  . furosemide (LASIX) 20 MG tablet    Sig: Take 1 tablet (20 mg total) by mouth daily.    Dispense:  30 tablet    Refill:   0    There are no discontinued medications.  Follow-up: No Follow-up on file.   Sherlene ShamsULLO, Breckyn Troyer L, MD

## 2015-02-21 NOTE — Progress Notes (Signed)
Pre-visit discussion using our clinic review tool. No additional management support is needed unless otherwise documented below in the visit note.  

## 2015-02-21 NOTE — Patient Instructions (Signed)
Continue the doxycycline.  Trial of furosemide 20 mg daily for 3 days,  Take in the morning.  Weigh yourself tomorrow morning BEFORE you start the medication   Venus ultrasound to be ordered through [facility] to evaluate for venous insufficiency

## 2015-02-24 DIAGNOSIS — T148XXA Other injury of unspecified body region, initial encounter: Secondary | ICD-10-CM | POA: Insufficient documentation

## 2015-02-24 NOTE — Assessment & Plan Note (Addendum)
He has completed a course of keflex and is now taking doxycycline given the CT results showing inflammation in the lower leg .  Advised to continue the antibiotic given the persistent redness.  Referral for venous ultrasound to rule out venous insufficiency as cause for persistent redness without warmth .  Adding a few days of furosemide for edema.

## 2015-02-24 NOTE — Assessment & Plan Note (Signed)
Right leg ,  Incised by ER physician after CT suggested either abscess or hematoma.   Reassurance provided that the rest of the hematoma would be resorbed by body over time. ,  Advised to apply cool compressess or warm compresses,  Or alternate, whichever felt better

## 2015-03-03 ENCOUNTER — Encounter: Payer: Self-pay | Admitting: Internal Medicine

## 2015-04-19 ENCOUNTER — Encounter: Payer: Self-pay | Admitting: Internal Medicine

## 2015-04-20 ENCOUNTER — Other Ambulatory Visit: Payer: Self-pay | Admitting: Internal Medicine

## 2015-05-07 ENCOUNTER — Other Ambulatory Visit: Payer: Self-pay | Admitting: Internal Medicine

## 2015-07-03 ENCOUNTER — Encounter: Payer: Self-pay | Admitting: Internal Medicine

## 2015-07-03 ENCOUNTER — Ambulatory Visit (INDEPENDENT_AMBULATORY_CARE_PROVIDER_SITE_OTHER): Payer: BC Managed Care – PPO | Admitting: Internal Medicine

## 2015-07-03 VITALS — BP 146/70 | HR 98 | Temp 98.2°F | Resp 12 | Ht 71.0 in | Wt 218.5 lb

## 2015-07-03 DIAGNOSIS — I872 Venous insufficiency (chronic) (peripheral): Secondary | ICD-10-CM | POA: Diagnosis not present

## 2015-07-03 DIAGNOSIS — I10 Essential (primary) hypertension: Secondary | ICD-10-CM

## 2015-07-03 DIAGNOSIS — Z23 Encounter for immunization: Secondary | ICD-10-CM | POA: Diagnosis not present

## 2015-07-03 DIAGNOSIS — R21 Rash and other nonspecific skin eruption: Secondary | ICD-10-CM

## 2015-07-03 MED ORDER — HYDROCHLOROTHIAZIDE 25 MG PO TABS: 25.0000 mg | ORAL_TABLET | Freq: Every day | ORAL | Status: DC

## 2015-07-03 NOTE — Progress Notes (Signed)
Subjective:  Patient ID: Joshua Arellano, male    DOB: 02/20/1966  Age: 49 y.o. MRN: [id number]  CC: The primary encounter diagnosis was Encounter for immunization. Diagnoses of Need for prophylactic vaccination against Streptococcus pneumoniae (pneumococcus), Rash and nonspecific skin eruption, and Essential hypertension were also pertinent to this visit.  [facility] for right leg rash which he first noticed on Friday .  Symptoms did not include itching or burning/pain.. No recent swimming or travel, was concerned it might be shingles. The rash started as papules , discrete,  And has not spread .  No leg swelling.  No recent change in medications.  Soaps or detergents. No fevers.    TAKING VALSARTAN 80 MG FOR BP    Outpatient Prescriptions Prior to Visit  Medication Sig Dispense Refill  . amLODipine (NORVASC) 10 MG tablet Take 1 tablet (10 mg total) by mouth daily. 90 tablet 1  . aspirin 81 MG chewable tablet CHEW AND SWALLOW 1 TABLET BY MOUTH EVERY DAY 90 tablet 1  . atorvastatin (LIPITOR) 20 MG tablet TAKE 1 TABLET BY MOUTH EVERY DAY 90 tablet 2  . BAYER CONTOUR TEST test strip USE AS DIRECTED 150 each 1  . Canagliflozin 300 MG TABS Take 1 tablet by mouth daily.    Marland Kitchen. glipiZIDE (GLUCOTROL) 5 MG tablet Take 0.5 tablets (2.5 mg total) by mouth daily before breakfast. 60 tablet 3  . Insulin Pen Needle 31G X 8 MM MISC by Does not apply route.    . metFORMIN (GLUCOPHAGE-XR) 500 MG 24 hr tablet Take 2,000 mg by mouth daily.  4  . Multiple Vitamins-Minerals (MULTIVITAMIN WITH MINERALS) tablet Take 1 tablet by mouth daily.    Marland Kitchen. VICTOZA 18 MG/3ML SOPN Inject 1.8 mg into the skin daily.  1  . doxycycline (VIBRAMYCIN) 100 MG capsule Take 100 mg by mouth 2 (two) times daily. For 14 days  0  . furosemide (LASIX) 20 MG tablet Take 1 tablet (20 mg total) by mouth daily. 30 tablet 0   No facility-administered medications prior to visit.    Review of  Systems;  Patient denies headache, fevers, malaise, unintentional weight loss, skin rash, eye pain, sinus congestion and sinus pain, sore throat, dysphagia,  hemoptysis , cough, dyspnea, wheezing, chest pain, palpitations, orthopnea, edema, abdominal pain, nausea, melena, diarrhea, constipation, flank pain, dysuria, hematuria, urinary  Frequency, nocturia, numbness, tingling, seizures,  Focal weakness, Loss of consciousness,  Tremor, insomnia, depression, anxiety, and suicidal ideation.      Objective:  BP 146/70 mmHg  Pulse 98  Temp(Src) 98.2 F (36.8 C) (Oral)  Resp 12  Ht 5\' 11"  (1.803 m)  Wt 218 lb 8 oz (99.111 kg)  BMI 30.49 kg/m2  SpO2 100%  BP Readings from Last 3 Encounters:  07/03/15 146/70  02/21/15 144/80  02/19/15 133/84    Wt Readings from Last 3 Encounters:  07/03/15 218 lb 8 oz (99.111 kg)  02/21/15 218 lb 8 oz (99.111 kg)  02/19/15 214 lb (97.07 kg)    General appearance: alert, cooperative and appears stated age arotid bruit, supple, symmetrical, trachea midline and thyroid not enlarged, symmetric, no tenderness/mass/nodules Back: symmetric, no curvature. ROM normal. No CVA tenderness. Lungs: clear to auscultation bilaterally Heart: regular rate and rhythm, S1, S2 normal, no murmur, click, rub or gallop Skin: excoriated maculopapular rash right lateral tibia. Lymph: Cervical, supraclavicular, and axillary nodes normal.  Lab Results  Component Value Date   HGBA1C 7.6* 02/09/2015   HGBA1C 7.1*  11/07/2014   HGBA1C 8.2* 07/25/2014    Lab Results  Component Value Date   CREATININE 0.92 02/09/2015   CREATININE 0.95 12/23/2014   CREATININE 0.9 07/25/2014    Lab Results  Component Value Date   WBC 7.8 02/09/2015   HGB 14.9 02/09/2015   HCT 43.6 02/09/2015   PLT 324.0 02/09/2015   GLUCOSE 113* 02/09/2015   CHOL 132 02/09/2015   TRIG 104.0 02/09/2015   HDL 33.60* 02/09/2015   LDLDIRECT 82 12/23/2014   LDLCALC 78 02/09/2015   ALT 20 02/09/2015    AST 15 02/09/2015   NA 142 02/09/2015   K 4.3 02/09/2015   CL 105 02/09/2015   CREATININE 0.92 02/09/2015   BUN 9 02/09/2015   CO2 30 02/09/2015   TSH 1.32 02/09/2015   PSA 0.45 02/09/2015   HGBA1C 7.6* 02/09/2015   MICROALBUR <0.7 02/09/2015    No results found.  Assessment & Plan:   Problem List Items Addressed This Visit    Hypertension    Improved with valsartan 80 mg but not at goal.  adding hct      Relevant Medications   valsartan (DIOVAN) 80 MG tablet   hydrochlorothiazide (HYDRODIURIL) 25 MG tablet   Rash and nonspecific skin eruption    Given the dermatomal distribution, herpetiform pattern ,  Shingles is a possibility but symptoms did not  include pain or burning.  No indication for treatment        Other Visit Diagnoses    Encounter for immunization    -  Primary    Need for prophylactic vaccination against Streptococcus pneumoniae (pneumococcus)        Relevant Orders    Pneumococcal polysaccharide vaccine 23-valent greater than or equal to 2yo subcutaneous/IM (Completed)       I have discontinued Joshua Arellano's doxycycline and furosemide. I am also having him start on hydrochlorothiazide. Additionally, I am having him maintain his multivitamin with minerals, Insulin Pen Needle, canagliflozin, BAYER CONTOUR TEST, VICTOZA, metFORMIN, atorvastatin, amLODipine, glipiZIDE, aspirin, and valsartan.  Meds ordered this encounter  Medications  . valsartan (DIOVAN) 80 MG tablet    Sig: Take 80 mg by mouth daily.  . hydrochlorothiazide (HYDRODIURIL) 25 MG tablet    Sig: Take 1 tablet (25 mg total) by mouth daily.    Dispense:  90 tablet    Refill:  3    Medications Discontinued During This Encounter  Medication Reason  . doxycycline (VIBRAMYCIN) 100 MG capsule Completed Course  . furosemide (LASIX) 20 MG tablet Completed Course    Follow-up: Return in about 6 months (around 12/31/2015).   Sherlene Shams, MD

## 2015-07-03 NOTE — Progress Notes (Signed)
Pre-visit discussion using our clinic review tool. No additional management support is needed unless otherwise documented below in the visit note.  

## 2015-07-03 NOTE — Patient Instructions (Signed)
Your resolving rash may have been a very mild case of shingles given the pattern of the bumps.  The next episode can be treated with acyclovir if it burns or hurts significantly.  I am adding HCTZ for you blood pressure.  This can be combined with valsartan at some point in a combination pill.   You received the Pneumovax and the Influenza vaccines today

## 2015-07-04 NOTE — Assessment & Plan Note (Signed)
Given the dermatomal distribution, herpetiform pattern ,  Shingles is a possibility but symptoms did not  include pain or burning.  No indication for treatment

## 2015-07-04 NOTE — Assessment & Plan Note (Addendum)
Improved with valsartan 80 mg but not at goal.  adding hct

## 2015-07-05 ENCOUNTER — Telehealth: Payer: Self-pay | Admitting: Internal Medicine

## 2015-07-05 DIAGNOSIS — I872 Venous insufficiency (chronic) (peripheral): Secondary | ICD-10-CM | POA: Insufficient documentation

## 2015-07-05 NOTE — Telephone Encounter (Signed)
My Chart message sent

## 2015-07-10 ENCOUNTER — Other Ambulatory Visit: Payer: Self-pay | Admitting: Internal Medicine

## 2015-08-29 ENCOUNTER — Other Ambulatory Visit: Payer: Self-pay | Admitting: Family Medicine

## 2015-08-29 ENCOUNTER — Ambulatory Visit (INDEPENDENT_AMBULATORY_CARE_PROVIDER_SITE_OTHER): Payer: BC Managed Care – PPO | Admitting: Family Medicine

## 2015-08-29 ENCOUNTER — Encounter: Payer: Self-pay | Admitting: Family Medicine

## 2015-08-29 ENCOUNTER — Ambulatory Visit (INDEPENDENT_AMBULATORY_CARE_PROVIDER_SITE_OTHER)
Admission: RE | Admit: 2015-08-29 | Discharge: 2015-08-29 | Disposition: A | Discharge status: Home / Self Care | Payer: BC Managed Care – PPO | Source: Ambulatory Visit | Attending: Family Medicine | Admitting: Family Medicine

## 2015-08-29 VITALS — BP 110/68 | HR 88 | Temp 97.4°F | Ht 71.0 in | Wt 221.8 lb

## 2015-08-29 DIAGNOSIS — E119 Type 2 diabetes mellitus without complications: Secondary | ICD-10-CM | POA: Diagnosis not present

## 2015-08-29 DIAGNOSIS — R5383 Other fatigue: Secondary | ICD-10-CM

## 2015-08-29 LAB — HEMOGLOBIN A1C: Hgb A1c MFr Bld: 7.5 % — ABNORMAL HIGH (ref 4.6–6.5)

## 2015-08-29 LAB — CBC
HCT: 46.5 % (ref 39.0–52.0)
HEMOGLOBIN: 15.5 g/dL (ref 13.0–17.0)
MCHC: 33.3 g/dL (ref 30.0–36.0)
MCV: 86.7 fl (ref 78.0–100.0)
PLATELETS: 375 10*3/uL (ref 150.0–400.0)
RBC: 5.36 Mil/uL (ref 4.22–5.81)
RDW: 13 % (ref 11.5–15.5)
WBC: 10.3 10*3/uL (ref 4.0–10.5)

## 2015-08-29 LAB — COMPREHENSIVE METABOLIC PANEL
ALK PHOS: 64 U/L (ref 39–117)
ALT: 28 U/L (ref 0–53)
AST: 21 U/L (ref 0–37)
Albumin: 4.5 g/dL (ref 3.5–5.2)
BILIRUBIN TOTAL: 0.4 mg/dL (ref 0.2–1.2)
BUN: 8 mg/dL (ref 6–23)
CO2: 31 mEq/L (ref 19–32)
Calcium: 9.9 mg/dL (ref 8.4–10.5)
Chloride: 101 mEq/L (ref 96–112)
Creatinine, Ser: 1.01 mg/dL (ref 0.40–1.50)
GFR: 82.6 mL/min (ref >60.00)
Glucose, Bld: 159 mg/dL — ABNORMAL HIGH (ref 70–99)
Potassium: 4.4 mEq/L (ref 3.5–5.1)
Sodium: 140 mEq/L (ref 135–145)
TOTAL PROTEIN: 6.7 g/dL (ref 6.0–8.3)

## 2015-08-29 LAB — TSH: TSH: 0.64 u[IU]/mL (ref 0.35–4.50)

## 2015-08-29 NOTE — Assessment & Plan Note (Signed)
New problem, acute (new to me). Unclear etiology. Patient is concerned about underlying pneumonia, particularly mycoplasma given recent illness.  His lung exam was normal today. Will obtain chest x-ray to assess. Additionally, obtaining lab work today. I suspect that this is likely multifactorial given patient's past medical history.

## 2015-08-29 NOTE — Patient Instructions (Signed)
It was nice to see you today.  We will call with your lab work results and chest xray results.   Follow up:  As scheduled with PCP.  Take care  Dr. Adriana Simas

## 2015-08-29 NOTE — Progress Notes (Signed)
Subjective:  Patient ID: Joshua Arellano, male    DOB: 22-Sep-1965  Age: 50 y.o. MRN: [id number]  CC: Fatigue  HPI:  50 year old male with past medical history of DM-2 (not at goal), hypertension, hyperlipidemia, depression, OSA presents for an acute visit with complaints of fatigue.  Fatigue  Patient states that approximately 2-2-1/2 weeks ago he had what he thought was a viral infection/cold.  His symptoms slowly resolved.  However, he developed fatigue.  He reports that he has severe fatigue at this time. He states that he has little energy and feels drained.  He is concerned that he may have pneumonia.  He states that he has a mild cough. No associated shortness of breath or fever.  No exacerbating or relieving factors.  Social Hx   Social History   Social History  . Marital Status: Married    Spouse Name: N/A  . Number of Children: N/A  . Years of Education: N/A   Social History Main Topics  . Smoking status: Never Smoker   . Smokeless tobacco: Never Used  . Alcohol Use: Yes     Comment: rare  . Drug Use: No  . Sexual Activity: Not Asked   Other Topics Concern  . None   Social History Narrative   Review of Systems  Constitutional: Positive for fatigue. Negative for fever.  Respiratory: Positive for cough. Negative for shortness of breath.    Objective:  BP 110/68 mmHg  Pulse 88  Temp(Src) 97.4 F (36.3 C) (Oral)  Ht _0  (1.803 m)  Wt 221 lb 12 oz (100.585 kg)  BMI 30.94 kg/m2  SpO2 98%  BP/Weight 08/29/2015 07/03/2015 03/16/7516  Systolic BP 001 749 449  Diastolic BP 68 70 80  Wt. (Lbs) 221.75 218.5 218.5  BMI 30.94 30.49 30.49   Physical Exam  Constitutional: He is oriented to person, place, and time. He appears well-developed. No distress.  HENT:  Head: Normocephalic and atraumatic.  Mouth/Throat: Oropharynx is clear and moist.  Eyes: Conjunctivae are normal.  Cardiovascular: Normal rate and regular rhythm.   Pulmonary/Chest:  Effort normal and breath sounds normal. No respiratory distress. He has no wheezes. He has no rales.  Neurological: He is alert and oriented to person, place, and time.  Psychiatric: He has a normal mood and affect.  Vitals reviewed.  Lab Results  Component Value Date   WBC 7.8 02/09/2015   HGB 14.9 02/09/2015   HCT 43.6 02/09/2015   PLT 324.0 02/09/2015   GLUCOSE 113* 02/09/2015   CHOL 132 02/09/2015   TRIG 104.0 02/09/2015   HDL 33.60* 02/09/2015   LDLDIRECT 82 12/23/2014   LDLCALC 78 02/09/2015   ALT 20 02/09/2015   AST 15 02/09/2015   NA 142 02/09/2015   K 4.3 02/09/2015   CL 105 02/09/2015   CREATININE 0.92 02/09/2015   BUN 9 02/09/2015   CO2 30 02/09/2015   TSH 1.32 02/09/2015   PSA 0.45 02/09/2015   HGBA1C 7.6* 02/09/2015   MICROALBUR <0.7 02/09/2015    Assessment & Plan:   Problem List Items Addressed This Visit    Diabetes mellitus without complication ([place])   Relevant Orders   HgB A1c    Other Visit Diagnoses    Other fatigue    -  Primary    Relevant Orders    CBC    Comp Met (CMET)    TSH    DG Chest 2 View      Follow-up: Return if symptoms worsen or  fail to improve.  [place]

## 2015-10-20 ENCOUNTER — Encounter: Payer: Self-pay | Admitting: Internal Medicine

## 2015-10-23 ENCOUNTER — Ambulatory Visit (INDEPENDENT_AMBULATORY_CARE_PROVIDER_SITE_OTHER): Payer: BC Managed Care – PPO | Admitting: Internal Medicine

## 2015-10-23 ENCOUNTER — Encounter: Payer: Self-pay | Admitting: Internal Medicine

## 2015-10-23 VITALS — BP 128/78 | HR 82 | Temp 97.9°F | Resp 12 | Ht 71.0 in | Wt 222.8 lb

## 2015-10-23 DIAGNOSIS — G4733 Obstructive sleep apnea (adult) (pediatric): Secondary | ICD-10-CM

## 2015-10-23 DIAGNOSIS — R5383 Other fatigue: Secondary | ICD-10-CM

## 2015-10-23 DIAGNOSIS — I10 Essential (primary) hypertension: Secondary | ICD-10-CM | POA: Diagnosis not present

## 2015-10-23 DIAGNOSIS — E669 Obesity, unspecified: Secondary | ICD-10-CM | POA: Diagnosis not present

## 2015-10-23 DIAGNOSIS — E785 Hyperlipidemia, unspecified: Secondary | ICD-10-CM

## 2015-10-23 MED ORDER — ZOLPIDEM TARTRATE 5 MG PO TABS: 5.0000 mg | ORAL_TABLET | Freq: Every evening | ORAL | Status: DC | PRN

## 2015-10-23 NOTE — Progress Notes (Signed)
Subjective:  Patient ID: Joshua Arellano, male    DOB: 01-Oct-1965  Age: 50 y.o. MRN: [id number]  CC: The primary encounter diagnosis was OSA (obstructive sleep apnea). Diagnoses of Other fatigue, Obesity (BMI 30-39.9), Essential hypertension, and Dyslipidemia, goal LDL below 70 were also pertinent to this visit.  HPI [facility] presents for folllow up on persistent fatigue. He was evaluated for same by Dr Adriana Simas in late January.  CBC, Thyroid screens normal. Chest x ray normal . A1c 7.5   DM managed by endocrine at [facility] [place].   He reports persistent fatigue excessive daytime sleepiness if not active.  Symptoms has worsened since January.  Has to nap almost daily .  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   Records reviewed.  He has a history of  severe OSA diagnosed in 2007 but has never sought treatment  due to the cost of the supplies .  Wife reports continued snoring and he wakes up tired daily. Concerned that the lipitor is causing his fatigue.   DM: managed by [facility].  Has been taking Jardiance since [place] was stopped for lack of coverage by insurance.  Notes that his BS 150 give or take 5  Pts. Still taking Victoza .    Outpatient Prescriptions Prior to Visit  Medication Sig Dispense Refill  . amLODipine (NORVASC) 10 MG tablet TAKE 1 TABLET(10 MG) BY MOUTH DAILY 90 tablet 1  . aspirin 81 MG chewable tablet CHEW AND SWALLOW 1 TABLET BY MOUTH EVERY DAY 90 tablet 1  . BAYER CONTOUR TEST test strip USE AS DIRECTED 150 each 1  . glipiZIDE (GLUCOTROL) 5 MG tablet Take 0.5 tablets (2.5 mg total) by mouth daily before breakfast. 60 tablet 3  . hydrochlorothiazide (HYDRODIURIL) 25 MG tablet Take 1 tablet (25 mg total) by mouth daily. 90 tablet 3  . Insulin Pen Needle 31G X 8 MM MISC by Does not apply route.    . metFORMIN (GLUCOPHAGE-XR) 500 MG 24 hr tablet Take 2,000 mg by mouth daily.  4  . Multiple Vitamins-Minerals (MULTIVITAMIN WITH MINERALS)  tablet Take 1 tablet by mouth daily.    . valsartan (DIOVAN) 80 MG tablet Take 80 mg by mouth daily.    Marland Kitchen VICTOZA 18 MG/3ML SOPN Inject 1.8 mg into the skin daily.  1  . atorvastatin (LIPITOR) 20 MG tablet TAKE 1 TABLET BY MOUTH EVERY DAY (Patient not taking: Reported on 10/23/2015) 90 tablet 2  . Canagliflozin 300 MG TABS Take 1 tablet by mouth daily. Reported on 10/23/2015     No facility-administered medications prior to visit.    Review of Systems;  Patient denies headache, fevers, malaise, unintentional weight loss, skin rash, eye pain, sinus congestion and sinus pain, sore throat, dysphagia,  hemoptysis , cough, dyspnea, wheezing, chest pain, palpitations, orthopnea, edema, abdominal pain, nausea, melena, diarrhea, constipation, flank pain, dysuria, hematuria, urinary  Frequency, nocturia, numbness, tingling, seizures,  Focal weakness, Loss of consciousness,  Tremor, insomnia, depression, anxiety, and suicidal ideation.      Objective:  BP 128/78 mmHg  Pulse 82  Temp(Src) 97.9 F (36.6 C) (Oral)  Resp 12  Ht  (1.803 m)  Wt 222 lb 12 oz (101.039 kg)  BMI 31.08 kg/m2  SpO2 97%  BP Readings from Last 3 Encounters:  10/23/15 128/78  08/29/15 110/68  07/03/15 146/70    Wt Readings from Last 3 Encounters:  10/23/15 222 lb 12 oz (101.039 kg)  08/29/15 221 lb 12  oz (100.585 kg)  07/03/15 218 lb 8 oz (99.111 kg)    General appearance: alert, cooperative and appears stated age Ears: normal TM's and external ear canals both ears Throat: lips, mucosa, and tongue normal; teeth and gums normal Neck: no adenopathy, no carotid bruit, supple, symmetrical, trachea midline and thyroid not enlarged, symmetric, no tenderness/mass/nodules Back: symmetric, no curvature. ROM normal. No CVA tenderness. Lungs: clear to auscultation bilaterally Heart: regular rate and rhythm, S1, S2 normal, no murmur, click, rub or gallop Abdomen: soft, non-tender; bowel sounds normal; no masses,  no  organomegaly Pulses: 2+ and symmetric Skin: Skin color, texture, turgor normal. No rashes or lesions Lymph nodes: Cervical, supraclavicular, and axillary nodes normal.  Lab Results  Component Value Date   HGBA1C 7.5* 08/29/2015   HGBA1C 7.6* 02/09/2015   HGBA1C 7.1* 11/07/2014    Lab Results  Component Value Date   CREATININE 1.01 08/29/2015   CREATININE 0.92 02/09/2015   CREATININE 0.95 12/23/2014    Lab Results  Component Value Date   WBC 10.3 08/29/2015   HGB 15.5 08/29/2015   HCT 46.5 08/29/2015   PLT 375.0 08/29/2015   GLUCOSE 159* 08/29/2015   CHOL 132 02/09/2015   TRIG 104.0 02/09/2015   HDL 33.60* 02/09/2015   LDLDIRECT 82 12/23/2014   LDLCALC 78 02/09/2015   ALT 28 08/29/2015   AST 21 08/29/2015   NA 140 08/29/2015   K 4.4 08/29/2015   CL 101 08/29/2015   CREATININE 1.01 08/29/2015   BUN 8 08/29/2015   CO2 31 08/29/2015   TSH 0.64 08/29/2015   PSA 0.45 02/09/2015   HGBA1C 7.5* 08/29/2015   MICROALBUR <0.7 02/09/2015    Dg Chest 2 View  08/29/2015  CLINICAL DATA:  Fatigue and cough EXAM: CHEST  2 VIEW COMPARISON:  None. FINDINGS: The heart size and mediastinal contours are within normal limits. Both lungs are clear. The visualized skeletal structures are unremarkable. IMPRESSION: No active cardiopulmonary disease. Electronically Signed   By: Alcide Clever M.D.   On: 08/29/2015 16:29    Assessment & Plan:   Problem List Items Addressed This Visit    Dyslipidemia, goal LDL below 70    He is requesting suspension of Lipitor due to feeling so poorly.  Ok to suspend for a few weeks.       Hypertension    Now  at goal on valsartan and  Hct.  Lab Results  Component Value Date   CREATININE 1.01 08/29/2015   Lab Results  Component Value Date   NA 140 08/29/2015   K 4.4 08/29/2015   CL 101 08/29/2015   CO2 31 08/29/2015           Obesity (BMI 30-39.9)    I have addressed  BMI and recommended wt loss of 10% of body weigh over the next 6 months  using a low glycemic index diet and regular exercise a minimum of 5 days per week.        Relevant Medications   JARDIANCE 25 MG TABS tablet   OSA (obstructive sleep apnea) - Primary   Relevant Orders   Nocturnal polysomnography (NPSG)   Fatigue    Given his normal thyroid and CBC screen in January and his history of untreated OSA by 2007 study,  Strongly advised him to have repeat testing .   Discussed the long term history of OSA , the risks of long term damage to heart and the signs and symptoms attributable to OSA.  Advised patient to  consider  significant weight loss and/or use of CPAP if indicated by repeat testing .       Relevant Orders   B12 (Completed)   Folate RBC      I am having Mr. Tarbet start on zolpidem. I am also having him maintain his multivitamin with minerals, Insulin Pen Needle, canagliflozin, BAYER CONTOUR TEST, VICTOZA, metFORMIN, atorvastatin, glipiZIDE, aspirin, valsartan, hydrochlorothiazide, amLODipine, and JARDIANCE.  Meds ordered this encounter  Medications  . JARDIANCE 25 MG TABS tablet    Sig: Take 1 tablet by mouth daily.    Refill:  3  . zolpidem (AMBIEN) 5 MG tablet    Sig: Take 1 tablet (5 mg total) by mouth at bedtime as needed for sleep. DO NOT USE WITHOUT CPAP    Dispense:  15 tablet    Refill:  1   A total of 25 minutes of face to face time was spent with patient more than half of which was spent in counselling about the above mentioned conditions  and coordination of care  There are no discontinued medications.  Follow-up: No Follow-up on file.   Sherlene ShamsULLO, Deepti Gunawan L, MD

## 2015-10-23 NOTE — Progress Notes (Signed)
Pre-visit discussion using our clinic review tool. No additional management support is needed unless otherwise documented below in the visit note.  

## 2015-10-23 NOTE — Patient Instructions (Signed)
   Suspend the Lipitor until your physical  I think your fatigue is coming from untreated sleep apnea  [facility]  Save the [place] for the sleep study.

## 2015-10-24 LAB — VITAMIN B12: Vitamin B-12: 325 pg/mL (ref 211–911)

## 2015-10-24 NOTE — Assessment & Plan Note (Signed)
Given his normal thyroid and CBC screen in January and his history of untreated OSA by 2007 study,  Strongly advised him to have repeat testing .   Discussed the long term history of OSA , the risks of long term damage to heart and the signs and symptoms attributable to OSA.  Advised patient to consider  significant weight loss and/or use of CPAP if indicated by repeat testing .

## 2015-10-24 NOTE — Assessment & Plan Note (Signed)
He is requesting suspension of Lipitor due to feeling so poorly.  Ok to suspend for a few weeks.

## 2015-10-24 NOTE — Assessment & Plan Note (Signed)
Now  at goal on valsartan and  Hct.  Lab Results  Component Value Date   CREATININE 1.01 08/29/2015   Lab Results  Component Value Date   NA 140 08/29/2015   K 4.4 08/29/2015   CL 101 08/29/2015   CO2 31 08/29/2015

## 2015-10-24 NOTE — Assessment & Plan Note (Signed)
I have addressed  BMI and recommended wt loss of 10% of body weigh over the next 6 months using a low glycemic index diet and regular exercise a minimum of 5 days per week.   

## 2015-10-25 ENCOUNTER — Encounter: Payer: Self-pay | Admitting: Internal Medicine

## 2015-10-25 LAB — FOLATE RBC: RBC Folate: 404 ng/mL (ref >280)

## 2015-10-26 ENCOUNTER — Ambulatory Visit: Payer: BC Managed Care – PPO | Attending: Internal Medicine

## 2015-10-26 DIAGNOSIS — Z6831 Body mass index (BMI) 31.0-31.9, adult: Secondary | ICD-10-CM | POA: Insufficient documentation

## 2015-10-26 DIAGNOSIS — G4733 Obstructive sleep apnea (adult) (pediatric): Secondary | ICD-10-CM | POA: Diagnosis present

## 2015-10-31 ENCOUNTER — Encounter: Payer: Self-pay | Admitting: Internal Medicine

## 2015-11-07 ENCOUNTER — Ambulatory Visit (INDEPENDENT_AMBULATORY_CARE_PROVIDER_SITE_OTHER): Payer: BC Managed Care – PPO | Admitting: Internal Medicine

## 2015-11-07 ENCOUNTER — Encounter: Payer: Self-pay | Admitting: Internal Medicine

## 2015-11-07 VITALS — BP 130/78 | HR 92 | Temp 97.9°F | Resp 12 | Ht 71.0 in | Wt 227.0 lb

## 2015-11-07 DIAGNOSIS — E119 Type 2 diabetes mellitus without complications: Secondary | ICD-10-CM

## 2015-11-07 DIAGNOSIS — Z0000 Encounter for general adult medical examination without abnormal findings: Secondary | ICD-10-CM

## 2015-11-07 DIAGNOSIS — E785 Hyperlipidemia, unspecified: Secondary | ICD-10-CM

## 2015-11-07 DIAGNOSIS — I10 Essential (primary) hypertension: Secondary | ICD-10-CM

## 2015-11-07 DIAGNOSIS — G4733 Obstructive sleep apnea (adult) (pediatric): Secondary | ICD-10-CM

## 2015-11-07 NOTE — Patient Instructions (Signed)

## 2015-11-07 NOTE — Assessment & Plan Note (Signed)
Study repeated and CPAP ordered.  Optimal pressure is 9 cm h20 . results dicussed with patient .

## 2015-11-07 NOTE — Progress Notes (Signed)
Pre-visit discussion using our clinic review tool. No additional management support is needed unless otherwise documented below in the visit note.  

## 2015-11-08 NOTE — Assessment & Plan Note (Signed)
Improving with use of invokanna, metformin and Victoza, but not at goal.  he has no proteinuria by current urinalysis.  Added glipizide at last visit .   Reminder for annual eye exam given.  Lab Results  Component Value Date   HGBA1C 7.5* 08/29/2015   Lab Results  Component Value Date   MICROALBUR <0.7 02/09/2015   Lab Results  Component Value Date   CHOL 132 02/09/2015   HDL 33.60* 02/09/2015   LDLCALC 78 02/09/2015   LDLDIRECT 82 12/23/2014   TRIG 104.0 02/09/2015   CHOLHDL 4 02/09/2015      Foot exam is normal today

## 2015-11-08 NOTE — Assessment & Plan Note (Signed)

## 2015-11-08 NOTE — Progress Notes (Signed)
Patient ID: Joshua Arellano, male    DOB: 10-04-1965  Age: 50 y.o. MRN: [id number]  The patient is here for annual Medicare wellness examination and management of other chronic and acute problems.   The risk factors are reflected in the social history.  The roster of all physicians providing medical care to patient - is listed in the Snapshot section of the chart.  Activities of daily living:  The patient is 100% independent in all ADLs: dressing, toileting, feeding as well as independent mobility  Home safety : The patient has smoke detectors in the home. They wear seatbelts.  There are no firearms at home. There is no violence in the home.   There is no risks for hepatitis, STDs or HIV. There is no   history of blood transfusion. They have no travel history to infectious disease endemic areas of the world.  The patient has seen their dentist in the last six month. They have seen their eye doctor in the last year. They admit to slight hearing difficulty with regard to whispered voices and some television programs.  They have deferred audiologic testing in the last year.  They do not  have excessive sun exposure. Discussed the need for sun protection: hats, long sleeves and use of sunscreen if there is significant sun exposure.   Diet: the importance of a healthy diet is discussed. They do have a healthy diet.  The benefits of regular aerobic exercise were discussed. She walks 4 times per week ,  20 minutes.   Depression screen: there are no signs or vegative symptoms of depression- irritability, change in appetite, anhedonia, sadness/tearfullness.  Cognitive assessment: the patient manages all their financial and personal affairs and is actively engaged. They could relate day,date,year and events; recalled 2/3 objects at 3 minutes; performed clock-face test normally.  The following portions of the patient's history were reviewed and updated as appropriate: allergies, current  medications, past family history, past medical history,  past surgical history, past social history  and problem list.  Visual acuity was not assessed per patient preference since she has regular follow up with her ophthalmologist. Hearing and body mass index were assessed and reviewed.   During the course of the visit the patient was educated and counseled about appropriate screening and preventive services including : fall prevention , diabetes screening, nutrition counseling, colorectal cancer screening, and recommended immunizations.    CC: The primary encounter diagnosis was Diabetes mellitus without complication (HCC). Diagnoses of Essential hypertension, OSA (obstructive sleep apnea), Routine general medical examination at a health care facility, and Dyslipidemia, goal LDL below 70 were also pertinent to this visit.  Excessive fatigue.  positive sleep study .  The results of patient's sleep study were reviewed with him today  [name] has a past medical history of Diabetes mellitus; Hyperlipidemia; Hypertension; Obesity (BMI 30-39.9); Hemorrhoid; and Knee pain, left.   He has past surgical history that includes Tonsillectomy.   His family history includes Cancer in his maternal uncle; Heart disease in his maternal grandfather; Hypertension in his maternal grandfather, paternal grandfather, and paternal grandmother; Stroke in his father.He reports that he has never smoked. He has never used smokeless tobacco. He reports that he drinks alcohol. He reports that he does not use illicit drugs.  Outpatient Prescriptions Prior to Visit  Medication Sig Dispense Refill  . amLODipine (NORVASC) 10 MG tablet TAKE 1 TABLET(10 MG) BY MOUTH DAILY 90 tablet 1  . aspirin 81 MG chewable tablet CHEW AND SWALLOW  1 TABLET BY MOUTH EVERY DAY 90 tablet 1  . BAYER CONTOUR TEST test strip USE AS DIRECTED 150 each 1  . glipiZIDE (GLUCOTROL) 5 MG tablet Take 0.5 tablets (2.5 mg total) by mouth daily before  breakfast. 60 tablet 3  . hydrochlorothiazide (HYDRODIURIL) 25 MG tablet Take 1 tablet (25 mg total) by mouth daily. 90 tablet 3  . Insulin Pen Needle 31G X 8 MM MISC by Does not apply route.    Marland Kitchen JARDIANCE 25 MG TABS tablet Take 1 tablet by mouth daily.  3  . metFORMIN (GLUCOPHAGE-XR) 500 MG 24 hr tablet Take 2,000 mg by mouth daily.  4  . Multiple Vitamins-Minerals (MULTIVITAMIN WITH MINERALS) tablet Take 1 tablet by mouth daily.    . valsartan (DIOVAN) 80 MG tablet Take 80 mg by mouth daily.    Marland Kitchen VICTOZA 18 MG/3ML SOPN Inject 1.8 mg into the skin daily.  1  . atorvastatin (LIPITOR) 20 MG tablet TAKE 1 TABLET BY MOUTH EVERY DAY (Patient not taking: Reported on 10/23/2015) 90 tablet 2  . zolpidem (AMBIEN) 5 MG tablet Take 1 tablet (5 mg total) by mouth at bedtime as needed for sleep. DO NOT USE WITHOUT CPAP (Patient not taking: Reported on 11/07/2015) 15 tablet 1  . Canagliflozin 300 MG TABS Take 1 tablet by mouth daily. Reported on 10/23/2015     No facility-administered medications prior to visit.    Review of Systems   Patient denies headache, fevers, , unintentional weight loss, skin rash, eye pain, sinus congestion and sinus pain, sore throat, dysphagia,  hemoptysis , cough, dyspnea, wheezing, chest pain, palpitations, orthopnea, edema, abdominal pain, nausea, melena, diarrhea, constipation, flank pain, dysuria, hematuria, urinary  Frequency, nocturia, numbness, tingling, seizures,  Focal weakness, Loss of consciousness,  Tremor, insomnia, depression, anxiety, and suicidal ideation.      Objective:  BP 130/78 mmHg  Pulse 92  Temp(Src) 97.9 F (36.6 C) (Oral)  Resp 12  Ht  (1.803 m)  Wt 227 lb (102.967 kg)  BMI 31.67 kg/m2  SpO2 97%  Physical Exam  General appearance: obese, alert, cooperative and appears stated age Ears: normal TM's and external ear canals both ears Throat: lips, mucosa, and tongue normal; teeth and gums normal Neck: no adenopathy, no carotid bruit,  supple, symmetrical, trachea midline and thyroid not enlarged, symmetric, no tenderness/mass/nodules Back: symmetric, no curvature. ROM normal. No CVA tenderness. Lungs: clear to auscultation bilaterally Heart: regular rate and rhythm, S1, S2 normal, no murmur, click, rub or gallop Abdomen: soft, non-tender; bowel sounds normal; no masses,  no organomegaly Pulses: 2+ and symmetric Skin: Skin color, texture, turgor normal. No rashes or lesions Lymph nodes: Cervical, supraclavicular, and axillary nodes normal.  Assessment & Plan:   Problem List Items Addressed This Visit    Diabetes mellitus without complication (HCC) - Primary    Improving with use of invokanna, metformin and Victoza, but not at goal.  he has no proteinuria by current urinalysis.  Added glipizide at last visit .   Reminder for annual eye exam given.  Lab Results  Component Value Date   HGBA1C 7.5* 08/29/2015   Lab Results  Component Value Date   MICROALBUR <0.7 02/09/2015   Lab Results  Component Value Date   CHOL 132 02/09/2015   HDL 33.60* 02/09/2015   LDLCALC 78 02/09/2015   LDLDIRECT 82 12/23/2014   TRIG 104.0 02/09/2015   CHOLHDL 4 02/09/2015      Foot exam is normal today  Relevant Orders   Comprehensive metabolic panel   Hemoglobin A1c   Microalbumin / creatinine urine ratio   Dyslipidemia, goal LDL below 70    He has suspended Lipitor due to feeling so poorly.  Will repeat fasting lipids in 3 months  And resume if indicated       Routine general medical examination at a health care facility    Annual comprehensive preventive exam was done as well as an evaluation and management of acute and chronic conditions .  During the course of the visit the patient was educated and counseled about appropriate screening and preventive services including :  diabetes screening, lipid analysis with projected  10 year  risk for CAD , nutrition counseling, prostate and colorectal cancer screening,  and recommended immunizations.  Printed recommendations for health maintenance screenings was given.       OSA (obstructive sleep apnea)    Study repeated and CPAP ordered.  Optimal pressure is 9 cm h20 . results dicussed with patient .      Hypertension      I have discontinued Mr. Yost's canagliflozin. I am also having him maintain his multivitamin with minerals, Insulin Pen Needle, BAYER CONTOUR TEST, VICTOZA, metFORMIN, atorvastatin, glipiZIDE, aspirin, valsartan, hydrochlorothiazide, amLODipine, JARDIANCE, and zolpidem.  No orders of the defined types were placed in this encounter.    Medications Discontinued During This Encounter  Medication Reason  . Canagliflozin 300 MG TABS Change in therapy    Follow-up: No Follow-up on file.   Sherlene Shams, MD

## 2015-11-08 NOTE — Assessment & Plan Note (Signed)
He has suspended Lipitor due to feeling so poorly.  Will repeat fasting lipids in 3 months  And resume if indicated

## 2015-11-10 ENCOUNTER — Encounter: Payer: Self-pay | Admitting: Internal Medicine

## 2015-11-12 ENCOUNTER — Other Ambulatory Visit: Payer: Self-pay | Admitting: Internal Medicine

## 2015-11-29 ENCOUNTER — Encounter: Payer: Self-pay | Admitting: Internal Medicine

## 2015-11-30 ENCOUNTER — Encounter: Payer: Self-pay | Admitting: Internal Medicine

## 2015-12-03 NOTE — Telephone Encounter (Signed)
Joshua Arellano  Yes,  The fasting labs have been ordered. Please call to set up lab visit.   Regards,   Duncan Dulleresa Keari Miu, MD

## 2015-12-22 ENCOUNTER — Encounter: Payer: Self-pay | Admitting: Internal Medicine

## 2015-12-24 ENCOUNTER — Encounter: Payer: Self-pay | Admitting: Internal Medicine

## 2015-12-24 DIAGNOSIS — Z1211 Encounter for screening for malignant neoplasm of colon: Secondary | ICD-10-CM

## 2015-12-24 DIAGNOSIS — M25579 Pain in unspecified ankle and joints of unspecified foot: Secondary | ICD-10-CM

## 2015-12-25 NOTE — Telephone Encounter (Signed)
Patient asking for referral to Urology and for Colonoscopy.

## 2015-12-27 ENCOUNTER — Other Ambulatory Visit (INDEPENDENT_AMBULATORY_CARE_PROVIDER_SITE_OTHER): Payer: BC Managed Care – PPO

## 2015-12-27 ENCOUNTER — Encounter: Payer: Self-pay | Admitting: *Deleted

## 2015-12-27 DIAGNOSIS — M25579 Pain in unspecified ankle and joints of unspecified foot: Secondary | ICD-10-CM

## 2015-12-27 DIAGNOSIS — E119 Type 2 diabetes mellitus without complications: Secondary | ICD-10-CM

## 2015-12-27 LAB — COMPREHENSIVE METABOLIC PANEL
ALBUMIN: 4.2 g/dL (ref 3.5–5.2)
ALT: 19 U/L (ref 0–53)
AST: 17 U/L (ref 0–37)
Alkaline Phosphatase: 47 U/L (ref 39–117)
BUN: 11 mg/dL (ref 6–23)
CHLORIDE: 103 meq/L (ref 96–112)
CO2: 31 meq/L (ref 19–32)
CREATININE: 0.84 mg/dL (ref 0.40–1.50)
Calcium: 10.1 mg/dL (ref 8.4–10.5)
GFR: 102.05 mL/min (ref >60.00)
Glucose, Bld: 90 mg/dL (ref 70–99)
POTASSIUM: 3.6 meq/L (ref 3.5–5.1)
SODIUM: 139 meq/L (ref 135–145)
Total Bilirubin: 0.5 mg/dL (ref 0.2–1.2)
Total Protein: 6.5 g/dL (ref 6.0–8.3)

## 2015-12-27 LAB — HEMOGLOBIN A1C: HEMOGLOBIN A1C: 7.5 % — AB (ref 4.6–6.5)

## 2015-12-27 LAB — MICROALBUMIN / CREATININE URINE RATIO
Creatinine,U: 16.3 mg/dL
MICROALB/CREAT RATIO: 4.3 mg/g (ref 0.0–30.0)

## 2015-12-27 LAB — URIC ACID: URIC ACID, SERUM: 4.8 mg/dL (ref 4.0–7.8)

## 2015-12-27 LAB — C-REACTIVE PROTEIN: CRP: 0.1 mg/dL — ABNORMAL LOW (ref 0.5–20.0)

## 2015-12-27 LAB — SEDIMENTATION RATE: SED RATE: 4 mm/h (ref 0–20)

## 2015-12-30 ENCOUNTER — Other Ambulatory Visit: Payer: Self-pay | Admitting: Internal Medicine

## 2015-12-30 ENCOUNTER — Encounter: Payer: Self-pay | Admitting: Internal Medicine

## 2015-12-30 DIAGNOSIS — E785 Hyperlipidemia, unspecified: Secondary | ICD-10-CM

## 2016-01-02 ENCOUNTER — Telehealth: Payer: Self-pay | Admitting: Internal Medicine

## 2016-01-02 ENCOUNTER — Telehealth: Payer: Self-pay | Admitting: *Deleted

## 2016-01-02 NOTE — Telephone Encounter (Signed)
Clydie BraunKaren called that lipid cant be added, sample to old now

## 2016-01-02 NOTE — Telephone Encounter (Signed)
Documenting that we have received  data from CPAP machine for April use with  100% of the days used,  Average  6:30 hours . If you need the reports it will be in red folder

## 2016-01-02 NOTE — Telephone Encounter (Signed)
The only thing else we need is has to be face to face with in 90 days.

## 2016-01-23 ENCOUNTER — Ambulatory Visit: Payer: Self-pay | Admitting: General Surgery

## 2016-01-29 ENCOUNTER — Encounter: Payer: Self-pay | Admitting: General Surgery

## 2016-01-29 ENCOUNTER — Ambulatory Visit (INDEPENDENT_AMBULATORY_CARE_PROVIDER_SITE_OTHER): Payer: BC Managed Care – PPO | Admitting: General Surgery

## 2016-01-29 VITALS — BP 132/74 | HR 86 | Resp 14 | Ht 71.0 in | Wt 224.0 lb

## 2016-01-29 DIAGNOSIS — Z1211 Encounter for screening for malignant neoplasm of colon: Secondary | ICD-10-CM

## 2016-01-29 MED ORDER — POLYETHYLENE GLYCOL 3350 17 GM/SCOOP PO POWD: ORAL | Status: DC

## 2016-01-29 NOTE — Progress Notes (Signed)
Patient ID: Joshua Arellano, male   DOB: [birth year], 50 y.o.   MRN: [id number]  Chief Complaint  Patient presents with  . Colonoscopy    HPI Joshua Arellano is a 50 y.o. male here for a Colonoscopy discussion. He has not had one done prior. Moves his bowels regularly, usually twice daily. He does occasionally experience acid reflux, which he takes Tums. He states he may have IBS, he does have occasional diarrhea (Loose stools without mucus or blood) a couple times a week.  I personally reviewed the patient's history. HPI  Past Medical History  Diagnosis Date  . Diabetes mellitus   . Hyperlipidemia   . Hypertension   . Obesity (BMI 30-39.9)   . Hemorrhoid   . Knee pain, left     episodic  . Hemorrhoids     Past Surgical History  Procedure Laterality Date  . Tonsillectomy  1972  . Anoscopy  10 years ago    Hemorrhoids    Family History  Problem Relation Age of Onset  . Cancer Maternal Uncle   . Stroke Father   . Heart disease Maternal Grandfather   . Hypertension Maternal Grandfather   . Hypertension Paternal Grandmother   . Hypertension Paternal Grandfather     Social History Social History  Substance Use Topics  . Smoking status: Never Smoker   . Smokeless tobacco: Never Used  . Alcohol Use: Yes     Comment: rare    Allergies  Allergen Reactions  . Lisinopril Cough    cough    Current Outpatient Prescriptions  Medication Sig Dispense Refill  . aspirin 81 MG chewable tablet CHEW AND SWALLOW 1 TABLET BY MOUTH EVERY DAY 90 tablet 3  . atorvastatin (LIPITOR) 20 MG tablet TAKE 1 TABLET BY MOUTH EVERY DAY 90 tablet 2  . BAYER CONTOUR TEST test strip USE AS DIRECTED 150 each 1  . glipiZIDE (GLUCOTROL) 5 MG tablet Take 0.5 tablets (2.5 mg total) by mouth daily before breakfast. 60 tablet 3  . hydrochlorothiazide (HYDRODIURIL) 25 MG tablet Take 1 tablet (25 mg total) by mouth daily. 90 tablet 3  . JARDIANCE 25 MG TABS tablet Take 1 tablet by mouth  daily.  3  . metFORMIN (GLUCOPHAGE-XR) 500 MG 24 hr tablet Take 2,000 mg by mouth daily.  4  . Multiple Vitamins-Minerals (MULTIVITAMIN WITH MINERALS) tablet Take 1 tablet by mouth daily.    . valsartan (DIOVAN) 80 MG tablet Take 80 mg by mouth daily.    Marland Kitchen. VICTOZA 18 MG/3ML SOPN Inject 1.8 mg into the skin daily.  1  . amLODipine (NORVASC) 10 MG tablet TAKE 1 TABLET(10 MG) BY MOUTH DAILY 90 tablet 3  . polyethylene glycol powder (GLYCOLAX/MIRALAX) powder 255 grams one bottle for colonoscopy prep 255 g 0   No current facility-administered medications for this visit.    Review of Systems Review of Systems  Constitutional: Negative.   Respiratory: Negative.   Cardiovascular: Negative.   Gastrointestinal: Positive for diarrhea (depends on which foods).    Blood pressure 132/74, pulse 86, resp. rate 14, height 5\' 11"  (1.803 m), weight 224 lb (101.606 kg).  Physical Exam Physical Exam  Constitutional: He is oriented to person, place, and time. He appears well-developed and well-nourished.  Eyes: Conjunctivae are normal. No scleral icterus.  Neck: Neck supple.  Cardiovascular: Normal rate, regular rhythm and normal heart sounds.   Pulmonary/Chest: Effort normal and breath sounds normal.  Lymphadenopathy:    He has no cervical adenopathy.  Neurological:  He is alert and oriented to person, place, and time.  Skin: Skin is warm and dry.  Psychiatric: He has a normal mood and affect.    Data Reviewed PCP note of April 2017 reviewed. PCP notes report the patient fitted for CPAP unit.  2017 laboratory studies completed by PCP reviewed. Normal CBC.  Assessment    Added for screening colonoscopy.    Plan    Patient has been scheduled for a colonoscopy on 02-28-16 at [facility]. This patient will not change dose of Victoza or Jardiance but will hold glucotrol and decrease Metformin by half day or prep and procedure. Patient will only take blood pressure pill the day of colonoscopy at 6 am with  a small sip of water. It is okay for patient to continue 81 mg aspirin once daily.   If the patient has been fitted for CPAP unit he will be encouraged to bring it with him the day of the procedure.     PCP: Duncan Dullullo, Teresa This has been scribed by Sinda Duaryl-Lyn M Kennedy LPN    Earline MayotteByrnett, Khalidah Herbold W 01/30/2016, 4:56 PM

## 2016-01-29 NOTE — Patient Instructions (Signed)
Colonoscopy A colonoscopy is an exam to look at the entire large intestine (colon). This exam can help find problems such as tumors, polyps, inflammation, and areas of bleeding. The exam takes about 1 hour.  LET YOUR HEALTH CARE PROVIDER KNOW ABOUT:   Any allergies you have.  All medicines you are taking, including vitamins, herbs, eye drops, creams, and over-the-counter medicines.  Previous problems you or members of your family have had with the use of anesthetics.  Any blood disorders you have.  Previous surgeries you have had.  Medical conditions you have. RISKS AND COMPLICATIONS  Generally, this is a safe procedure. However, as with any procedure, complications can occur. Possible complications include:  Bleeding.  Tearing or rupture of the colon wall.  Reaction to medicines given during the exam.  Infection (rare). BEFORE THE PROCEDURE   Ask your health care provider about changing or stopping your regular medicines.  You may be prescribed an oral bowel prep. This involves drinking a large amount of medicated liquid, starting the day before your procedure. The liquid will cause you to have multiple loose stools until your stool is almost clear or light green. This cleans out your colon in preparation for the procedure.  Do not eat or drink anything else once you have started the bowel prep, unless your health care provider tells you it is safe to do so.  Arrange for someone to drive you home after the procedure. PROCEDURE   You will be given medicine to help you relax (sedative).  You will lie on your side with your knees bent.  A long, flexible tube with a light and camera on the end (colonoscope) will be inserted through the rectum and into the colon. The camera sends video back to a computer screen as it moves through the colon. The colonoscope also releases carbon dioxide gas to inflate the colon. This helps your health care provider see the area better.  During  the exam, your health care provider may take a small tissue sample (biopsy) to be examined under a microscope if any abnormalities are found.  The exam is finished when the entire colon has been viewed. AFTER THE PROCEDURE   Do not drive for 24 hours after the exam.  You may have a small amount of blood in your stool.  You may pass moderate amounts of gas and have mild abdominal cramping or bloating. This is caused by the gas used to inflate your colon during the exam.  Ask when your test results will be ready and how you will get your results. Make sure you get your test results.   This information is not intended to replace advice given to you by your health care provider. Make sure you discuss any questions you have with your health care provider.   Document Released: 07/19/2000 Document Revised: 05/12/2013 Document Reviewed: 03/29/2013 Elsevier Interactive Patient Education 2016 [employer]  

## 2016-01-30 ENCOUNTER — Other Ambulatory Visit: Payer: Self-pay | Admitting: Internal Medicine

## 2016-01-30 DIAGNOSIS — Z0000 Encounter for general adult medical examination without abnormal findings: Secondary | ICD-10-CM | POA: Insufficient documentation

## 2016-01-30 NOTE — H&P (Signed)
HPI  Joshua Arellano is a 50 y.o. male here for a Colonoscopy discussion. He has not had one done prior. Moves his bowels regularly, usually twice daily. He does occasionally experience acid reflux, which he takes Tums. He states he may have IBS, he does have occasional diarrhea (Loose stools without mucus or blood) a couple times a week.  I personally reviewed the patient's history.  HPI  Past Medical History   Diagnosis  Date   .  Diabetes mellitus    .  Hyperlipidemia    .  Hypertension    .  Obesity (BMI 30-39.9)    .  Hemorrhoid    .  Knee pain, left      episodic   .  Hemorrhoids     Past Surgical History   Procedure  Laterality  Date   .  Tonsillectomy   1972   .  Anoscopy   10 years ago     Hemorrhoids    Family History   Problem  Relation  Age of Onset   .  Cancer  Maternal Uncle    .  Stroke  Father    .  Heart disease  Maternal Grandfather    .  Hypertension  Maternal Grandfather    .  Hypertension  Paternal Grandmother    .  Hypertension  Paternal Grandfather     Social History  Social History   Substance Use Topics   .  Smoking status:  Never Smoker   .  Smokeless tobacco:  Never Used   .  Alcohol Use:  Yes      Comment: rare    Allergies   Allergen  Reactions   .  Lisinopril  Cough     cough    Current Outpatient Prescriptions   Medication  Sig  Dispense  Refill   .  aspirin 81 MG chewable tablet  CHEW AND SWALLOW 1 TABLET BY MOUTH EVERY DAY  90 tablet  3   .  atorvastatin (LIPITOR) 20 MG tablet  TAKE 1 TABLET BY MOUTH EVERY DAY  90 tablet  2   .  BAYER CONTOUR TEST test strip  USE AS DIRECTED  150 each  1   .  glipiZIDE (GLUCOTROL) 5 MG tablet  Take 0.5 tablets (2.5 mg total) by mouth daily before breakfast.  60 tablet  3   .  hydrochlorothiazide (HYDRODIURIL) 25 MG tablet  Take 1 tablet (25 mg total) by mouth daily.  90 tablet  3   .  JARDIANCE 25 MG TABS tablet  Take 1 tablet by mouth daily.   3   .  metFORMIN (GLUCOPHAGE-XR) 500 MG 24 hr  tablet  Take 2,000 mg by mouth daily.   4   .  Multiple Vitamins-Minerals (MULTIVITAMIN WITH MINERALS) tablet  Take 1 tablet by mouth daily.     .  valsartan (DIOVAN) 80 MG tablet  Take 80 mg by mouth daily.     Marland Kitchen.  VICTOZA 18 MG/3ML SOPN  Inject 1.8 mg into the skin daily.   1   .  amLODipine (NORVASC) 10 MG tablet  TAKE 1 TABLET(10 MG) BY MOUTH DAILY  90 tablet  3   .  polyethylene glycol powder (GLYCOLAX/MIRALAX) powder  255 grams one bottle for colonoscopy prep  255 g  0    No current facility-administered medications for this visit.    Review of Systems  Review of Systems  Constitutional: Negative.  Respiratory: Negative.  Cardiovascular:  Negative.  Gastrointestinal: Positive for diarrhea (depends on which foods).   Blood pressure 132/74, pulse 86, resp. rate 14, height 5\' 11"  (1.803 m), weight 224 lb (101.606 kg).  Physical Exam  Physical Exam  Constitutional: He is oriented to person, place, and time. He appears well-developed and well-nourished.  Eyes: Conjunctivae are normal. No scleral icterus.  Neck: Neck supple.  Cardiovascular: Normal rate, regular rhythm and normal heart sounds.  Pulmonary/Chest: Effort normal and breath sounds normal.  Lymphadenopathy:  He has no cervical adenopathy.  Neurological: He is alert and oriented to person, place, and time.  Skin: Skin is warm and dry.  Psychiatric: He has a normal mood and affect.   Data Reviewed  PCP note of April 2017 reviewed. PCP notes report the patient fitted for CPAP unit.  2017 laboratory studies completed by PCP reviewed. Normal CBC.  Assessment   Added for screening colonoscopy.   Plan   [facility] This patient will not change dose of Victoza or Jardiance but will hold glucotrol and decrease Metformin by half day or prep and procedure. Patient will only take blood pressure pill the day of colonoscopy at 6 am with a small sip of water. It is okay for patient  to continue 81 mg aspirin once daily.  If the patient has been fitted for CPAP unit he will be encouraged to bring it with him the day of the procedure.   PCP: Duncan Dullullo, Teresa  This has been scribed by Sinda Duaryl-Lyn M Kennedy LPN  Earline MayotteByrnett, Japleen Tornow W  01/30/2016, 4:56 PM

## 2016-02-01 ENCOUNTER — Telehealth: Payer: Self-pay

## 2016-02-01 NOTE — Telephone Encounter (Signed)
Spoke with patient and he does use a C-PAP machine at home. He will be sure to bring this with him the day of his colonoscopy.

## 2016-02-01 NOTE — Telephone Encounter (Signed)
-----   Message from Earline MayotteJeffrey W Byrnett, MD sent at 01/30/2016  5:01 PM EDT ----- Please contact the patient and notify him that if he has been fitted for CPAP unit he should bring it with him the day of his colonoscopy. Thank you

## 2016-02-09 ENCOUNTER — Encounter: Payer: Self-pay | Admitting: Internal Medicine

## 2016-02-13 LAB — HM DIABETES EYE EXAM

## 2016-02-19 ENCOUNTER — Encounter: Payer: Self-pay | Admitting: Internal Medicine

## 2016-02-23 ENCOUNTER — Telehealth: Payer: Self-pay

## 2016-02-23 ENCOUNTER — Encounter: Payer: Self-pay | Admitting: Internal Medicine

## 2016-02-23 NOTE — Telephone Encounter (Signed)
Call to patient to see if any of his medications have changed since seeing us on 01/29/16. He reports that he has been placed back on his Lipitor 20 mg daily. This is the only medication change, no changes needed to Colonoscopy prep instructions. Patient is aware of date and following instructions. This patient will not change dose of Victoza or Jardiance but will hold glucotrol and decrease Metformin by half day or prep and procedure. Patient will only take blood pressure pill the day of colonoscopy at 6 am with a small sip of water. It is okay for patient to continue 81 mg aspirin once daily. He will bring his C-PAP machine with him the day of procedure.

## 2016-02-27 ENCOUNTER — Encounter: Payer: Self-pay | Admitting: *Deleted

## 2016-02-28 ENCOUNTER — Ambulatory Visit: Payer: BC Managed Care – PPO | Admitting: Anesthesiology

## 2016-02-28 ENCOUNTER — Encounter: Payer: Self-pay | Admitting: Anesthesiology

## 2016-02-28 ENCOUNTER — Encounter
Admission: RE | Disposition: A | Discharge status: Home / Self Care | Payer: Self-pay | Source: Ambulatory Visit | Attending: General Surgery

## 2016-02-28 ENCOUNTER — Ambulatory Visit
Admission: RE | Admit: 2016-02-28 | Discharge: 2016-02-28 | Disposition: A | Discharge status: Home / Self Care | Payer: BC Managed Care – PPO | Source: Ambulatory Visit | Attending: General Surgery | Admitting: General Surgery

## 2016-02-28 DIAGNOSIS — E669 Obesity, unspecified: Secondary | ICD-10-CM | POA: Insufficient documentation

## 2016-02-28 DIAGNOSIS — I10 Essential (primary) hypertension: Secondary | ICD-10-CM | POA: Diagnosis not present

## 2016-02-28 DIAGNOSIS — G4730 Sleep apnea, unspecified: Secondary | ICD-10-CM | POA: Diagnosis not present

## 2016-02-28 DIAGNOSIS — E119 Type 2 diabetes mellitus without complications: Secondary | ICD-10-CM | POA: Insufficient documentation

## 2016-02-28 DIAGNOSIS — E785 Hyperlipidemia, unspecified: Secondary | ICD-10-CM | POA: Diagnosis not present

## 2016-02-28 DIAGNOSIS — F329 Major depressive disorder, single episode, unspecified: Secondary | ICD-10-CM | POA: Diagnosis not present

## 2016-02-28 DIAGNOSIS — Z91040 Latex allergy status: Secondary | ICD-10-CM | POA: Diagnosis not present

## 2016-02-28 DIAGNOSIS — Z7982 Long term (current) use of aspirin: Secondary | ICD-10-CM | POA: Diagnosis not present

## 2016-02-28 DIAGNOSIS — Z6830 Body mass index (BMI) 30.0-30.9, adult: Secondary | ICD-10-CM | POA: Insufficient documentation

## 2016-02-28 DIAGNOSIS — Z1211 Encounter for screening for malignant neoplasm of colon: Secondary | ICD-10-CM | POA: Diagnosis not present

## 2016-02-28 DIAGNOSIS — Z79899 Other long term (current) drug therapy: Secondary | ICD-10-CM | POA: Diagnosis not present

## 2016-02-28 HISTORY — PX: COLONOSCOPY WITH PROPOFOL: SHX5780

## 2016-02-28 LAB — GLUCOSE, CAPILLARY: GLUCOSE-CAPILLARY: 153 mg/dL — AB (ref 65–99)

## 2016-02-28 SURGERY — COLONOSCOPY WITH PROPOFOL
Anesthesia: General

## 2016-02-28 MED ORDER — SODIUM CHLORIDE 0.9 % IV SOLN
INTRAVENOUS | Status: DC
  Administered 2016-02-28: 1000 mL via INTRAVENOUS

## 2016-02-28 MED ORDER — MIDAZOLAM HCL 2 MG/2ML IJ SOLN
INTRAMUSCULAR | Status: DC | PRN
Start: 2016-02-28 — End: 2016-02-28
  Administered 2016-02-28: 1 mg via INTRAVENOUS

## 2016-02-28 MED ORDER — PROPOFOL 500 MG/50ML IV EMUL
INTRAVENOUS | Status: DC | PRN
  Administered 2016-02-28: 120 ug/kg/min via INTRAVENOUS

## 2016-02-28 MED ORDER — FENTANYL CITRATE (PF) 100 MCG/2ML IJ SOLN
INTRAMUSCULAR | Status: DC | PRN
  Administered 2016-02-28: 50 ug via INTRAVENOUS

## 2016-02-28 MED ORDER — EPHEDRINE SULFATE 50 MG/ML IJ SOLN
INTRAMUSCULAR | Status: DC | PRN
  Administered 2016-02-28 (×2): 5 mg via INTRAVENOUS

## 2016-02-28 NOTE — H&P (Signed)
QAYS MEINDERS [id number] 15-May-1966     HPI: Healthy 50 y/o for screening colonoscopy. Tolerated prep well.   Prescriptions Prior to Admission  Medication Sig Dispense Refill Last Dose  . amLODipine (NORVASC) 10 MG tablet TAKE 1 TABLET(10 MG) BY MOUTH DAILY 90 tablet 3   . aspirin 81 MG chewable tablet CHEW AND SWALLOW 1 TABLET BY MOUTH EVERY DAY 90 tablet 3 Taking  . atorvastatin (LIPITOR) 20 MG tablet TAKE 1 TABLET BY MOUTH EVERY DAY 90 tablet 2 Taking  . BAYER CONTOUR TEST test strip USE AS DIRECTED 150 each 1 Taking  . glipiZIDE (GLUCOTROL) 5 MG tablet Take 0.5 tablets (2.5 mg total) by mouth daily before breakfast. 60 tablet 3 Taking  . hydrochlorothiazide (HYDRODIURIL) 25 MG tablet Take 1 tablet (25 mg total) by mouth daily. 90 tablet 3 Taking  . JARDIANCE 25 MG TABS tablet Take 1 tablet by mouth daily.  3 Taking  . metFORMIN (GLUCOPHAGE-XR) 500 MG 24 hr tablet Take 2,000 mg by mouth daily.  4 Taking  . Multiple Vitamins-Minerals (MULTIVITAMIN WITH MINERALS) tablet Take 1 tablet by mouth daily.   Taking  . polyethylene glycol powder (GLYCOLAX/MIRALAX) powder 255 grams one bottle for colonoscopy prep 255 g 0   . valsartan (DIOVAN) 80 MG tablet Take 80 mg by mouth daily.   Taking  . VICTOZA 18 MG/3ML SOPN Inject 1.8 mg into the skin daily.  1 Taking   Allergies  Allergen Reactions  . Latex Itching  . Lisinopril Cough    cough   Past Medical History:  Diagnosis Date  . Diabetes mellitus   . Hemorrhoid   . Hemorrhoids   . Hyperlipidemia   . Hypertension   . Knee pain, left    episodic  . Obesity (BMI 30-39.9)   . Sleep apnea    Uses C-PAP machine   Past Surgical History:  Procedure Laterality Date  . ANOSCOPY  10 years ago   Hemorrhoids  . TONSILLECTOMY  1972   Social History   Social History  . Marital status: Married    Spouse name: N/A  . Number of children: N/A  . Years of education: N/A   Occupational History  . Not on file.   Social History Main  Topics  . Smoking status: Never Smoker  . Smokeless tobacco: Never Used  . Alcohol use Yes     Comment: rare  . Drug use: No  . Sexual activity: Not on file   Other Topics Concern  . Not on file   Social History Narrative  . No narrative on file   Social History   Social History Narrative  . No narrative on file     ROS: Negative.     PE: HEENT: Negative. Lungs: Clear. Cardio: RR. Assessment/Plan:  Proceed with planned endoscopy.  Earline Mayotte 02/28/2016

## 2016-02-28 NOTE — Anesthesia Procedure Notes (Signed)
Performed by: COOK-MARTIN, Josanne Boerema Pre-anesthesia Checklist: Patient identified, Emergency Drugs available, Suction available, Patient being monitored and Timeout performed Patient Re-evaluated:Patient Re-evaluated prior to inductionOxygen Delivery Method: Nasal cannula Preoxygenation: Pre-oxygenation with 100% oxygen Intubation Type: IV induction Placement Confirmation: positive ETCO2 and CO2 detector       

## 2016-02-28 NOTE — Anesthesia Preprocedure Evaluation (Signed)
Anesthesia Evaluation  Patient identified by MRN, date of birth, ID band Patient awake    Reviewed: Allergy & Precautions, NPO status , Patient's Chart, lab work & pertinent test results  History of Anesthesia Complications Negative for: history of anesthetic complications  Airway Mallampati: II  TM Distance: >3 FB Neck ROM: Full    Dental no notable dental hx.    Pulmonary sleep apnea and Continuous Positive Airway Pressure Ventilation , neg COPD,    breath sounds clear to auscultation- rhonchi (-) wheezing      Cardiovascular Exercise Tolerance: Good hypertension, Pt. on medications (-) CAD, (-) Past MI and (-) CHF  Rhythm:Regular Rate:Normal - Systolic murmurs and - Diastolic murmurs    Neuro/Psych Depression    GI/Hepatic negative GI ROS, Neg liver ROS,   Endo/Other  diabetes, Type 2  Renal/GU negative Renal ROS     Musculoskeletal negative musculoskeletal ROS (+)   Abdominal (+) + obese,   Peds  Hematology negative hematology ROS (+)   Anesthesia Other Findings Past Medical History: No date: Diabetes mellitus No date: Hemorrhoid No date: Hemorrhoids No date: Hyperlipidemia No date: Hypertension No date: Knee pain, left     Comment: episodic No date: Obesity (BMI 30-39.9) No date: Sleep apnea     Comment: Uses C-PAP machine   Reproductive/Obstetrics                             Anesthesia Physical Anesthesia Plan  ASA: II  Anesthesia Plan: General   Post-op Pain Management:    Induction: Intravenous  Airway Management Planned: Natural Airway  Additional Equipment:   Intra-op Plan:   Post-operative Plan:   Informed Consent: I have reviewed the patients History and Physical, chart, labs and discussed the procedure including the risks, benefits and alternatives for the proposed anesthesia with the patient or authorized representative who has indicated his/her  understanding and acceptance.     Plan Discussed with:   Anesthesia Plan Comments:         Anesthesia Quick Evaluation

## 2016-02-28 NOTE — Anesthesia Postprocedure Evaluation (Signed)
Anesthesia Post Note  Patient: Joshua Arellano  Procedure(s) Performed: Procedure(s) (LRB): COLONOSCOPY WITH PROPOFOL (N/A)  Patient location during evaluation: PACU Anesthesia Type: General Level of consciousness: awake and alert and oriented Pain management: pain level controlled Vital Signs Assessment: post-procedure vital signs reviewed and stable Respiratory status: spontaneous breathing, nonlabored ventilation and respiratory function stable Cardiovascular status: blood pressure returned to baseline and stable Postop Assessment: no signs of nausea or vomiting Anesthetic complications: no    Last Vitals:  Vitals:   02/28/16 0725 02/28/16 0834  BP: 124/84 (!) 100/51  Pulse: 86 77  Resp: 17 10  Temp: 36.8 C 36.2 C    Last Pain:  Vitals:   02/28/16 0834  TempSrc: Tympanic                 [name]

## 2016-02-28 NOTE — Op Note (Addendum)
[facility] Patient Name: Joshua Arellano Procedure Date: 02/28/2016 8:08 AM MRN: [id number] Account #: [id number] Date of Birth: May 29, 1966 Admit Type: Outpatient Age: 50 Room: [facility] ENDO ROOM 1 Gender: Male Note Status: Supervisor Override Procedure:            Colonoscopy Indications:          Screening for colorectal malignant neoplasm Providers:            Earline Mayotte, MD Referring MD:         Duncan Dull, MD (Referring MD) Medicines:            Monitored Anesthesia Care Complications:        No immediate complications. Procedure:            Pre-Anesthesia Assessment:                       - Prior to the procedure, a History and Physical was                        performed, and patient medications, allergies and                        sensitivities were reviewed. The patient's tolerance of                        previous anesthesia was reviewed.                       - The risks and benefits of the procedure and the                        sedation options and risks were discussed with the                        patient. All questions were answered and informed                        consent was obtained.                       After obtaining informed consent, the colonoscope was                        passed under direct vision. Throughout the procedure,                        the patient's blood pressure, pulse, and oxygen                        saturations were monitored continuously. The                        Colonoscope was introduced through the anus and                        advanced to the the terminal ileum. The colonoscopy was                        performed with ease. The patient tolerated the  procedure well. The quality of the bowel preparation                        was excellent. Findings:      The entire examined colon appeared normal on direct and retroflexion       views. Impression:            - The entire examined colon is normal on direct and                        retroflexion views.                       - No specimens collected. Recommendation:       - Repeat colonoscopy in 10 years for screening purposes. Procedure Code(s):    --- Professional ---                       [phone], Colonoscopy, flexible; diagnostic, including                        collection of specimen(s) by brushing or washing, when                        performed (separate procedure) Diagnosis Code(s):    --- Professional ---                       Z12.11, Encounter for screening for malignant neoplasm                        of colon CPT copyright 2016 American Medical Association. All rights reserved. The codes documented in this report are preliminary and upon coder review may  be revised to meet current compliance requirements. Earline Mayotte, MD 02/28/2016 8:31:59 AM This report has been signed electronically. Number of Addenda: 0 Note Initiated On: 02/28/2016 8:08 AM Scope Withdrawal Time: 0 hours 13 minutes 7 seconds  Total Procedure Duration: 0 hours 17 minutes 15 seconds       [facility]

## 2016-02-28 NOTE — Transfer of Care (Signed)
Immediate Anesthesia Transfer of Care Note  Patient: Joshua Arellano  Procedure(s) Performed: Procedure(s): COLONOSCOPY WITH PROPOFOL (N/A)  Patient Location: PACU  Anesthesia Type:General  Level of Consciousness: awake and sedated  Airway & Oxygen Therapy: Patient Spontanous Breathing and Patient connected to nasal cannula oxygen  Post-op Assessment: Report given to RN and Post -op Vital signs reviewed and stable  Post vital signs: Reviewed and stable  Last Vitals:  Vitals:   02/28/16 0725  BP: 124/84  Pulse: 86  Resp: 17  Temp: 36.8 C    Last Pain:  Vitals:   02/28/16 0725  TempSrc: Tympanic         Complications: No apparent anesthesia complications

## 2016-02-29 ENCOUNTER — Encounter: Payer: Self-pay | Admitting: General Surgery

## 2016-03-08 ENCOUNTER — Telehealth: Payer: Self-pay | Admitting: *Deleted

## 2016-03-08 NOTE — Telephone Encounter (Signed)
Called pt, Pt's wife answered saying that they were at the walk in clinic now checking in to have his foot evaluated.

## 2016-03-08 NOTE — Telephone Encounter (Signed)
Patient called and states that he seen Dr. Darrick Huntsman In May for pain in  rt foot.  Patient states that since then the pain in his Rt foot as gotten worst. Today he noticed it  was swollen and it is still painful.  Patient has an appt to see Dr. Darrick Huntsman on Monday. 03/11/16. Patient is requesting a call back. His number [phone].

## 2016-03-18 ENCOUNTER — Ambulatory Visit (INDEPENDENT_AMBULATORY_CARE_PROVIDER_SITE_OTHER): Payer: BC Managed Care – PPO | Admitting: Internal Medicine

## 2016-03-18 VITALS — BP 134/72 | HR 84 | Temp 98.1°F | Resp 12 | Wt 231.0 lb

## 2016-03-18 DIAGNOSIS — B369 Superficial mycosis, unspecified: Secondary | ICD-10-CM

## 2016-03-18 DIAGNOSIS — H6240 Otitis externa in other diseases classified elsewhere, unspecified ear: Principal | ICD-10-CM

## 2016-03-18 DIAGNOSIS — H60392 Other infective otitis externa, left ear: Secondary | ICD-10-CM | POA: Diagnosis not present

## 2016-03-18 NOTE — Patient Instructions (Addendum)
I am referring you ot DR Junegel bc you appear to have a fungal infectio n in  your right ear and cerumen impaction in the left   I think your daytime fatigue is due to your hi carb diet. I recommend a total dietary shift to a low carb diet for a month, and we'll check your A1c when you return     7 AM Breakfast:  Choose from the following:  Low carbohydrate Protein  Shakes (I recommend the  Premier Protein chocolate shakes,  EAS AdvantEdge "Carb Control" shakes  Or the Atkins shakes all are under 3 net carbs)     j Jimmy De[employer] sells microwaveable frittata (basically a quiche without the pastry crust) that is eaten cold and very convenient way to get your eggs.  8 carbs)     Avoid cereal and bananas, oatmeal biscuits and cream of wheat and grits. They are loaded with carbohydrates!   10 AM: high protein snack:   KIND low GI bar  (available at [place] [employer])   A stick of cheese:  Around 1 carb,  100 cal     Dannon Light n Fit Austria[place] Yogurt  (80 cal, 8 carbs)   Other so called "protein bars" and Greek yogurts tend to be loaded with carbohydrates.  Remember, in food advertising, the word "energy" is synonymous for " carbohydrate."  Lunch:   A Sandwich using the bread choices listed, Can use any  Eggs,  lunchmeat, grilled meat or canned tuna), avocado, regular mayo/mustard  and cheese.  A Salad using blue cheese, ranch,  Goddess or vinagrette,  Avoid taco shells, croutons or "confetti" and no "candied nuts" but regular nuts OK.   No pretzels, nabs  or chips.  Pickles and miniature sweet peppers are a good low carb alternative that provide a "crunch"  The bread is the only source of carbohydrate in a sandwich and  can be decreased by trying some of the attached alternatives to traditional loaf bread   Avoid "Low fat dressings, as well as Reyne DumasCatalina and [place] [employer] dressings They are loaded with sugar!   3 PM/ Mid day  Snack:  Consider  1 ounce of  almonds, walnuts, pistachios, pecans, peanuts,   Macadamia nuts or a nut medley.  Avoid "granola and granola bars "  Mixed nuts are ok in moderation as long as there are no raisins,  cranberries or dried fruit.   KIND bars are OK if you get the low glycemic index variety   Try the prosciutto/mozzarella cheese sticks by [employer]  In deli /backery section   High protein      6 PM  Dinner:     Meat/fowl/fish with a green salad, and either broccoli, cauliflower, green beans, spinach, brussel sprouts or  Lima beans. DO NOT BREAD THE PROTEIN!!      There is a low carb pasta by Dreamfield's that is acceptable and tastes great: only 5 digestible carbs/serving.( All grocery stores but BJs carry it ) Several ready made meals are available low carb:   Try Michel Angelo's chicken piccata or chicken or eggplant parm over low carb pasta.(Lowes and BJs)   Green Giant makes a mashed cauliflower that tastes like mashed potatoes  Whole wheat pasta is still full of digestible carbs and  Not as low in glycemic index as Dreamfield's.   Brown rice is still rice,  So skip the rice and noodles if you eat Congohinese or [place] (or at least limit to 1/2 cup)  9  PM snack :   Breyer's "low carb" fudgsicle or  ice cream bar (Carb Smart line), or  Weight Watcher's ice cream bar , or another "no sugar added" ice cream  Halo Top and E Enlightenment delicious but $$$$$ ;  a serving of fresh berries/cherries with whipped cream   Cheese or DANNON'S LlGHT N FIT GREEK YOGURT (try the key lime and add reddi whip)   8 ounces of [place] unsweetened almond/cococunut milk    Treat yourself to a parfait made with whipped cream blueberiies, walnuts and vanilla greek yogurt  Avoid bananas, pineapple, grapes  and watermelon on a regular basis because they are high in sugar.  THINK OF THEM AS DESSERT  Remember that snack Substitutions should be less than 10 NET carbs per serving and meals < 20 carbs. Remember to subtract fiber grams to get the "net carbs."

## 2016-03-18 NOTE — Progress Notes (Signed)
Pre visit review using our clinic review tool, if applicable. No additional management support is needed unless otherwise documented below in the visit note. 

## 2016-03-18 NOTE — Progress Notes (Signed)
Subjective:  Patient ID: Joshua Arellano, male    DOB: November 23, 1965  Age: 50 y.o. MRN: [id number]  CC: The primary encounter diagnosis was Acute fungal otitis externa. A diagnosis of Otitis, externa, infective, left was also pertinent to this visit.  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  Had a monthly download /compliance report and he is using CPAP . He has been having some left ear fullness  Lab Results  Component Value Date   HGBA1C 7.5 (H) 12/27/2015    Still feels sleepy by mid morning  Managing with increased caffeine intake.  occurs both this summer and during the school year  Started exercising 2-3 weeks ago.  4-5 days per week,  No chest pain ,  Shortness of breath.    Diet reviewed,  Not restricting starches,  Eating starches at every meal including biscuits and potatoes.   Outpatient Medications Prior to Visit  Medication Sig Dispense Refill  . amLODipine (NORVASC) 10 MG tablet TAKE 1 TABLET(10 MG) BY MOUTH DAILY 90 tablet 3  . aspirin 81 MG chewable tablet CHEW AND SWALLOW 1 TABLET BY MOUTH EVERY DAY 90 tablet 3  . atorvastatin (LIPITOR) 20 MG tablet TAKE 1 TABLET BY MOUTH EVERY DAY 90 tablet 2  . BAYER CONTOUR TEST test strip USE AS DIRECTED 150 each 1  . glipiZIDE (GLUCOTROL) 5 MG tablet Take 0.5 tablets (2.5 mg total) by mouth daily before breakfast. 60 tablet 3  . hydrochlorothiazide (HYDRODIURIL) 25 MG tablet Take 1 tablet (25 mg total) by mouth daily. 90 tablet 3  . JARDIANCE 25 MG TABS tablet Take 1 tablet by mouth daily.  3  . metFORMIN (GLUCOPHAGE-XR) 500 MG 24 hr tablet Take 2,000 mg by mouth daily.  4  . Multiple Vitamins-Minerals (MULTIVITAMIN WITH MINERALS) tablet Take 1 tablet by mouth daily.    . valsartan (DIOVAN) 80 MG tablet Take 80 mg by mouth daily.     Marland Kitchen. VICTOZA 18 MG/3ML SOPN Inject 1.8 mg into the skin daily.  1   No facility-administered medications prior to visit.     Review of Systems;  Patient denies headache, fevers, malaise, unintentional weight loss, skin rash, eye pain, sinus congestion and sinus pain, sore throat, dysphagia,  hemoptysis , cough, dyspnea, wheezing, chest pain, palpitations, orthopnea, edema, abdominal pain, nausea, melena, diarrhea, constipation, flank pain, dysuria, hematuria, urinary  Frequency, nocturia, numbness, tingling, seizures,  Focal weakness, Loss of consciousness,  Tremor, insomnia, depression, anxiety, and suicidal ideation.      Objective:  BP 134/72   Pulse 84   Temp 98.1 F (36.7 C)   Resp 12   Wt 231 lb (104.8 kg)   BMI 32.22 kg/m   BP Readings from Last 3 Encounters:  03/18/16 134/72  02/28/16 (!) 100/51  01/29/16 132/74    Wt Readings from Last 3 Encounters:  03/18/16 231 lb (104.8 kg)  02/28/16 220 lb (99.8 kg)  01/29/16 224 lb (101.6 kg)    General appearance: alert, cooperative and appears stated age Ears: normal TM's and external ear canals both ears Throat: lips, mucosa, and tongue normal; teeth and gums normal Neck: no adenopathy, no carotid bruit, supple, symmetrical, trachea midline and thyroid not enlarged, symmetric, no tenderness/mass/nodules Back: symmetric, no curvature.  ROM normal. No CVA tenderness. Lungs: clear to auscultation bilaterally Heart: regular rate and rhythm, S1, S2 normal, no murmur, click, rub or gallop Abdomen: soft, non-tender; bowel sounds normal; no masses,  no organomegaly Pulses: 2+ and symmetric Skin: Skin color, texture, turgor normal. No rashes or lesions Lymph nodes: Cervical, supraclavicular, and axillary nodes normal.  Lab Results  Component Value Date   HGBA1C 7.5 (H) 12/27/2015   HGBA1C 7.5 (H) 08/29/2015   HGBA1C 7.6 (H) 02/09/2015    Lab Results  Component Value Date   CREATININE 0.84 12/27/2015   CREATININE 1.01  08/29/2015   CREATININE 0.92 02/09/2015    Lab Results  Component Value Date   WBC 10.3 08/29/2015   HGB 15.5 08/29/2015   HCT 46.5 08/29/2015   PLT 375.0 08/29/2015   GLUCOSE 90 12/27/2015   CHOL 132 02/09/2015   TRIG 104.0 02/09/2015   HDL 33.60 (L) 02/09/2015   LDLDIRECT 82 12/23/2014   LDLCALC 78 02/09/2015   ALT 19 12/27/2015   AST 17 12/27/2015   NA 139 12/27/2015   K 3.6 12/27/2015   CL 103 12/27/2015   CREATININE 0.84 12/27/2015   BUN 11 12/27/2015   CO2 31 12/27/2015   TSH 0.64 08/29/2015   PSA 0.45 02/09/2015   HGBA1C 7.5 (H) 12/27/2015   MICROALBUR <0.7 12/27/2015    No results found.  Assessment & Plan:   Problem List Items Addressed This Visit    Otitis, externa, infective    Ear appears to have a fungal infection ,.  Will call in neomycin otic suspension and follow up with ENT        Other Visit Diagnoses    Acute fungal otitis externa    -  Primary   Relevant Orders   Ambulatory referral to ENT     A total of 25 minutes of face to face time was spent with patient more than half of which was spent in counselling and coordination of care    I am having Mr. Ducharme start on neomycin-polymyxin-hydrocortisone. I am also having him maintain his multivitamin with minerals, BAYER CONTOUR TEST, VICTOZA, metFORMIN, atorvastatin, glipiZIDE, valsartan, hydrochlorothiazide, JARDIANCE, aspirin, amLODipine, and etodolac.  Meds ordered this encounter  Medications  . etodolac (LODINE) 500 MG tablet    Sig: Take 1 tablet by mouth 2 (two) times daily.  Marland Kitchen. neomycin-polymyxin-hydrocortisone (CORTISPORIN) otic solution    Sig: Place 3 drops into both ears 4 (four) times daily. For otitis externa    Dispense:  10 mL    Refill:  0    There are no discontinued medications.  Follow-up: Return for follow up diabetes, fatigue .   Sherlene ShamsULLO, Mccayla Shimada L, MD

## 2016-03-19 MED ORDER — NEOMYCIN-POLYMYXIN-HC 3.5-10000-1 OT SOLN: 3.0000 [drp] | Freq: Four times a day (QID) | OTIC | 0 refills | Status: DC

## 2016-03-19 NOTE — Assessment & Plan Note (Signed)
Ear appears to have a fungal infection ,.  Will call in neomycin otic suspension and follow up with ENT

## 2016-03-19 NOTE — Assessment & Plan Note (Signed)
Screening for anemia, thyroid dysfunction negative.  No chest pain with exertion to suggest CAD.  Diet and worsening obesity may be the issue.  Recommend following a low GI diet for a month, continue exercise.   Lab Results  Component Value Date   TSH 0.64 08/29/2015   Lab Results  Component Value Date   WBC 10.3 08/29/2015   HGB 15.5 08/29/2015   HCT 46.5 08/29/2015   MCV 86.7 08/29/2015   PLT 375.0 08/29/2015

## 2016-04-06 IMAGING — CT CT FEMUR *R* W/ CM
1 series · 15 of 32 positions shown, 19 images · IV contrast (agent unspecified)
Comparison: None.

CONTRAST:  150mL OMNIPAQUE IOHEXOL 350 MG/ML SOLN

CLINICAL DATA: Right leg pain and swelling. Negative lower
extremity ultrasound for deep venous thrombosis. Swelling despite 4
days of antibiotic.

EXAM:
CT OF THE LOWER RIGHT EXTREMITY WITH CONTRAST
TECHNIQUE: Multidetector CT imaging of the right lower extremity was performed
according to the standard protocol following intravenous contrast
administration.

[Series 2: routine rt leg with · axial · 0.65mm/px · z∈[+54,+1044]mm · 15 of 221 slices shown, 19 images]
[im 15/221  soft-tissue]
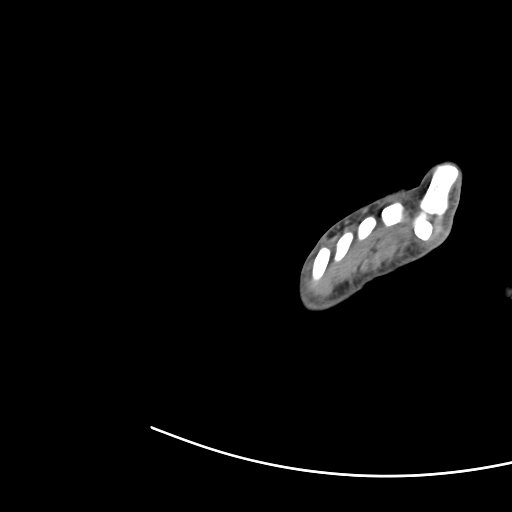
[im 15/221  bone]
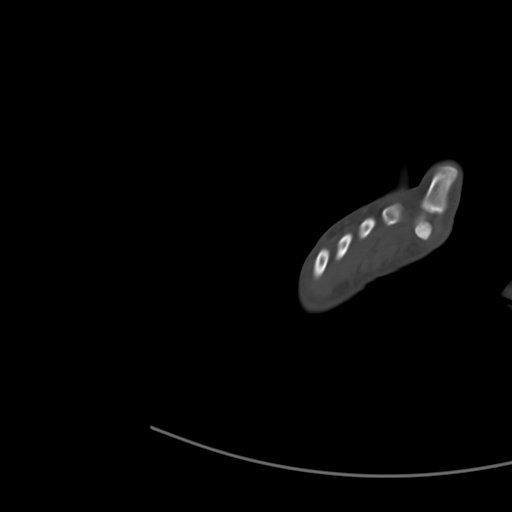
[im 29/221  soft-tissue]
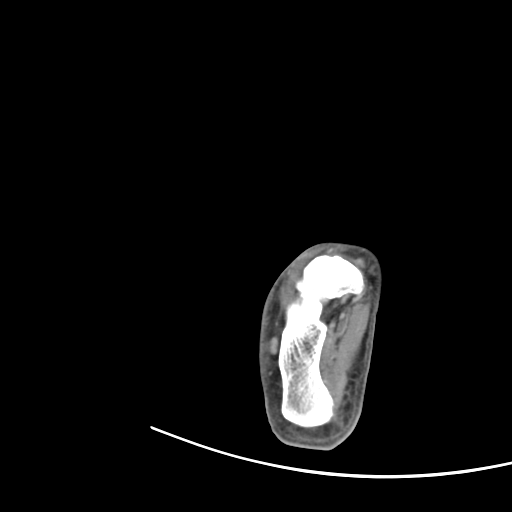
[im 43/221  soft-tissue]
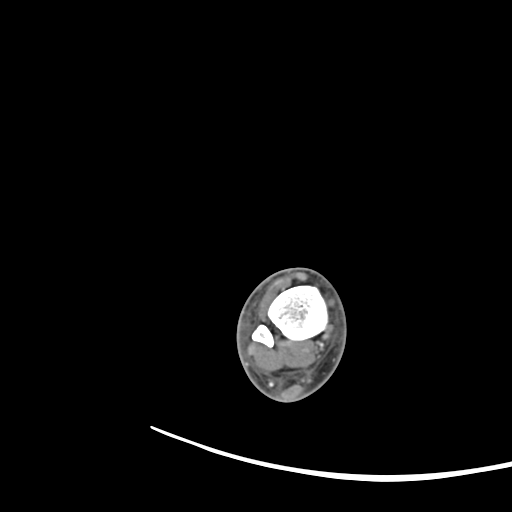
[im 64/221  soft-tissue]
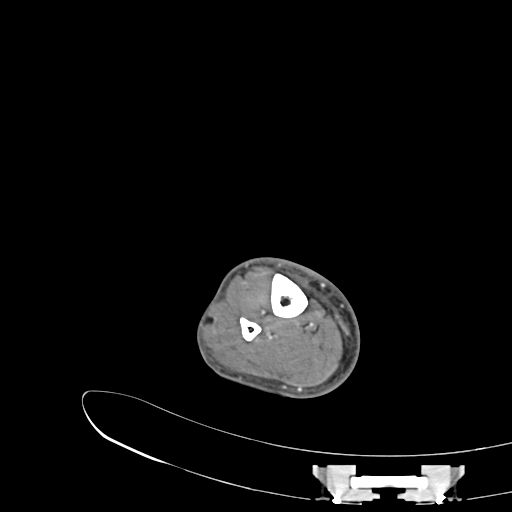
[im 79/221  soft-tissue]
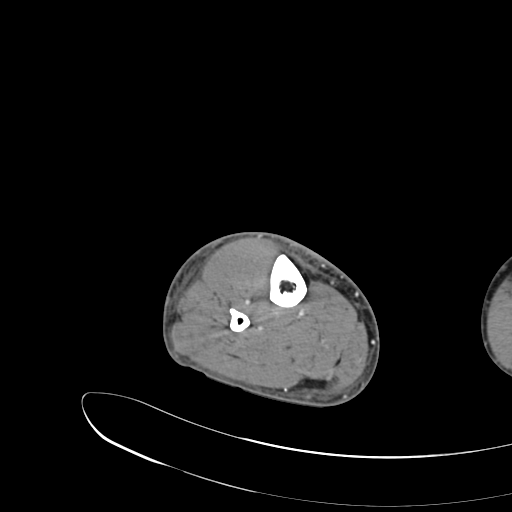
[im 93/221  soft-tissue]
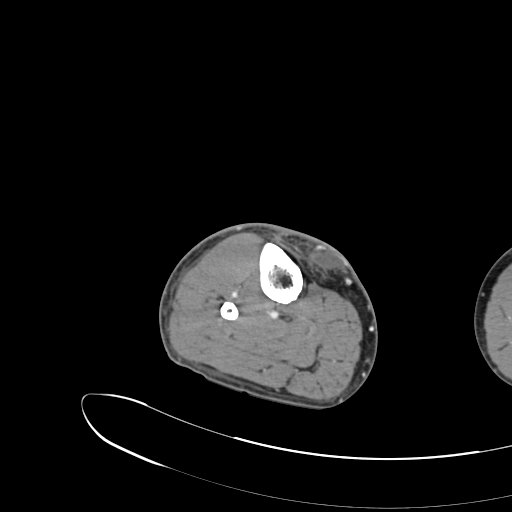
[im 114/221  soft-tissue]
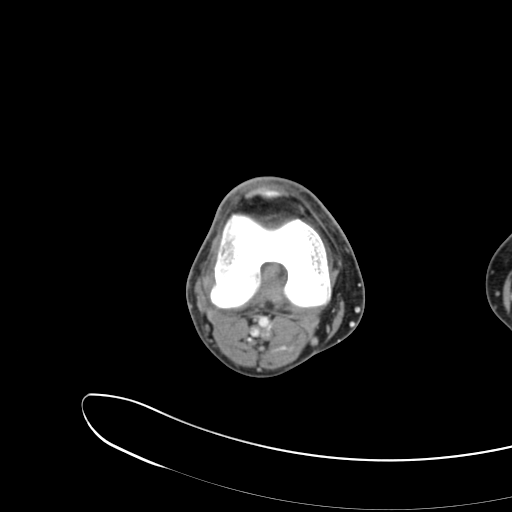
[im 128/221  soft-tissue]
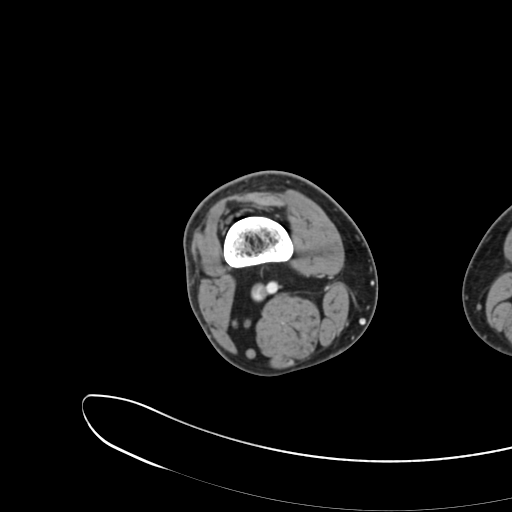
[im 142/221  soft-tissue]
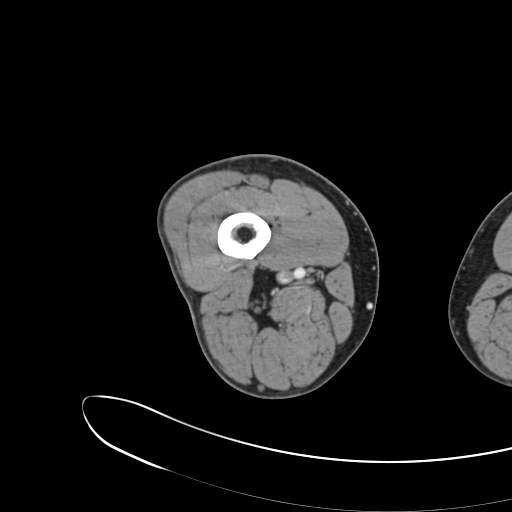
[im 142/221  bone]
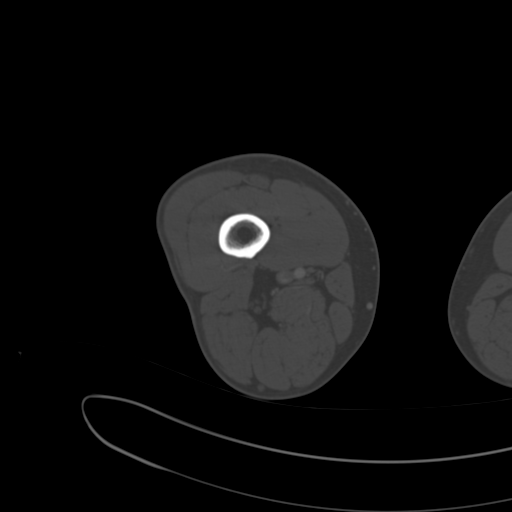
[im 157/221  soft-tissue]
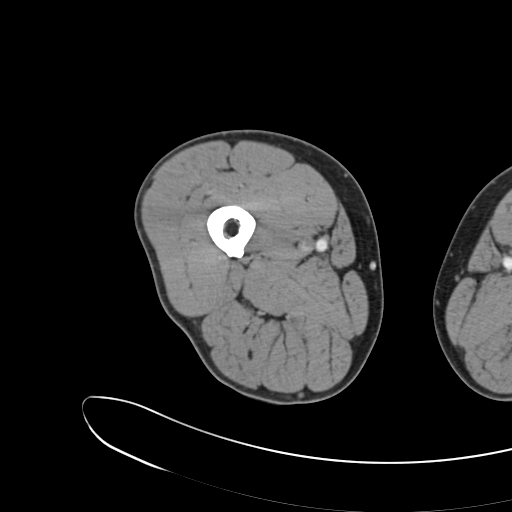
[im 178/221  soft-tissue]
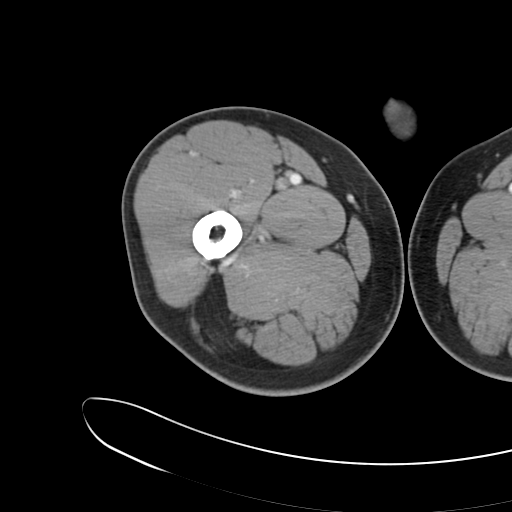
[im 192/221  soft-tissue]
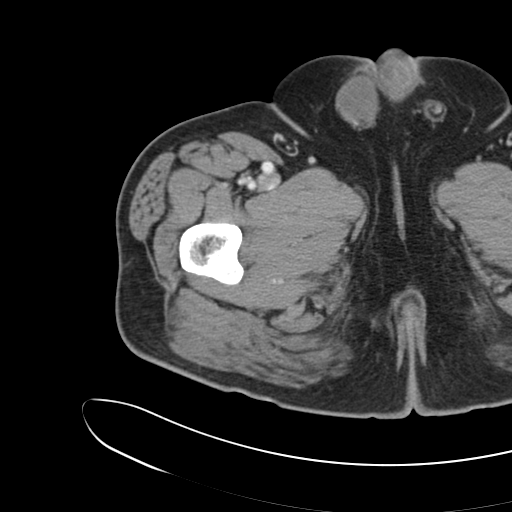
[im 192/221  lung]
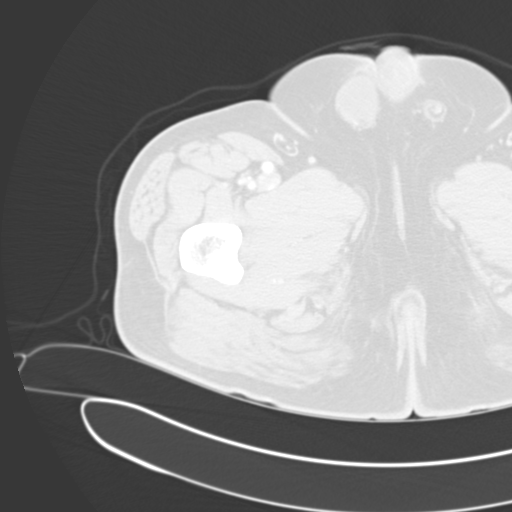
[im 199/221  lung]
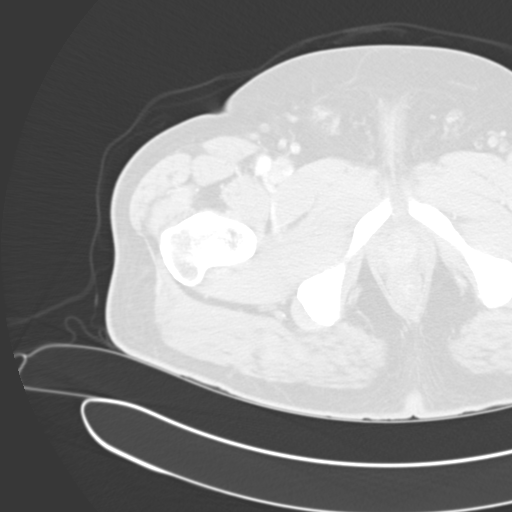
[im 206/221  soft-tissue]
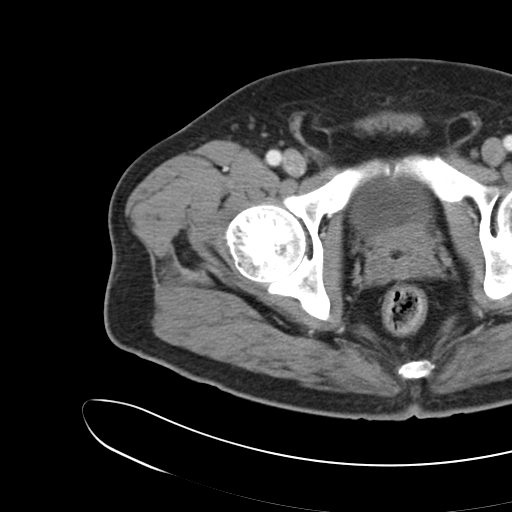
[im 206/221  lung]
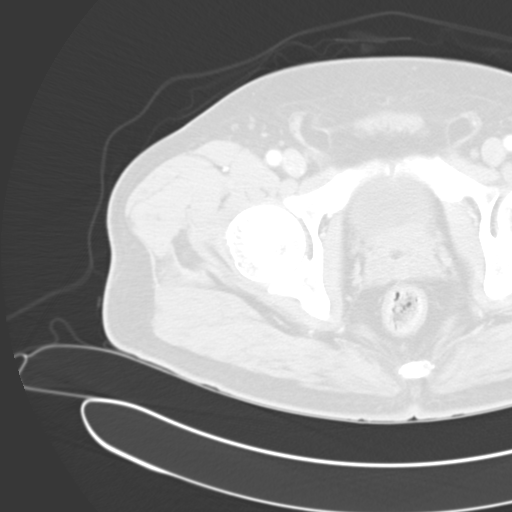
[im 213/221  lung]
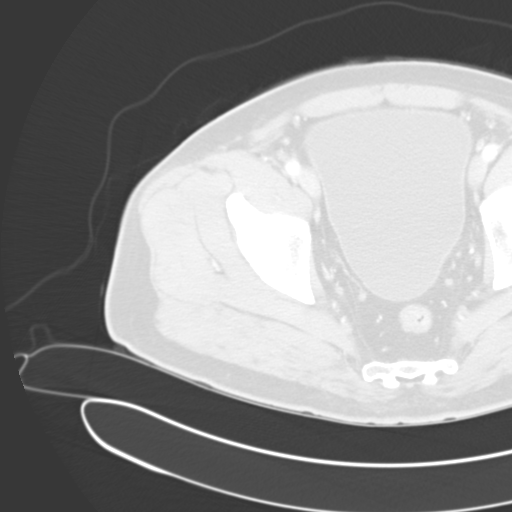

[15 of 32 positions shown; findings below may reference images not displayed]

FINDINGS: The hip demonstrates no fracture or dislocation. There is no lytic
or blastic lesion. The joint spaces are maintained.

The knee demonstrates no fracture or dislocation. There is no lytic
or blastic lesion. The joint spaces are maintained. There is no
joint effusion.

The ankle demonstrates no fracture or dislocation. There is no lytic
or blastic lesion. The ankle mortise is intact. The subtalar joints
are normal.

There is a small fat containing right inguinal hernia. The muscles
enhance normally and homogeneously.

There is circumferential soft tissue edema within the subcutaneous
fat of the right lower leg. Within the anterior medial subcutaneous
fat at the level of the tibial tubercle, there is a 3 x 2.7 x 6 cm
fluid collection with hazy inflammatory changes surrounding the
collection. There is no other fluid collection.

The neurovascular bundles are normal.
IMPRESSION: 1. Circumferential mild soft tissue edema in the right lower leg
within the subcutaneous fat most concerning for mild cellulitis. 3 x
2.7 x 6 cm ill-defined fluid collection in the anterior medial
subcutaneous fat just medial to the tibial tubercle concerning for a
small abscess or liquified hematoma.

## 2016-05-08 ENCOUNTER — Ambulatory Visit (INDEPENDENT_AMBULATORY_CARE_PROVIDER_SITE_OTHER): Payer: BC Managed Care – PPO | Admitting: Family Medicine

## 2016-05-08 VITALS — BP 128/78 | HR 99 | Temp 98.0°F | Wt 229.0 lb

## 2016-05-08 DIAGNOSIS — H1031 Unspecified acute conjunctivitis, right eye: Secondary | ICD-10-CM | POA: Diagnosis not present

## 2016-05-08 DIAGNOSIS — H109 Unspecified conjunctivitis: Secondary | ICD-10-CM | POA: Insufficient documentation

## 2016-05-08 DIAGNOSIS — Z23 Encounter for immunization: Secondary | ICD-10-CM | POA: Diagnosis not present

## 2016-05-08 MED ORDER — POLYMYXIN B-TRIMETHOPRIM 10000-0.1 UNIT/ML-% OP SOLN: OPHTHALMIC | 0 refills | Status: DC

## 2016-05-08 NOTE — Progress Notes (Signed)
Pre visit review using our clinic review tool, if applicable. No additional management support is needed unless otherwise documented below in the visit note. 

## 2016-05-08 NOTE — Assessment & Plan Note (Signed)
New problem. Treating empirically with Polytrim.

## 2016-05-08 NOTE — Patient Instructions (Signed)
Use the drop 4 times daily for 5 days.  Follow up as needed  Take care  Dr. Adriana Simasook

## 2016-05-08 NOTE — Progress Notes (Signed)
Subjective:  Patient ID: Joshua Arellano, male    DOB: May 02, 1966  Age: 50 y.o. MRN: [id number]  CC: ? Pink eye  HPI:  50 year old male presents for acute visit with complaints of possible conjunctivitis.  Patient states that over the weekend his wife noticed that his right eye was red. Last night he noticed the redness and also had associated burning and drainage. He states that his right eye was crusted chest this morning. Additionally, he states that the left eye is starting to get infected. No vision changes. No associated pain. No reports of photophobia. No other associated symptoms. No exacerbating or relieving factors. No other complaints at this time.  Social Hx   Social History   Social History  . Marital status: Married    Spouse name: N/A  . Number of children: N/A  . Years of education: N/A   Social History Main Topics  . Smoking status: Never Smoker  . Smokeless tobacco: Never Used  . Alcohol use Yes     Comment: rare  . Drug use: No  . Sexual activity: Not on file   Other Topics Concern  . Not on file   Social History Narrative  . No narrative on file   Review of Systems  Constitutional: Negative.   Eyes: Positive for discharge and redness.   Objective:  BP 128/78 (BP Location: Right Arm, Patient Position: Sitting, Cuff Size: Normal)   Pulse 99   Temp 98 F (36.7 C) (Oral)   Wt 229 lb (103.9 kg)   SpO2 96%   BMI 31.94 kg/m   BP/Weight 05/08/2016 03/18/2016 02/28/2016  Systolic BP 128 134 100  Diastolic BP 78 72 51  Wt. (Lbs) 229 231 220  BMI 31.94 32.22 30.68   Physical Exam  Constitutional: He is oriented to person, place, and time. He appears well-developed. No distress.  Eyes: Pupils are equal, round, and reactive to light.  Conjunctival injection noted of the right eye. No current discharge. Left eye appears normal.  Pulmonary/Chest: Effort normal.  Neurological: He is alert and oriented to person, place, and time.  Psychiatric: He has a  normal mood and affect.  Vitals reviewed.   Lab Results  Component Value Date   WBC 10.3 08/29/2015   HGB 15.5 08/29/2015   HCT 46.5 08/29/2015   PLT 375.0 08/29/2015   GLUCOSE 90 12/27/2015   CHOL 132 02/09/2015   TRIG 104.0 02/09/2015   HDL 33.60 (L) 02/09/2015   LDLDIRECT 82 12/23/2014   LDLCALC 78 02/09/2015   ALT 19 12/27/2015   AST 17 12/27/2015   NA 139 12/27/2015   K 3.6 12/27/2015   CL 103 12/27/2015   CREATININE 0.84 12/27/2015   BUN 11 12/27/2015   CO2 31 12/27/2015   TSH 0.64 08/29/2015   PSA 0.45 02/09/2015   HGBA1C 7.5 (H) 12/27/2015   MICROALBUR <0.7 12/27/2015    Assessment & Plan:   Problem List Items Addressed This Visit    Conjunctivitis - Primary    New problem. Treating empirically with Polytrim.       Other Visit Diagnoses    Encounter for immunization       Relevant Orders   Flu Vaccine QUAD 36+ mos IM (Completed)      Meds ordered this encounter  Medications  . trimethoprim-polymyxin b (POLYTRIM) ophthalmic solution    Sig: 1 drop to the effected eye(s) 4 times daily x 5 days.    Dispense:  10 mL    Refill:  0    Follow-up: PRN  Gantt

## 2016-05-24 ENCOUNTER — Encounter: Payer: Self-pay | Admitting: Internal Medicine

## 2016-05-24 ENCOUNTER — Emergency Department: Payer: BC Managed Care – PPO

## 2016-05-24 ENCOUNTER — Emergency Department
Admission: EM | Admit: 2016-05-24 | Discharge: 2016-05-24 | Disposition: A | Discharge status: Home / Self Care | Payer: BC Managed Care – PPO | Attending: Emergency Medicine | Admitting: Emergency Medicine

## 2016-05-24 ENCOUNTER — Encounter: Payer: Self-pay | Admitting: *Deleted

## 2016-05-24 DIAGNOSIS — Z7984 Long term (current) use of oral hypoglycemic drugs: Secondary | ICD-10-CM | POA: Insufficient documentation

## 2016-05-24 DIAGNOSIS — Z79899 Other long term (current) drug therapy: Secondary | ICD-10-CM | POA: Insufficient documentation

## 2016-05-24 DIAGNOSIS — R110 Nausea: Secondary | ICD-10-CM | POA: Diagnosis present

## 2016-05-24 DIAGNOSIS — R61 Generalized hyperhidrosis: Secondary | ICD-10-CM | POA: Diagnosis not present

## 2016-05-24 DIAGNOSIS — I10 Essential (primary) hypertension: Secondary | ICD-10-CM | POA: Insufficient documentation

## 2016-05-24 DIAGNOSIS — E119 Type 2 diabetes mellitus without complications: Secondary | ICD-10-CM | POA: Insufficient documentation

## 2016-05-24 DIAGNOSIS — Z7982 Long term (current) use of aspirin: Secondary | ICD-10-CM | POA: Insufficient documentation

## 2016-05-24 LAB — BASIC METABOLIC PANEL
Anion gap: 8 (ref 5–15)
BUN: 8 mg/dL (ref 6–20)
CALCIUM: 9.6 mg/dL (ref 8.9–10.3)
CO2: 27 mmol/L (ref 22–32)
CREATININE: 0.84 mg/dL (ref 0.61–1.24)
Chloride: 106 mmol/L (ref 101–111)
GFR calc Af Amer: >60 mL/min (ref >60)
GFR calc non Af Amer: >60 mL/min (ref >60)
GLUCOSE: 116 mg/dL — AB (ref 65–99)
Potassium: 3.7 mmol/L (ref 3.5–5.1)
Sodium: 141 mmol/L (ref 135–145)

## 2016-05-24 LAB — CBC
HCT: 46.8 % (ref 40.0–52.0)
Hemoglobin: 16.1 g/dL (ref 13.0–18.0)
MCH: 29.1 pg (ref 26.0–34.0)
MCHC: 34.4 g/dL (ref 32.0–36.0)
MCV: 84.6 fL (ref 80.0–100.0)
PLATELETS: 294 10*3/uL (ref 150–440)
RBC: 5.53 MIL/uL (ref 4.40–5.90)
RDW: 13.1 % (ref 11.5–14.5)
WBC: 9.3 10*3/uL (ref 3.8–10.6)

## 2016-05-24 LAB — TROPONIN I

## 2016-05-24 NOTE — ED Triage Notes (Signed)
Pt complains of nausea and sweating today, pt denies chest pain and any other symptoms

## 2016-05-24 NOTE — Discharge Instructions (Signed)
Please follow up with the recommended doctor as instructed above in these documents regarding today?s emergent visit and your recent symptoms to discuss further management.  Continue to take your regular medications. If you are not doing so already, please also take a daily baby aspirin (81 mg), at least until you follow up with your doctor.  Return to the Emergency Department (ED) if you experience any further chest pain/pressure/tightness, weakness in the face arm or leg, trouble speaking, difficulty breathing, or sudden sweating, or other symptoms that concern you.

## 2016-05-24 NOTE — ED Provider Notes (Signed)
[facility] Emergency Department Provider Note   ____________________________________________   First MD Initiated Contact with Patient 05/24/16 1017     (approximate)  I have reviewed the triage vital signs and the nursing notes.   HISTORY  Chief Complaint Nausea    HPI Joshua Arellano is a 50 y.o. male reports that about 8 this morning he felt sudden onset of nausea and sweating that lasted about 15 minutes.Patient was at work, when he suddenly began to feel nauseated when he was walking about. He felt sweaty, and his symptoms went away after about 20 minutes. He does report that he did not eat his normal breakfast this morning, and he took his diabetes medications.  Presently reports feeling improved. He is resting comfortably. He also reports he had an episode of the same thing happen about one month ago while at work after he hadn't had a normal breakfast  He did not check his blood sugar, reports when he has had low blood sugars in the past is not felt that way.  No numbness tingling weakness trouble walking or talking. He denies any chest pain shortness of breath or other symptoms.   Past Medical History:  Diagnosis Date  . Diabetes mellitus   . Hemorrhoid   . Hemorrhoids   . Hyperlipidemia   . Hypertension   . Knee pain, left    episodic  . Obesity (BMI 30-39.9)   . Sleep apnea    Uses C-PAP machine    Patient Active Problem List   Diagnosis Date Noted  . Conjunctivitis 05/08/2016  . Encounter for screening colonoscopy 01/30/2016  . Other fatigue 08/29/2015  . Chronic venous insufficiency 07/05/2015  . OSA (obstructive sleep apnea) 12/25/2014  . 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 Depression (emotion) 02/16/2013  . Allergic reaction to Augmentin 10/08/2012  . Routine general medical examination at a health care facility 04/30/2012  . Obesity (BMI 30-39.9) 07/11/2011  . Hemorrhoid   . Diabetes mellitus without  complication (HCC) 04/09/2011  . Dyslipidemia, goal LDL below 70 04/09/2011  . Hypertension 04/09/2011    Past Surgical History:  Procedure Laterality Date  . ANOSCOPY  10 years ago   Hemorrhoids  . COLONOSCOPY WITH PROPOFOL N/A 02/28/2016   Procedure: COLONOSCOPY WITH PROPOFOL;  Surgeon: Earline Mayotte, MD;  [facility] ENDOSCOPY;  Service: Endoscopy;  Laterality: N/A;  . TONSILLECTOMY  1972    Prior to Admission medications   Medication Sig Start Date End Date Taking? Authorizing Provider  amLODipine (NORVASC) 10 MG tablet TAKE 1 TABLET(10 MG) BY MOUTH DAILY 01/30/16   Sherlene Shams, MD  aspirin 81 MG chewable tablet CHEW AND SWALLOW 1 TABLET BY MOUTH EVERY DAY 11/13/15   Sherlene Shams, MD  atorvastatin (LIPITOR) 20 MG tablet TAKE 1 TABLET BY MOUTH EVERY DAY 12/23/14   Sherlene Shams, MD  BAYER CONTOUR TEST test strip USE AS DIRECTED    Sherlene Shams, MD  etodolac (LODINE) 500 MG tablet Take 1 tablet by mouth 2 (two) times daily. 03/08/16   Historical Provider, MD  glipiZIDE (GLUCOTROL) 5 MG tablet Take 0.5 tablets (2.5 mg total) by mouth daily before breakfast. 02/14/15   Sherlene Shams, MD  hydrochlorothiazide (HYDRODIURIL) 25 MG tablet Take 1 tablet (25 mg total) by mouth daily. 07/03/15   Sherlene Shams, MD  JARDIANCE 25 MG TABS tablet Take 1 tablet by mouth daily. 09/11/15   Historical Provider, MD  metFORMIN (GLUCOPHAGE-XR) 500 MG 24 hr  tablet Take 2,000 mg by mouth daily. 11/05/14   Historical Provider, MD  Multiple Vitamins-Minerals (MULTIVITAMIN WITH MINERALS) tablet Take 1 tablet by mouth daily.    Historical Provider, MD  trimethoprim-polymyxin b (POLYTRIM) ophthalmic solution 1 drop to the effected eye(s) 4 times daily x 5 days. 05/08/16   Jayce G Cook, DO  valsartan (DIOVAN) 80 MG tablet Take 80 mg by mouth daily. 05/17/15 05/16/16  Historical Provider, MD  VICTOZA 18 MG/3ML SOPN Inject 1.8 mg into the skin daily. 12/20/14   Historical Provider, MD    Allergies Latex and  Lisinopril  Family History  Problem Relation Age of Onset  . Stroke Father   . Cancer Maternal Uncle   . Heart disease Maternal Grandfather   . Hypertension Maternal Grandfather   . Hypertension Paternal Grandmother   . Hypertension Paternal Grandfather     Social History Social History  Substance Use Topics  . Smoking status: Never Smoker  . Smokeless tobacco: Never Used  . Alcohol use Yes     Comment: rare    Review of Systems Constitutional: No fever/chills Eyes: No visual changes. ENT: No sore throat. Cardiovascular: Denies chest pain. Respiratory: Denies shortness of breath. Gastrointestinal: No abdominal pain.  No vomiting.  No diarrhea.  No constipation. Genitourinary: Negative for dysuria. Musculoskeletal: Negative for back pain. Skin: Negative for rash. Neurological: Negative for headaches, focal weakness or numbness.  10-point ROS otherwise negative.  ____________________________________________   PHYSICAL EXAM:  VITAL SIGNS: ED Triage Vitals  Enc Vitals Group     BP 05/24/16 0842 139/83     Pulse Rate 05/24/16 0842 93     Resp 05/24/16 0842 18     Temp 05/24/16 0842 97.6 F (36.4 C)     Temp Source 05/24/16 0842 Oral     SpO2 05/24/16 0842 100 %     Weight 05/24/16 0833 225 lb (102.1 kg)     Height 05/24/16 0833 5\' 11"  (1.803 m)     Head Circumference --      Peak Flow --      Pain Score --      Pain Loc --      Pain Edu? --      Excl. in GC? --     Constitutional: Alert and oriented. Well appearing and in no acute distress. Eyes: Conjunctivae are normal. PERRL. EOMI. Head: Atraumatic. Nose: No congestion/rhinnorhea. Mouth/Throat: Mucous membranes are moist.  Oropharynx non-erythematous. Neck: No stridor.   Cardiovascular: Normal rate, regular rhythm. Grossly normal heart sounds.  Good peripheral circulation. Respiratory: Normal respiratory effort.  No retractions. Lungs CTAB. Gastrointestinal: Soft and nontender. No distention. No  abdominal bruits. No CVA tenderness. Musculoskeletal: No lower extremity tenderness nor edema.  No joint effusions. Neurologic:  Normal speech and language. No gross focal neurologic deficits are appreciated. Normal cranial nerve exam. No pronator drift. Equal face. No trouble speaking. No gait instability. Skin:  Skin is warm, dry and intact. No rash noted. Psychiatric: Mood and affect are normal. Speech and behavior are normal.  ____________________________________________   LABS (all labs ordered are listed, but only abnormal results are displayed)  Labs Reviewed  BASIC METABOLIC PANEL - Abnormal; Notable for the following:       Result Value   Glucose, Bld 116 (*)    All other components within normal limits  CBC  TROPONIN I   ____________________________________________  EKG  Reviewed and interpreted by me at 8:40 AM Heart rate 80 QRS 90 QTc 4:30 Normal sinus  rhythm, no evidence of T-wave abnormality or ischemia ____________________________________________  RADIOLOGY  Dg Chest 2 View  Result Date: 05/24/2016 CLINICAL DATA:  Chest pain. EXAM: CHEST  2 VIEW COMPARISON:  Radiographs of August 29, 2015. FINDINGS: The heart size and mediastinal contours are within normal limits. Both lungs are clear. No pneumothorax or pleural effusion is noted. The visualized skeletal structures are unremarkable. IMPRESSION: No active cardiopulmonary disease. Electronically Signed   By: Lupita Raider, M.D.   On: 05/24/2016 09:00    ____________________________________________   PROCEDURES  Procedure(s) performed: None  Procedures  Critical Care performed: No  ____________________________________________   INITIAL IMPRESSION / ASSESSMENT AND PLAN / ED COURSE  Pertinent labs & imaging results that were available during my care of the patient were reviewed by me and considered in my medical decision making (see chart for details).  Patient transfer brief episode of feeling  nauseated and diaphoretic. His symptoms have improved, and his exam is normal. He does not have any associated neurologic cardiac respiratory other symptoms. He denies any abdominal pain, fever, and is abdominal exam is very reassuring at this time. Although I'm not quite sure what the cause was, potentially may be associated with not eating and being a diabetic and I encouraged him to keep a glucometer with him and to check of his blood sugars to be low with this happens again, in addition I recommended he not skip meals given the medication that he is on. The patient is agreeable, and we discuss careful return precautions. He will also follow up with Dr. Darrick Huntsman his primary doctor  Clinical Course     ____________________________________________   FINAL CLINICAL IMPRESSION(S) / ED DIAGNOSES  Final diagnoses:  Diaphoresis      NEW MEDICATIONS STARTED DURING THIS VISIT:  Discharge Medication List as of 05/24/2016 10:48 AM       Note:  This document was prepared using Dragon voice recognition software and may include unintentional dictation errors.     Sharyn Creamer, MD 05/24/16 [phone]

## 2016-05-24 NOTE — ED Notes (Signed)
Patient's only complaint is intermittent nausea. Patient states the nausea "was severe and overwhelming". Patient only complaint is mild nausea at this time.

## 2016-05-24 NOTE — Telephone Encounter (Signed)
Pt went to ED this morning. See ED encounter.

## 2016-05-26 ENCOUNTER — Other Ambulatory Visit: Payer: Self-pay | Admitting: Internal Medicine

## 2016-05-26 DIAGNOSIS — I208 Other forms of angina pectoris: Secondary | ICD-10-CM

## 2016-05-27 ENCOUNTER — Ambulatory Visit (INDEPENDENT_AMBULATORY_CARE_PROVIDER_SITE_OTHER): Payer: BC Managed Care – PPO | Admitting: Internal Medicine

## 2016-05-27 ENCOUNTER — Encounter: Payer: Self-pay | Admitting: Internal Medicine

## 2016-05-27 VITALS — BP 168/90 | HR 115 | Temp 99.1°F | Ht 70.0 in | Wt 226.8 lb

## 2016-05-27 DIAGNOSIS — E119 Type 2 diabetes mellitus without complications: Secondary | ICD-10-CM | POA: Diagnosis not present

## 2016-05-27 DIAGNOSIS — R112 Nausea with vomiting, unspecified: Secondary | ICD-10-CM | POA: Diagnosis not present

## 2016-05-27 DIAGNOSIS — B9789 Other viral agents as the cause of diseases classified elsewhere: Secondary | ICD-10-CM

## 2016-05-27 DIAGNOSIS — J069 Acute upper respiratory infection, unspecified: Secondary | ICD-10-CM | POA: Diagnosis not present

## 2016-05-27 DIAGNOSIS — I10 Essential (primary) hypertension: Secondary | ICD-10-CM

## 2016-05-27 DIAGNOSIS — R0789 Other chest pain: Secondary | ICD-10-CM

## 2016-05-27 MED ORDER — NITROGLYCERIN 0.4 MG SL SUBL: 0.4000 mg | SUBLINGUAL_TABLET | SUBLINGUAL | 3 refills | Status: DC | PRN

## 2016-05-27 MED ORDER — HYDROCOD POLST-CPM POLST ER 10-8 MG/5ML PO SUER: 5.0000 mL | Freq: Every evening | ORAL | 0 refills | Status: DC | PRN

## 2016-05-27 MED ORDER — AMOXICILLIN-POT CLAVULANATE 875-125 MG PO TABS: 1.0000 | ORAL_TABLET | Freq: Two times a day (BID) | ORAL | 0 refills | Status: DC

## 2016-05-27 MED ORDER — OMEPRAZOLE 40 MG PO CPDR: 40.0000 mg | DELAYED_RELEASE_CAPSULE | Freq: Every day | ORAL | 3 refills | Status: DC

## 2016-05-27 NOTE — Progress Notes (Signed)
Pre-visit discussion using our clinic review tool. No additional management support is needed unless otherwise documented below in the visit note.  

## 2016-05-27 NOTE — Patient Instructions (Addendum)
You currently have an adenovirus infection   Stop the sudafed and start using Afrin 12 hour spray with menthol for a maximum  of 5 days.    Use the liquid Tussionex for the nighttime cough.  Robitussin for daytime  Start the augmentin only if you develop any of the following symptoms:   fevers (T > 100.4) Facial pain or ear pain  sputum/nasal drainage that is green or blood streaked   I am Starting you on omeprazole for gastritis.  Take once daily in the morning 20 minutes prior to food  Nitroglycerin tablet under tongue if you have another episode of nausea and sweating

## 2016-05-27 NOTE — Progress Notes (Signed)
Subjective:  Patient ID: Joshua Murdochverett C Puthoff, male    DOB: [birth year]  Age: 50 y.o. MRN: [id number]  CC: The primary encounter diagnosis was Diabetes mellitus without complication (HCC). Diagnoses of Non-intractable vomiting with nausea, unspecified vomiting type, Other chest pain, Viral URI with cough, and Essential hypertension were also pertinent to this visit.  HPI Joshua Arellano was  worked in urgently because he sent a [employer] message asking if nausea and diaphoresis could be a sign of myocardial ischemia. Since his Mychart message was sent he was advised to go to ED by his wife Joshua Arellano for evaluation of symptoms  And was seeon on Oct 20th.  EKG and tropnin were done and not concerning for AMI.   History:  Patient states that he developed a very sore throat on Friday night , following by  Sneezing,  Cough and rhinitis.  He was seen in the ED Friday afternoon because in Friday morning while at work at around 6:30 am  preparing for a meeting , he started feeling nauseated and diaphoretic. He had taken his medications for dai bets that morning and had eaten a light breakfast.  BS in ED was 116.   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  He states that he developed sudden onset of nausea and diaphoresis and vomited a small amount of partially digested food. He was not fasting. Sympotms were accompanied by vague substernal chest pressure and  have not  occurred while working out at [employer]. However he has not worked out in several months due to work stressors and increased responsibilities.  He has Type 2 DM and has not been checking sugars   Lab Results  Component Value Date   HGBA1C 7.5 (H) 12/27/2015      Outpatient Medications Prior to Visit  Medication Sig Dispense Refill  . amLODipine (NORVASC) 10 MG tablet TAKE 1 TABLET(10 MG) BY MOUTH DAILY 90 tablet 3  . aspirin 81 MG chewable tablet CHEW AND SWALLOW 1 TABLET  BY MOUTH EVERY DAY 90 tablet 3  . atorvastatin (LIPITOR) 20 MG tablet TAKE 1 TABLET BY MOUTH EVERY DAY 90 tablet 2  . BAYER CONTOUR TEST test strip USE AS DIRECTED 150 each 1  . glipiZIDE (GLUCOTROL) 5 MG tablet Take 0.5 tablets (2.5 mg total) by mouth daily before breakfast. 60 tablet 3  . hydrochlorothiazide (HYDRODIURIL) 25 MG tablet Take 1 tablet (25 mg total) by mouth daily. 90 tablet 3  . JARDIANCE 25 MG TABS tablet Take 1 tablet by mouth daily.  3  . metFORMIN (GLUCOPHAGE-XR) 500 MG 24 hr tablet Take 2,000 mg by mouth daily.  4  . Multiple Vitamins-Minerals (MULTIVITAMIN WITH MINERALS) tablet Take 1 tablet by mouth daily.    Marland Kitchen. VICTOZA 18 MG/3ML SOPN Inject 1.8 mg into the skin daily.  1  . etodolac (LODINE) 500 MG tablet Take 1 tablet by mouth 2 (two) times daily.    . valsartan (DIOVAN) 80 MG tablet Take 80 mg by mouth daily.    Marland Kitchen. trimethoprim-polymyxin b (POLYTRIM) ophthalmic solution 1 drop to the effected eye(s) 4 times daily x 5 days. (Patient not taking: Reported on 05/27/2016) 10 mL 0   No facility-administered medications prior to visit.     Review of Systems;  Patient denies headache, fevers, malaise, unintentional weight loss, skin rash, eye pain, sinus congestion and sinus pain, sore throat, dysphagia,  hemoptysis , cough, dyspnea, wheezing, ,  palpitations, orthopnea, edema,  melena, diarrhea, constipation, flank pain, dysuria, hematuria, urinary  Frequency, nocturia, numbness, tingling, seizures,  Focal weakness, Loss of consciousness,  Tremor, insomnia, depression, anxiety, and suicidal ideation.      Objective:  BP (!) 168/90   Pulse (!) 115   Temp 99.1 F (37.3 C) (Oral)   Ht 5\' 10"  (1.778 m)   Wt 226 lb 12 oz (102.9 kg)   BMI 32.54 kg/m   BP Readings from Last 3 Encounters:  05/27/16 (!) 168/90  05/24/16 135/79  05/08/16 128/78    Wt Readings from Last 3 Encounters:  05/27/16 226 lb 12 oz (102.9 kg)  05/24/16 225 lb (102.1 kg)  05/08/16 229 lb (103.9  kg)    General appearance: alert, cooperative and appears stated age Ears: normal TM's and external ear canals both ears Throat: lips, mucosa, and tongue normal; teeth and gums normal Neck: no adenopathy, no carotid bruit, supple, symmetrical, trachea midline and thyroid not enlarged, symmetric, no tenderness/mass/nodules Back: symmetric, no curvature. ROM normal. No CVA tenderness. Lungs: clear to auscultation bilaterally Heart: regular rate and rhythm, S1, S2 normal, no murmur, click, rub or gallop Abdomen: pbese, tender in mid epigastric area, bowel sounds normal; no masses,  no organomegaly Pulses: 2+ and symmetric Skin: Skin color, texture, turgor normal. No rashes or lesions Lymph nodes: Cervical, supraclavicular, and axillary nodes normal.  Lab Results  Component Value Date   HGBA1C 7.5 (H) 12/27/2015   HGBA1C 7.5 (H) 08/29/2015   HGBA1C 7.6 (H) 02/09/2015    Lab Results  Component Value Date   CREATININE 0.84 05/24/2016   CREATININE 0.84 12/27/2015   CREATININE 1.01 08/29/2015    Lab Results  Component Value Date   WBC 9.3 05/24/2016   HGB 16.1 05/24/2016   HCT 46.8 05/24/2016   PLT 294 05/24/2016   GLUCOSE 116 (H) 05/24/2016   CHOL 132 02/09/2015   TRIG 104.0 02/09/2015   HDL 33.60 (L) 02/09/2015   LDLDIRECT 82 12/23/2014   LDLCALC 78 02/09/2015   ALT 19 12/27/2015   AST 17 12/27/2015   NA 141 05/24/2016   K 3.7 05/24/2016   CL 106 05/24/2016   CREATININE 0.84 05/24/2016   BUN 8 05/24/2016   CO2 27 05/24/2016   TSH 0.64 08/29/2015   PSA 0.45 02/09/2015   HGBA1C 7.5 (H) 12/27/2015   MICROALBUR <0.7 12/27/2015    Dg Chest 2 View  Result Date: 05/24/2016 CLINICAL DATA:  Chest pain. EXAM: CHEST  2 VIEW COMPARISON:  Radiographs of August 29, 2015. FINDINGS: The heart size and mediastinal contours are within normal limits. Both lungs are clear. No pneumothorax or pleural effusion is noted. The visualized skeletal structures are unremarkable. IMPRESSION:  No active cardiopulmonary disease. Electronically Signed   By: Lupita Raider, M.D.   On: 05/24/2016 09:00    Assessment & Plan:   Problem List Items Addressed This Visit    Hypertension    Currently elevated due to use of Sudafe PE 12 hour for URI symptoms.  No changs today,  Stop oral decongestant       Relevant Medications   nitroGLYCERIN (NITROSTAT) 0.4 MG SL tablet   Chest pain    Atypical, in a patient with CRFS including male gender, type 2 DM, hyperlipidemia and obesity.  EKG and Troponin from ER visit reviewed,  No episodes since then,  Adding PPI  Daily for presumed gastritis,   Prn NTG and cardiology evaluation already set up for  Monday. Also consider  GB as etiology and will set up abd ultrasound,   Lab Results  Component Value Date   CHOL 132 02/09/2015   HDL 33.60 (L) 02/09/2015   LDLCALC 78 02/09/2015   LDLDIRECT 82 12/23/2014   TRIG 104.0 02/09/2015   CHOLHDL 4 02/09/2015   Lab Results  Component Value Date   TROPONINI <0.03 05/24/2016         Relevant Orders   US Abdomen Complete   Viral URI with cough    URI is most likely viral given the mild HEENT Symptoms  And normal exam.   I have explained that in viral URIS, an antibiotic will not help the symptoms and will increase the risk of developing diarrhea.,  Continue o nasal decongestants,prn tylenol 650 mq 8 hrs for aches and pains,  Sinus flushes with Lloyd Huger Med's rinse, and cough suppressant.   Advised to start antibiotic only if symptoms of bacterial sinusitis occur, and advised to take probiotic if the antibiotic is taken,  For a minimum of 3 weeks.       Diabetes mellitus without complication (HCC) - Primary   Relevant Orders   Hemoglobin A1c   Comprehensive metabolic panel   POCT UA - Microalbumin   Microalbumin / creatinine urine ratio    Other Visit Diagnoses    Non-intractable vomiting with nausea, unspecified vomiting type       Relevant Orders   CBC With Differential   Urinalysis, Routine w  reflex microscopic   H. pylori antibody, IgG   CBC w/Diff   US Abdomen Complete     A total of 25 minutes of face to face time was spent with patient more than half of which was spent in counselling about the above mentioned conditions  and coordination of care  I have discontinued Mr. Dimmer's trimethoprim-polymyxin b. I am also having him start on chlorpheniramine-HYDROcodone, nitroGLYCERIN, omeprazole, and amoxicillin-clavulanate. Additionally, I am having him maintain his multivitamin with minerals, BAYER CONTOUR TEST, VICTOZA, metFORMIN, atorvastatin, glipiZIDE, valsartan, hydrochlorothiazide, JARDIANCE, aspirin, amLODipine, and etodolac.  Meds ordered this encounter  Medications  . chlorpheniramine-HYDROcodone (TUSSIONEX PENNKINETIC ER) 10-8 MG/5ML SUER    Sig: Take 5 mLs by mouth at bedtime as needed for cough.    Dispense:  140 mL    Refill:  0  . nitroGLYCERIN (NITROSTAT) 0.4 MG SL tablet    Sig: Place 1 tablet (0.4 mg total) under the tongue every 5 (five) minutes as needed for chest pain.    Dispense:  50 tablet    Refill:  3  . omeprazole (PRILOSEC) 40 MG capsule    Sig: Take 1 capsule (40 mg total) by mouth daily.    Dispense:  30 capsule    Refill:  3  . amoxicillin-clavulanate (AUGMENTIN) 875-125 MG tablet    Sig: Take 1 tablet by mouth 2 (two) times daily.    Dispense:  14 tablet    Refill:  0    Medications Discontinued During This Encounter  Medication Reason  . trimethoprim-polymyxin b (POLYTRIM) ophthalmic solution Completed Course    Follow-up: No Follow-up on file.   Sherlene Shams, MD

## 2016-05-28 ENCOUNTER — Encounter: Payer: Self-pay | Admitting: Internal Medicine

## 2016-05-28 DIAGNOSIS — B9789 Other viral agents as the cause of diseases classified elsewhere: Secondary | ICD-10-CM

## 2016-05-28 DIAGNOSIS — R079 Chest pain, unspecified: Secondary | ICD-10-CM | POA: Insufficient documentation

## 2016-05-28 DIAGNOSIS — J069 Acute upper respiratory infection, unspecified: Secondary | ICD-10-CM | POA: Insufficient documentation

## 2016-05-28 LAB — URINALYSIS, ROUTINE W REFLEX MICROSCOPIC
BILIRUBIN URINE: NEGATIVE
Hgb urine dipstick: NEGATIVE
LEUKOCYTES UA: NEGATIVE
NITRITE: NEGATIVE
PH: 5 (ref 5.0–8.0)
RBC / HPF: NONE SEEN (ref 0–?)
Specific Gravity, Urine: <=1.005 — AB (ref 1.000–1.030)
Total Protein, Urine: NEGATIVE
UROBILINOGEN UA: 0.2 (ref 0.0–1.0)
Urine Glucose: >=1000 — AB

## 2016-05-28 LAB — CBC WITH DIFFERENTIAL/PLATELET
BASOS PCT: 0.3 % (ref 0.0–3.0)
Basophils Absolute: 0 10*3/uL (ref 0.0–0.1)
EOS ABS: 0 10*3/uL (ref 0.0–0.7)
Eosinophils Relative: 0.2 % (ref 0.0–5.0)
HEMATOCRIT: 46 % (ref 39.0–52.0)
Hemoglobin: 15.9 g/dL (ref 13.0–17.0)
LYMPHS PCT: 10.5 % — AB (ref 12.0–46.0)
Lymphs Abs: 1.3 10*3/uL (ref 0.7–4.0)
MCHC: 34.5 g/dL (ref 30.0–36.0)
MCV: 84.9 fl (ref 78.0–100.0)
Monocytes Absolute: 1.5 10*3/uL — ABNORMAL HIGH (ref 0.1–1.0)
Monocytes Relative: 11.4 % (ref 3.0–12.0)
NEUTROS ABS: 9.9 10*3/uL — AB (ref 1.4–7.7)
Neutrophils Relative %: 77.6 % — ABNORMAL HIGH (ref 43.0–77.0)
PLATELETS: 307 10*3/uL (ref 150.0–400.0)
RBC: 5.42 Mil/uL (ref 4.22–5.81)
RDW: 12.9 % (ref 11.5–15.5)
WBC: 12.7 10*3/uL — ABNORMAL HIGH (ref 4.0–10.5)

## 2016-05-28 LAB — COMPREHENSIVE METABOLIC PANEL
ALBUMIN: 4.7 g/dL (ref 3.5–5.2)
ALT: 24 U/L (ref 0–53)
AST: 20 U/L (ref 0–37)
Alkaline Phosphatase: 65 U/L (ref 39–117)
BUN: 13 mg/dL (ref 6–23)
CALCIUM: 10.6 mg/dL — AB (ref 8.4–10.5)
CHLORIDE: 96 meq/L (ref 96–112)
CO2: 28 mEq/L (ref 19–32)
Creatinine, Ser: 1.02 mg/dL (ref 0.40–1.50)
GFR: 81.43 mL/min (ref >60.00)
Glucose, Bld: 141 mg/dL — ABNORMAL HIGH (ref 70–99)
POTASSIUM: 3.8 meq/L (ref 3.5–5.1)
Sodium: 136 mEq/L (ref 135–145)
Total Bilirubin: 0.5 mg/dL (ref 0.2–1.2)
Total Protein: 7.6 g/dL (ref 6.0–8.3)

## 2016-05-28 LAB — MICROALBUMIN / CREATININE URINE RATIO
Creatinine,U: 39 mg/dL
Microalb Creat Ratio: 1.8 mg/g (ref 0.0–30.0)

## 2016-05-28 LAB — H. PYLORI ANTIBODY, IGG: H PYLORI IGG: NEGATIVE

## 2016-05-28 LAB — HEMOGLOBIN A1C: Hgb A1c MFr Bld: 8.3 % — ABNORMAL HIGH (ref 4.6–6.5)

## 2016-05-28 NOTE — Assessment & Plan Note (Signed)
Currently elevated due to use of Sudafe PE 12 hour for URI symptoms.  No changs today,  Stop oral decongestant

## 2016-05-28 NOTE — Assessment & Plan Note (Signed)
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  EKG and Troponin from ER visit reviewed,  No episodes since then,  Adding PPI  Daily for presumed gastritis,   Prn NTG and cardiology evaluation already set up for  Monday. Also consider GB as etiology and will set up abd ultrasound,   Lab Results  Component Value Date   CHOL 132 02/09/2015   HDL 33.60 (L) 02/09/2015   LDLCALC 78 02/09/2015   LDLDIRECT 82 12/23/2014   TRIG 104.0 02/09/2015   CHOLHDL 4 02/09/2015   Lab Results  Component Value Date   TROPONINI <0.03 05/24/2016

## 2016-05-28 NOTE — Assessment & Plan Note (Addendum)
URI is most likely viral given the mild HEENT Symptoms  And normal exam.   I have explained that in viral URIS, an antibiotic will not help the symptoms and will increase the risk of developing diarrhea.,  Continue o nasal decongestants,prn tylenol 650 mq 8 hrs for aches and pains,  Sinus flushes with [facility] Huger[facility] Med's rinse, and cough suppressant.   Advised to start antibiotic only if symptoms of bacterial sinusitis occur, and advised to take probiotic if the antibiotic is taken,  For a minimum of 3 weeks.

## 2016-05-29 ENCOUNTER — Telehealth: Payer: Self-pay | Admitting: *Deleted

## 2016-05-29 NOTE — Telephone Encounter (Signed)
Pt requested a explanation to the reason he would need the abdominal Scan  Pt contact [phone] a message can be left on voicemail

## 2016-05-29 NOTE — Telephone Encounter (Signed)
Left detailed message to let patient know that US is to R/O gallbladder issues.

## 2016-06-02 ENCOUNTER — Encounter: Payer: Self-pay | Admitting: Internal Medicine

## 2016-06-03 ENCOUNTER — Encounter: Payer: Self-pay | Admitting: Internal Medicine

## 2016-06-03 ENCOUNTER — Ambulatory Visit (INDEPENDENT_AMBULATORY_CARE_PROVIDER_SITE_OTHER): Payer: BC Managed Care – PPO | Admitting: Cardiovascular Disease

## 2016-06-03 ENCOUNTER — Ambulatory Visit
Admission: RE | Admit: 2016-06-03 | Discharge: 2016-06-03 | Disposition: A | Discharge status: Home / Self Care | Payer: BC Managed Care – PPO | Source: Ambulatory Visit | Attending: Internal Medicine | Admitting: Internal Medicine

## 2016-06-03 ENCOUNTER — Encounter: Payer: Self-pay | Admitting: Cardiovascular Disease

## 2016-06-03 VITALS — BP 134/70 | HR 94 | Ht 70.0 in | Wt 224.5 lb

## 2016-06-03 DIAGNOSIS — R0600 Dyspnea, unspecified: Secondary | ICD-10-CM

## 2016-06-03 DIAGNOSIS — E7800 Pure hypercholesterolemia, unspecified: Secondary | ICD-10-CM

## 2016-06-03 DIAGNOSIS — I10 Essential (primary) hypertension: Secondary | ICD-10-CM

## 2016-06-03 DIAGNOSIS — R932 Abnormal findings on diagnostic imaging of liver and biliary tract: Secondary | ICD-10-CM | POA: Insufficient documentation

## 2016-06-03 DIAGNOSIS — R112 Nausea with vomiting, unspecified: Secondary | ICD-10-CM | POA: Insufficient documentation

## 2016-06-03 DIAGNOSIS — R0789 Other chest pain: Secondary | ICD-10-CM | POA: Insufficient documentation

## 2016-06-03 MED ORDER — AMOXICILLIN-POT CLAVULANATE 875-125 MG PO TABS: 1.0000 | ORAL_TABLET | Freq: Two times a day (BID) | ORAL | 0 refills | Status: DC

## 2016-06-03 NOTE — Progress Notes (Signed)
Cardiology Office Note   Date:  06/03/2016   ID:  Joshua Arellano, DOB May 07, 1966, MRN [id number]  PCP:  Sherlene Shams, MD  Cardiologist:   Lorine Bears, MD    Chief Complaint  Patient presents with  . other    F/u from ED due to fatigue, nausea and unusual sweating. Meds reviewed verbally with pt.       History of Present Illness: Joshua Arellano is a 50 y.o. male who Was referred by Dr.Tullo for evaluation of fatigue, diaphoresis and nausea. The patient has no previous cardiac history. He has multiple chronic medical conditions that include diabetes mellitus which was diagnosed in his early 30s, hypertension, hyperlipidemia, sleep apnea on CPAP and obesity. He has been complaining of fatigue over the last year and was ultimately diagnosed with sleep apnea. He reports that treatment with CPAP improved his quality of sleep but did not improve his fatigue. Over the last few weeks, he had intermittent episodes of sudden nausea and diaphoresis mostly in the setting of being anxious and stressed. He used to go to the gym but stopped since the summer. He denies any chest discomfort but does complain of exertional dyspnea with moderate activities. He had stress testing in the remote past. He is not a smoker. There is no family history of premature coronary artery disease. He had emergency room evaluation for the symptoms which included negative troponin and unremarkable EKG.   Past Medical History:  Diagnosis Date  . Diabetes mellitus   . Hemorrhoid   . Hemorrhoids   . Hyperlipidemia   . Hypertension   . Knee pain, left    episodic  . Obesity (BMI 30-39.9)   . Sleep apnea    Uses C-PAP machine    Past Surgical History:  Procedure Laterality Date  . ANOSCOPY  10 years ago   Hemorrhoids  . COLONOSCOPY WITH PROPOFOL N/A 02/28/2016   Procedure: COLONOSCOPY WITH PROPOFOL;  Surgeon: Earline Mayotte, MD;  Location: [facility] ENDOSCOPY;  Service: Endoscopy;  Laterality: N/A;  .  TONSILLECTOMY  1972     Current Outpatient Prescriptions  Medication Sig Dispense Refill  . amLODipine (NORVASC) 10 MG tablet TAKE 1 TABLET(10 MG) BY MOUTH DAILY 90 tablet 3  . aspirin 81 MG chewable tablet CHEW AND SWALLOW 1 TABLET BY MOUTH EVERY DAY 90 tablet 3  . atorvastatin (LIPITOR) 20 MG tablet TAKE 1 TABLET BY MOUTH EVERY DAY 90 tablet 2  . BAYER CONTOUR TEST test strip USE AS DIRECTED 150 each 1  . glipiZIDE (GLUCOTROL) 5 MG tablet Take 0.5 tablets (2.5 mg total) by mouth daily before breakfast. 60 tablet 3  . hydrochlorothiazide (HYDRODIURIL) 25 MG tablet Take 1 tablet (25 mg total) by mouth daily. 90 tablet 3  . JARDIANCE 25 MG TABS tablet Take 1 tablet by mouth daily.  3  . metFORMIN (GLUCOPHAGE-XR) 500 MG 24 hr tablet Take 2,000 mg by mouth daily.  4  . Multiple Vitamins-Minerals (MULTIVITAMIN WITH MINERALS) tablet Take 1 tablet by mouth daily.    . nitroGLYCERIN (NITROSTAT) 0.4 MG SL tablet Place 1 tablet (0.4 mg total) under the tongue every 5 (five) minutes as needed for chest pain. 50 tablet 3  . omeprazole (PRILOSEC) 40 MG capsule Take 1 capsule (40 mg total) by mouth daily. 30 capsule 3  . valsartan (DIOVAN) 80 MG tablet Take 80 mg by mouth daily.    Marland Kitchen VICTOZA 18 MG/3ML SOPN Inject 1.8 mg into the skin daily.  1  . amoxicillin-clavulanate (AUGMENTIN) 875-125 MG tablet Take 1 tablet by mouth 2 (two) times daily. (Patient not taking: Reported on 06/03/2016) 14 tablet 0   No current facility-administered medications for this visit.     Allergies:   Latex and Lisinopril    Social History:  The patient  reports that he has never smoked. He has never used smokeless tobacco. He reports that he drinks alcohol. He reports that he does not use drugs.   Family History:  The patient's family history includes Cancer in his maternal uncle; Heart attack in his maternal grandfather; Heart disease in his maternal grandfather; Hypertension in his maternal grandfather, paternal  grandfather, and paternal grandmother; Transient ischemic attack in his father.    ROS:  Please see the history of present illness.   Otherwise, review of systems are positive for none.   All other systems are reviewed and negative.    PHYSICAL EXAM: VS:  BP 134/70 (BP Location: Right Arm, Patient Position: Sitting, Cuff Size: Normal)   Pulse 94   Ht 5\' 10"  (1.778 m)   Wt 224 lb 8 oz (101.8 kg)   BMI 32.21 kg/m  , BMI Body mass index is 32.21 kg/m. GEN: Well nourished, well developed, in no acute distress  HEENT: normal  Neck: no JVD, carotid bruits, or masses Cardiac: RRR; no murmurs, rubs, or gallops,no edema  Respiratory:  clear to auscultation bilaterally, normal work of breathing GI: soft, nontender, nondistended, + BS MS: no deformity or atrophy  Skin: warm and dry, no rash Neuro:  Strength and sensation are intact Psych: euthymic mood, full affect   EKG:  EKG is ordered today. The ekg ordered today demonstrates normal sinus rhythm with nonspecific ST and T wave changes mostly in the inferior leads.   Recent Labs: 08/29/2015: TSH 0.64 05/27/2016: ALT 24; BUN 13; Creatinine, Ser 1.02; Hemoglobin 15.9; Platelets 307.0; Potassium 3.8; Sodium 136    Lipid Panel    Component Value Date/Time   CHOL 132 02/09/2015 0845   TRIG 104.0 02/09/2015 0845   HDL 33.60 (L) 02/09/2015 0845   CHOLHDL 4 02/09/2015 0845   VLDL 20.8 02/09/2015 0845   LDLCALC 78 02/09/2015 0845   LDLDIRECT 82 12/23/2014 1643      Wt Readings from Last 3 Encounters:  06/03/16 224 lb 8 oz (101.8 kg)  05/27/16 226 lb 12 oz (102.9 kg)  05/24/16 225 lb (102.1 kg)        PAD Screen 06/03/2016  Previous PAD dx? No  Previous surgical procedure? No  Pain with walking? No  Feet/toe relief with dangling? No  Painful, non-healing ulcers? No  Extremities discolored? No      ASSESSMENT AND PLAN:  1.  Possible angina equivalent: The patient has symptoms manifested mostly by nausea and  diaphoresis. These symptoms are not exertional. Exertional symptoms include dyspnea without chest discomfort. He has multiple risk factors for coronary artery disease including prolonged history of diabetes mellitus. Due to that, I recommend evaluation with a treadmill nuclear stress test. His EKG shows nonspecific ST and T wave changes and thus a treadmill stress test alone is not diagnostic. I also requested an echocardiogram to ensure no pulmonary hypertension given his known history of sleep apnea. If cardiac evaluation is unremarkable with persistent symptoms, can consider GI evaluation. I discussed with him the importance of controlling his risk factors and the importance of healthy lifestyle changes and getting back into an exercise program.  2. Essential hypertension: Blood pressure is controlled  on current medications.  3. Hyperlipidemia: Currently on atorvastatin with most recent LDL of 78.    Disposition:   FU with me as needed.   Signed,  Lorine BearsMuhammad Eliasar Hlavaty, MD  06/03/2016 3:41 PM    [facility]

## 2016-06-03 NOTE — Telephone Encounter (Signed)
Cloudy mucus but not green ,afebrile a little blood tinged. Patient asking for the amoxicillin to be sent in has lost script.

## 2016-06-03 NOTE — Patient Instructions (Addendum)
Medication Instructions:  Your physician recommends that you continue on your current medications as directed. Please refer to the Current Medication list given to you today.   Labwork: none  Testing/Procedures: Your physician has requested that you have an echocardiogram. Echocardiography is a painless test that uses sound waves to create images of your heart. It provides your doctor with information about the size and shape of your heart and how well your heart's chambers and valves are working. This procedure takes approximately one hour. There are no restrictions for this procedure.  Your physician has requested that you have a lexiscan myoview. For further information please visit [email or web address]. Please follow instruction sheet, as given.  [facility] MYOVIEW  Your caregiver has ordered a Stress Test with nuclear imaging. The purpose of this test is to evaluate the blood supply to your heart muscle. This procedure is referred to as a "Non-Invasive Stress Test." This is because other than having an IV started in your vein, nothing is inserted or "invades" your body. Cardiac stress tests are done to find areas of poor blood flow to the heart by determining the extent of coronary artery disease (CAD). Some patients exercise on a treadmill, which naturally increases the blood flow to your heart, while others who are  unable to walk on a treadmill due to physical limitations have a pharmacologic/chemical stress agent called Lexiscan . This medicine will mimic walking on a treadmill by temporarily increasing your coronary blood flow.   Please note: these test may take anywhere between 2-4 hours to complete  [facility]  THE VOLUNTEERS AT THE FIRST DESK WILL DIRECT YOU WHERE TO GO  Date of Procedure: Thursday, Nov 9  Arrival Time for Procedure: 7:15am  Instructions regarding medication:   _xx___ : Hold diabetes medication morning of procedure _xx___:  Hold HCTZ  the morning of your test.   PLEASE NOTIFY THE OFFICE AT LEAST 24 HOURS IN ADVANCE IF YOU ARE UNABLE TO KEEP YOUR APPOINTMENT.  [phone] AND  PLEASE NOT[facility] AT LEAST 24 HOURS IN ADVANCE IF YOU ARE UNABLE TO KEEP YOUR APPOINTMENT. [phone]  How to prepare for your Myoview test:  1. Do not eat or drink after midnight 2. No caffeine for 24 hours prior to test 3. No smoking 24 hours prior to test. 4. Your medication may be taken with water.  If your doctor stopped a medication because of this test, do not take that medication. 5. Ladies, please do not wear dresses.  Skirts or pants are appropriate. Please wear a short sleeve shirt. 6. No perfume, cologne or lotion. 7. Wear comfortable walking shoes. No heels!            Follow-Up: Your physician recommends that you schedule a follow-up appointment as needed.    Any Other Special Instructions Will Be Listed Below (If Applicable).     If you need a refill on your cardiac medications before your next appointment, please call your pharmacy.  Echocardiogram An echocardiogram, or echocardiography, uses sound waves (ultrasound) to produce an image of your heart. The echocardiogram is simple, painless, obtained within a short period of time, and offers valuable information to your health care provider. The images from an echocardiogram can provide information such as:  Evidence of coronary artery disease (CAD).  Heart size.  Heart muscle function.  Heart valve function.  Aneurysm detection.  Evidence of a past heart attack.  Fluid buildup around the heart.  Heart muscle  thickening.  Assess heart valve function. LET [facility] PROVIDER KNOW ABOUT:  Any allergies you have.  All medicines you are taking, including vitamins, herbs, eye drops, creams, and over-the-counter medicines.  Previous problems you or members of your family have had with the use of anesthetics.  Any blood  disorders you have.  Previous surgeries you have had.  Medical conditions you have.  Possibility of pregnancy, if this applies. BEFORE THE PROCEDURE  No special preparation is needed. Eat and drink normally.  PROCEDURE   In order to produce an image of your heart, gel will be applied to your chest and a wand-like tool (transducer) will be moved over your chest. The gel will help transmit the sound waves from the transducer. The sound waves will harmlessly bounce off your heart to allow the heart images to be captured in real-time motion. These images will then be recorded.  You may need an IV to receive a medicine that improves the quality of the pictures. AFTER THE PROCEDURE You may return to your normal schedule including diet, activities, and medicines, unless your health care provider tells you otherwise.   This information is not intended to replace advice given to you by your health care provider. Make sure you discuss any questions you have with your health care provider.   Document Released: 07/19/2000 Document Revised: 08/12/2014 Document Reviewed: 03/29/2013 Elsevier Interactive Patient Education 2016 Else[employer] Inc. Cardiac Nuclear Scanning A cardiac nuclear scan is used to check your heart for problems, such as the following:  A portion of the heart is not getting enough blood.  Part of the heart muscle has died, which happens with a heart attack.  The heart wall is not working normally.  In this test, a radioactive dye (tracer) is injected into your bloodstream. After the tracer has traveled to your heart, a scanning device is used to measure how much of the tracer is absorbed by or distributed to various areas of your heart. LET [facility] PROVIDER KNOW ABOUT:  Any allergies you have.  All medicines you are taking, including vitamins, herbs, eye drops, creams, and over-the-counter medicines.  Previous problems you or members of your family have had with the use  of anesthetics.  Any blood disorders you have.  Previous surgeries you have had.  Medical conditions you have.  RISKS AND COMPLICATIONS Generally, this is a safe procedure. However, as with any procedure, problems can occur. Possible problems include:   Serious chest pain.  Rapid heartbeat.  Sensation of warmth in your chest. This usually passes quickly. BEFORE THE PROCEDURE Ask your health care provider about changing or stopping your regular medicines. PROCEDURE This procedure is usually done at a hospital and takes 2-4 hours.  An IV tube is inserted into one of your veins.  Your health care provider will inject a small amount of radioactive tracer through the tube.  You will then wait for 20-40 minutes while the tracer travels through your bloodstream.  You will lie down on an exam table so images of your heart can be taken. Images will be taken for about 15-20 minutes.  You will exercise on a treadmill or stationary bike. While you exercise, your heart activity will be monitored with an electrocardiogram (ECG), and your blood pressure will be checked.  If you are unable to exercise, you may be given a medicine to make your heart beat faster.  When blood flow to your heart has peaked, tracer will again be injected through  the IV tube.  After 20-40 minutes, you will get back on the exam table and have more images taken of your heart.  When the procedure is over, your IV tube will be removed. AFTER THE PROCEDURE  You will likely be able to leave shortly after the test. Unless your health care provider tells you otherwise, you may return to your normal schedule, including diet, activities, and medicines.  Make sure you find out how and when you will get your test results.   This information is not intended to replace advice given to you by your health care provider. Make sure you discuss any questions you have with your health care provider.   Document Released:  08/16/2004 Document Revised: 07/27/2013 Document Reviewed: 06/30/2013 Elsevier Interactive Patient Education [employer]

## 2016-06-03 NOTE — Telephone Encounter (Signed)
Augmentin has been sent to pharmacy

## 2016-06-05 ENCOUNTER — Encounter: Payer: Self-pay | Admitting: Internal Medicine

## 2016-06-12 ENCOUNTER — Telehealth: Payer: Self-pay | Admitting: Cardiovascular Disease

## 2016-06-12 NOTE — Telephone Encounter (Signed)
S/w pt to review myoview instructions. States he has 2 minutes to discuss before the bell rings (he is a Runner, broadcasting/film/videoteacher). States his instructions are to avoid caffeine 12 hours before procedure and that the word "caffeine" is spelled incorrectly. As the bell has just rung, he quickly verbalized understanding of instructions and promptly hung up the phone.

## 2016-06-13 ENCOUNTER — Encounter
Admission: RE | Admit: 2016-06-13 | Discharge: 2016-06-13 | Disposition: A | Discharge status: Home / Self Care | Payer: BC Managed Care – PPO | Source: Ambulatory Visit | Attending: Cardiovascular Disease | Admitting: Cardiovascular Disease

## 2016-06-13 DIAGNOSIS — R0600 Dyspnea, unspecified: Secondary | ICD-10-CM

## 2016-06-21 ENCOUNTER — Encounter
Admission: RE | Admit: 2016-06-21 | Discharge: 2016-06-21 | Disposition: A | Discharge status: Home / Self Care | Payer: BC Managed Care – PPO | Source: Ambulatory Visit | Attending: Cardiovascular Disease | Admitting: Cardiovascular Disease

## 2016-06-21 DIAGNOSIS — R0600 Dyspnea, unspecified: Secondary | ICD-10-CM | POA: Diagnosis not present

## 2016-06-21 LAB — NM MYOCAR MULTI W/SPECT W/WALL MOTION / EF
CHL CUP MPHR: 168 {beats}/min
CHL CUP NUCLEAR SDS: 0
CHL CUP NUCLEAR SRS: 3
CHL CUP NUCLEAR SSS: 2
CHL CUP RESTING HR STRESS: 83 {beats}/min
CSEPEDS: 41 s
CSEPEW: 13.7 METS
CSEPPHR: 179 {beats}/min
Exercise duration (min): 11 min
LV dias vol: 53 mL (ref 62–150)
LVSYSVOL: 18 mL
NUC STRESS TID: 0.8
Percent HR: 106 %

## 2016-06-21 MED ORDER — TECHNETIUM TC 99M TETROFOSMIN IV KIT
32.9200 | PACK | Freq: Once | INTRAVENOUS | Status: AC | PRN
  Administered 2016-06-21: 32.92 via INTRAVENOUS

## 2016-06-21 MED ORDER — TECHNETIUM TC 99M TETROFOSMIN IV KIT
13.0000 | PACK | Freq: Once | INTRAVENOUS | Status: AC | PRN
  Administered 2016-06-21: 12.851 via INTRAVENOUS

## 2016-06-21 MED ORDER — REGADENOSON 0.4 MG/5ML IV SOLN
0.4000 mg | Freq: Once | INTRAVENOUS | Status: AC
  Administered 2016-06-21: 0.4 mg via INTRAVENOUS

## 2016-07-01 ENCOUNTER — Ambulatory Visit (INDEPENDENT_AMBULATORY_CARE_PROVIDER_SITE_OTHER): Payer: BC Managed Care – PPO

## 2016-07-01 ENCOUNTER — Other Ambulatory Visit: Payer: Self-pay

## 2016-07-01 DIAGNOSIS — R0600 Dyspnea, unspecified: Secondary | ICD-10-CM | POA: Diagnosis not present

## 2016-07-26 ENCOUNTER — Other Ambulatory Visit: Payer: Self-pay | Admitting: Internal Medicine

## 2016-10-31 ENCOUNTER — Other Ambulatory Visit: Payer: Self-pay

## 2016-10-31 MED ORDER — HYDROCHLOROTHIAZIDE 25 MG PO TABS: ORAL_TABLET | ORAL | 0 refills | Status: DC

## 2016-12-17 ENCOUNTER — Other Ambulatory Visit: Payer: Self-pay | Admitting: Internal Medicine

## 2017-01-22 LAB — HM DIABETES EYE EXAM

## 2017-01-23 ENCOUNTER — Encounter: Payer: Self-pay | Admitting: Internal Medicine

## 2017-02-02 ENCOUNTER — Other Ambulatory Visit: Payer: Self-pay | Admitting: Internal Medicine

## 2017-02-12 LAB — HEMOGLOBIN A1C: Hemoglobin A1C: 6.4

## 2017-02-24 ENCOUNTER — Other Ambulatory Visit: Payer: Self-pay

## 2017-02-26 ENCOUNTER — Encounter: Payer: Self-pay | Admitting: Internal Medicine

## 2017-02-26 ENCOUNTER — Ambulatory Visit (INDEPENDENT_AMBULATORY_CARE_PROVIDER_SITE_OTHER): Payer: BC Managed Care – PPO | Admitting: Internal Medicine

## 2017-02-26 VITALS — BP 152/78 | HR 94 | Temp 98.0°F | Resp 15 | Ht 70.0 in | Wt 226.2 lb

## 2017-02-26 DIAGNOSIS — E785 Hyperlipidemia, unspecified: Secondary | ICD-10-CM | POA: Diagnosis not present

## 2017-02-26 DIAGNOSIS — Z125 Encounter for screening for malignant neoplasm of prostate: Secondary | ICD-10-CM | POA: Diagnosis not present

## 2017-02-26 DIAGNOSIS — E119 Type 2 diabetes mellitus without complications: Secondary | ICD-10-CM

## 2017-02-26 DIAGNOSIS — I10 Essential (primary) hypertension: Secondary | ICD-10-CM | POA: Diagnosis not present

## 2017-02-26 DIAGNOSIS — E669 Obesity, unspecified: Secondary | ICD-10-CM

## 2017-02-26 DIAGNOSIS — R232 Flushing: Secondary | ICD-10-CM

## 2017-02-26 MED ORDER — LOSARTAN POTASSIUM 100 MG PO TABS: 100.0000 mg | ORAL_TABLET | Freq: Every day | ORAL | 3 refills | Status: DC

## 2017-02-26 MED ORDER — LOSARTAN POTASSIUM-HCTZ 100-12.5 MG PO TABS: 1.0000 | ORAL_TABLET | Freq: Every day | ORAL | 0 refills | Status: DC

## 2017-02-26 NOTE — Progress Notes (Signed)
Subjective:  Patient ID: Joshua Arellano, male    DOB: 1966-06-09  Age: 51 y.o. MRN: [id number]  CC: The primary encounter diagnosis was Hot flash in male. Diagnoses of Hyperlipidemia LDL goal <100, Prostate cancer screening, Obesity (BMI 30-39.9), Essential hypertension, Diabetes mellitus without complication (HCC), and Dyslipidemia, goal LDL below 70 were also pertinent to this visit.  HPI Joshua Arellano presents for follow up on obesity, hypertension and type 2 DM. 3 month follow up on diabetes.  Patient has no complaints today.  Patient is following a low glycemic index diet and taking all prescribed medications regularly without side effects.  He has been monitoring his sugar up to 6 times daily with ease due to use of the [employer] glucose monitoring system and has  Noting that his fasting sugars have been under less than 130 most of the time and post prandials have been under 160 except on rare occasions. Patient is not exercising regularly  But is  intentionally trying to lose weight .  Patient has had an eye exam in the last 12 months and checks feet regularly for signs of infection.  Patient does not walk barefoot outside,  And denies an numbness tingling or burning in feet. Patient is up to date on all recommended vaccinations   Hypertension:  Medications reviewed .  And hange from valsartan needed .Marland Kitchen. Losartan 100 mg daily Lab Results  Component Value Date   HGBA1C 6.4 02/12/2017     Outpatient Medications Prior to Visit  Medication Sig Dispense Refill  . amLODipine (NORVASC) 10 MG tablet TAKE 1 TABLET(10 MG) BY MOUTH DAILY 90 tablet 3  . aspirin 81 MG chewable tablet CHEW AND SWALLOW 1 TABLET BY MOUTH EVERY DAY 90 tablet 0  . atorvastatin (LIPITOR) 20 MG tablet TAKE 1 TABLET BY MOUTH EVERY DAY 90 tablet 2  . BAYER CONTOUR TEST test strip USE AS DIRECTED 150 each 1  . JARDIANCE 25 MG TABS tablet Take 1 tablet by mouth daily.  3  . metFORMIN (GLUCOPHAGE-XR) 500 MG 24 hr  tablet Take 2,000 mg by mouth daily.  4  . Multiple Vitamins-Minerals (MULTIVITAMIN WITH MINERALS) tablet Take 1 tablet by mouth daily.    . nitroGLYCERIN (NITROSTAT) 0.4 MG SL tablet Place 1 tablet (0.4 mg total) under the tongue every 5 (five) minutes as needed for chest pain. 50 tablet 3  . hydrochlorothiazide (HYDRODIURIL) 25 MG tablet TAKE 1 TABLET(25 MG) BY MOUTH DAILY 90 tablet 0  . amoxicillin-clavulanate (AUGMENTIN) 875-125 MG tablet Take 1 tablet by mouth 2 (two) times daily. (Patient not taking: Reported on 06/03/2016) 14 tablet 0  . glipiZIDE (GLUCOTROL) 5 MG tablet Take 0.5 tablets (2.5 mg total) by mouth daily before breakfast. (Patient not taking: Reported on 02/26/2017) 60 tablet 3  . omeprazole (PRILOSEC) 40 MG capsule Take 1 capsule (40 mg total) by mouth daily. (Patient not taking: Reported on 02/26/2017) 30 capsule 3  . valsartan (DIOVAN) 80 MG tablet Take 80 mg by mouth daily.    Marland Kitchen. VICTOZA 18 MG/3ML SOPN Inject 1.8 mg into the skin daily.  1   No facility-administered medications prior to visit.     Review of Systems;  Patient denies headache, fevers, malaise, unintentional weight loss, skin rash, eye pain, sinus congestion and sinus pain, sore throat, dysphagia,  hemoptysis , cough, dyspnea, wheezing, chest pain, palpitations, orthopnea, edema, abdominal pain, nausea, melena, diarrhea, constipation, flank pain, dysuria, hematuria, urinary  Frequency, nocturia, numbness, tingling, seizures,  Focal weakness,  Loss of consciousness,  Tremor, insomnia, depression, anxiety, and suicidal ideation.      Objective:  BP (!) 152/78 (BP Location: Left Arm, Patient Position: Sitting, Cuff Size: Normal)   Pulse 94   Temp 98 F (36.7 C) (Oral)   Resp 15   Ht 5\' 10"  (1.778 m)   Wt 226 lb 3.2 oz (102.6 kg)   SpO2 94%   BMI 32.46 kg/m   BP Readings from Last 3 Encounters:  02/26/17 (!) 152/78  06/03/16 134/70  05/27/16 (!) 168/90    Wt Readings from Last 3 Encounters:    02/26/17 226 lb 3.2 oz (102.6 kg)  06/03/16 224 lb 8 oz (101.8 kg)  05/27/16 226 lb 12 oz (102.9 kg)    General appearance: alert, cooperative and appears stated age Ears: normal TM's and external ear canals both ears Throat: lips, mucosa, and tongue normal; teeth and gums normal Neck: no adenopathy, no carotid bruit, supple, symmetrical, trachea midline and thyroid not enlarged, symmetric, no tenderness/mass/nodules Back: symmetric, no curvature. ROM normal. No CVA tenderness. Lungs: clear to auscultation bilaterally Heart: regular rate and rhythm, S1, S2 normal, no murmur, click, rub or gallop Abdomen: soft, non-tender; bowel sounds normal; no masses,  no organomegaly Pulses: 2+ and symmetric Skin: Skin color, texture, turgor normal. No rashes or lesions Lymph nodes: Cervical, supraclavicular, and axillary nodes normal.  Lab Results  Component Value Date   HGBA1C 6.4 02/12/2017   HGBA1C 8.3 (H) 05/27/2016   HGBA1C 7.5 (H) 12/27/2015    Lab Results  Component Value Date   CREATININE 0.97 02/27/2017   CREATININE 1.02 05/27/2016   CREATININE 0.84 05/24/2016    Lab Results  Component Value Date   WBC 12.7 (H) 05/27/2016   HGB 15.9 05/27/2016   HCT 46.0 05/27/2016   PLT 307.0 05/27/2016   GLUCOSE 111 (H) 02/27/2017   CHOL 124 02/27/2017   TRIG 71.0 02/27/2017   HDL 37.30 (L) 02/27/2017   LDLDIRECT 82 12/23/2014   LDLCALC 72 02/27/2017   ALT 21 02/27/2017   AST 16 02/27/2017   NA 140 02/27/2017   K 4.3 02/27/2017   CL 102 02/27/2017   CREATININE 0.97 02/27/2017   BUN 8 02/27/2017   CO2 32 02/27/2017   TSH 0.64 08/29/2015   PSA 0.50 02/27/2017   HGBA1C 6.4 02/12/2017   MICROALBUR <0.7 05/27/2016    Nm Myocar Multi W/spect W/wall Motion / Ef  Result Date: 06/21/2016  Blood pressure demonstrated a hypertensive response to exercise. Excellent exercise capacity.  There was no ST segment deviation noted during stress.  The study is normal.  This is a low risk  study.  The left ventricular ejection fraction is normal (55-65%).     Assessment & Plan:   Problem List Items Addressed This Visit    Diabetes mellitus without complication (HCC)    SIGNIFICANT IMPROVEMENT IN CONTROL WITH USE OF CONTINUOUS GLUCOSE MONITORING.  NO CHANGES TODAY. Lab Results  Component Value Date   HGBA1C 6.4 02/12/2017   Lab Results  Component Value Date   MICROALBUR <0.7 05/27/2016         Relevant Medications   XULTOPHY 100-3.6 UNIT-MG/ML SOPN   losartan-hydrochlorothiazide (HYZAAR) 100-12.5 MG tablet   Dyslipidemia, goal LDL below 70    He has RESUMED  Lipitor  And LDL is at goal  LFTs are normal. . Lab Results  Component Value Date   CHOL 124 02/27/2017   HDL 37.30 (L) 02/27/2017   LDLCALC 72 02/27/2017  LDLDIRECT 82 12/23/2014   TRIG 71.0 02/27/2017   CHOLHDL 3 02/27/2017   Lab Results  Component Value Date   ALT 21 02/27/2017   AST 16 02/27/2017   ALKPHOS 57 02/27/2017   BILITOT 0.6 02/27/2017         Relevant Medications   losartan-hydrochlorothiazide (HYZAAR) 100-12.5 MG tablet   Hot flash in male - Primary    Checking testosterone levels  Lab Results  Component Value Date   TESTOSTERONE 350 11/17/2012         Relevant Medications   losartan-hydrochlorothiazide (HYZAAR) 100-12.5 MG tablet   Other Relevant Orders   Testos,Total,Free and SHBG (Male)   Hypertension    Changing valsartan to losartan 100 mg ,  Continue amlodipine AND HCTZ      Relevant Medications   losartan-hydrochlorothiazide (HYZAAR) 100-12.5 MG tablet   Obesity (BMI 30-39.9)    I have addressed  BMI and recommended wt loss of 10% of body weigh over the next 6 months by starting a regular exercisie program and continuining to follow a low glycemic index diet       Relevant Medications   XULTOPHY 100-3.6 UNIT-MG/ML SOPN   Prostate cancer screening    psa IS NORMAL AND UNCHANGED FROM LAST YEAR   Lab Results  Component Value Date   PSA 0.50  02/27/2017   PSA 0.45 02/09/2015         Relevant Orders   PSA (Completed)    Other Visit Diagnoses    Hyperlipidemia LDL goal <100       Relevant Medications   losartan-hydrochlorothiazide (HYZAAR) 100-12.5 MG tablet   Other Relevant Orders   Comprehensive metabolic panel (Completed)   Lipid panel (Completed)      I have discontinued Mr. Kilcrease's VICTOZA, glipiZIDE, valsartan, omeprazole, amoxicillin-clavulanate, hydrochlorothiazide, and losartan. I am also having him start on losartan-hydrochlorothiazide. Additionally, I am having him maintain his multivitamin with minerals, BAYER CONTOUR TEST, metFORMIN, atorvastatin, JARDIANCE, amLODipine, nitroGLYCERIN, aspirin, and XULTOPHY.  Meds ordered this encounter  Medications  . XULTOPHY 100-3.6 UNIT-MG/ML SOPN    Sig: INJ 30 UNITS [place] D    Refill:  11  . DISCONTD: losartan (COZAAR) 100 MG tablet    Sig: Take 1 tablet (100 mg total) by mouth daily.    Dispense:  90 tablet    Refill:  3  . losartan-hydrochlorothiazide (HYZAAR) 100-12.5 MG tablet    Sig: Take 1 tablet by mouth daily.    Dispense:  90 tablet    Refill:  0    PHARMACY PLEASE NOTE THAT THIS RX REPLACES, VALSARTAN  L.OSARTAN  AND HCT RX'S    Medications Discontinued During This Encounter  Medication Reason  . VICTOZA 18 MG/3ML SOPN Patient has not taken in last 30 days  . glipiZIDE (GLUCOTROL) 5 MG tablet Patient has not taken in last 30 days  . amoxicillin-clavulanate (AUGMENTIN) 875-125 MG tablet Patient has not taken in last 30 days  . omeprazole (PRILOSEC) 40 MG capsule Patient has not taken in last 30 days  . valsartan (DIOVAN) 80 MG tablet   . losartan (COZAAR) 100 MG tablet   . hydrochlorothiazide (HYDRODIURIL) 25 MG tablet     Follow-up: Return in about 6 months (around 08/29/2017).   Sherlene ShamsULLO, Greysen Swanton L, MD

## 2017-02-26 NOTE — Patient Instructions (Addendum)
I HAVE CHANGED YOUR VALSARTAN AND HCTZ TO A COMBINATION PILL CALLED LOSARTAN/HCT  100/12.5    GOAL IS 130/80 OR LESS.  LET ME KNOW IF NOT THERE IN A FEW WEEKS   RETURN FOR FASTING Labs   Lab Results  Component Value Date   PSA 0.45 02/09/2015

## 2017-02-27 ENCOUNTER — Other Ambulatory Visit (INDEPENDENT_AMBULATORY_CARE_PROVIDER_SITE_OTHER): Payer: BC Managed Care – PPO

## 2017-02-27 DIAGNOSIS — E785 Hyperlipidemia, unspecified: Secondary | ICD-10-CM | POA: Diagnosis not present

## 2017-02-27 DIAGNOSIS — Z125 Encounter for screening for malignant neoplasm of prostate: Secondary | ICD-10-CM

## 2017-02-27 DIAGNOSIS — R232 Flushing: Secondary | ICD-10-CM

## 2017-02-27 LAB — COMPREHENSIVE METABOLIC PANEL WITH GFR
ALT: 21 U/L (ref 0–53)
AST: 16 U/L (ref 0–37)
Albumin: 4.1 g/dL (ref 3.5–5.2)
Alkaline Phosphatase: 57 U/L (ref 39–117)
BUN: 8 mg/dL (ref 6–23)
CO2: 32 meq/L (ref 19–32)
Calcium: 10.2 mg/dL (ref 8.4–10.5)
Chloride: 102 meq/L (ref 96–112)
Creatinine, Ser: 0.97 mg/dL (ref 0.40–1.50)
GFR: 86.04 mL/min (ref >60.00)
Glucose, Bld: 111 mg/dL — ABNORMAL HIGH (ref 70–99)
Potassium: 4.3 meq/L (ref 3.5–5.1)
Sodium: 140 meq/L (ref 135–145)
Total Bilirubin: 0.6 mg/dL (ref 0.2–1.2)
Total Protein: 6.4 g/dL (ref 6.0–8.3)

## 2017-02-27 LAB — LIPID PANEL
Cholesterol: 124 mg/dL (ref 0–200)
HDL: 37.3 mg/dL — ABNORMAL LOW (ref >39.00)
LDL Cholesterol: 72 mg/dL (ref 0–99)
NonHDL: 86.25
Total CHOL/HDL Ratio: 3
Triglycerides: 71 mg/dL (ref 0.0–149.0)
VLDL: 14.2 mg/dL (ref 0.0–40.0)

## 2017-02-27 LAB — PSA: PSA: 0.5 ng/mL (ref 0.10–4.00)

## 2017-02-28 DIAGNOSIS — R232 Flushing: Secondary | ICD-10-CM | POA: Insufficient documentation

## 2017-02-28 NOTE — Assessment & Plan Note (Signed)
SIGNIFICANT IMPROVEMENT IN CONTROL WITH USE OF CONTINUOUS GLUCOSE MONITORING.  NO CHANGES TODAY. Lab Results  Component Value Date   HGBA1C 6.4 02/12/2017   Lab Results  Component Value Date   MICROALBUR <0.7 05/27/2016

## 2017-02-28 NOTE — Assessment & Plan Note (Signed)
psa IS NORMAL AND UNCHANGED FROM LAST YEAR   Lab Results  Component Value Date   PSA 0.50 02/27/2017   PSA 0.45 02/09/2015

## 2017-02-28 NOTE — Assessment & Plan Note (Addendum)
Changing valsartan to losartan 100 mg ,  Continue amlodipine AND HCTZ

## 2017-02-28 NOTE — Assessment & Plan Note (Signed)
He has RESUMED  Lipitor  And LDL is at goal  LFTs are normal. . Lab Results  Component Value Date   CHOL 124 02/27/2017   HDL 37.30 (L) 02/27/2017   LDLCALC 72 02/27/2017   LDLDIRECT 82 12/23/2014   TRIG 71.0 02/27/2017   CHOLHDL 3 02/27/2017   Lab Results  Component Value Date   ALT 21 02/27/2017   AST 16 02/27/2017   ALKPHOS 57 02/27/2017   BILITOT 0.6 02/27/2017

## 2017-02-28 NOTE — Assessment & Plan Note (Signed)
I have addressed  BMI and recommended wt loss of 10% of body weigh over the next 6 months by starting a regular exercisie program and continuining to follow a low glycemic index diet

## 2017-02-28 NOTE — Assessment & Plan Note (Signed)
Checking testosterone levels  Lab Results  Component Value Date   TESTOSTERONE 350 11/17/2012

## 2017-03-02 ENCOUNTER — Encounter: Payer: Self-pay | Admitting: Internal Medicine

## 2017-03-03 ENCOUNTER — Encounter: Payer: Self-pay | Admitting: Internal Medicine

## 2017-03-03 LAB — TESTOS,TOTAL,FREE AND SHBG (FEMALE)
Sex Hormone Binding Glob.: 33 nmol/L (ref 10–50)
Testosterone, Free: 53.7 pg/mL (ref 35.0–155.0)
Testosterone,Total,LC/MS/MS: 410 ng/dL (ref 250–1100)

## 2017-03-10 ENCOUNTER — Other Ambulatory Visit: Payer: Self-pay | Admitting: Internal Medicine

## 2017-04-30 NOTE — Telephone Encounter (Signed)
Error

## 2017-06-01 ENCOUNTER — Other Ambulatory Visit: Payer: Self-pay | Admitting: Internal Medicine

## 2017-06-22 ENCOUNTER — Other Ambulatory Visit: Payer: Self-pay | Admitting: Internal Medicine

## 2017-07-11 IMAGING — CR DG CHEST 2V
1 series · 2 of 2 positions shown · non-contrast
Comparison: Radiographs August 29, 2015.

CLINICAL DATA: Chest pain.

EXAM:
CHEST  2 VIEW

[Series 1: w chest pa · 0.14mm/px · 2 of 2 slices shown]
[im 1/2]
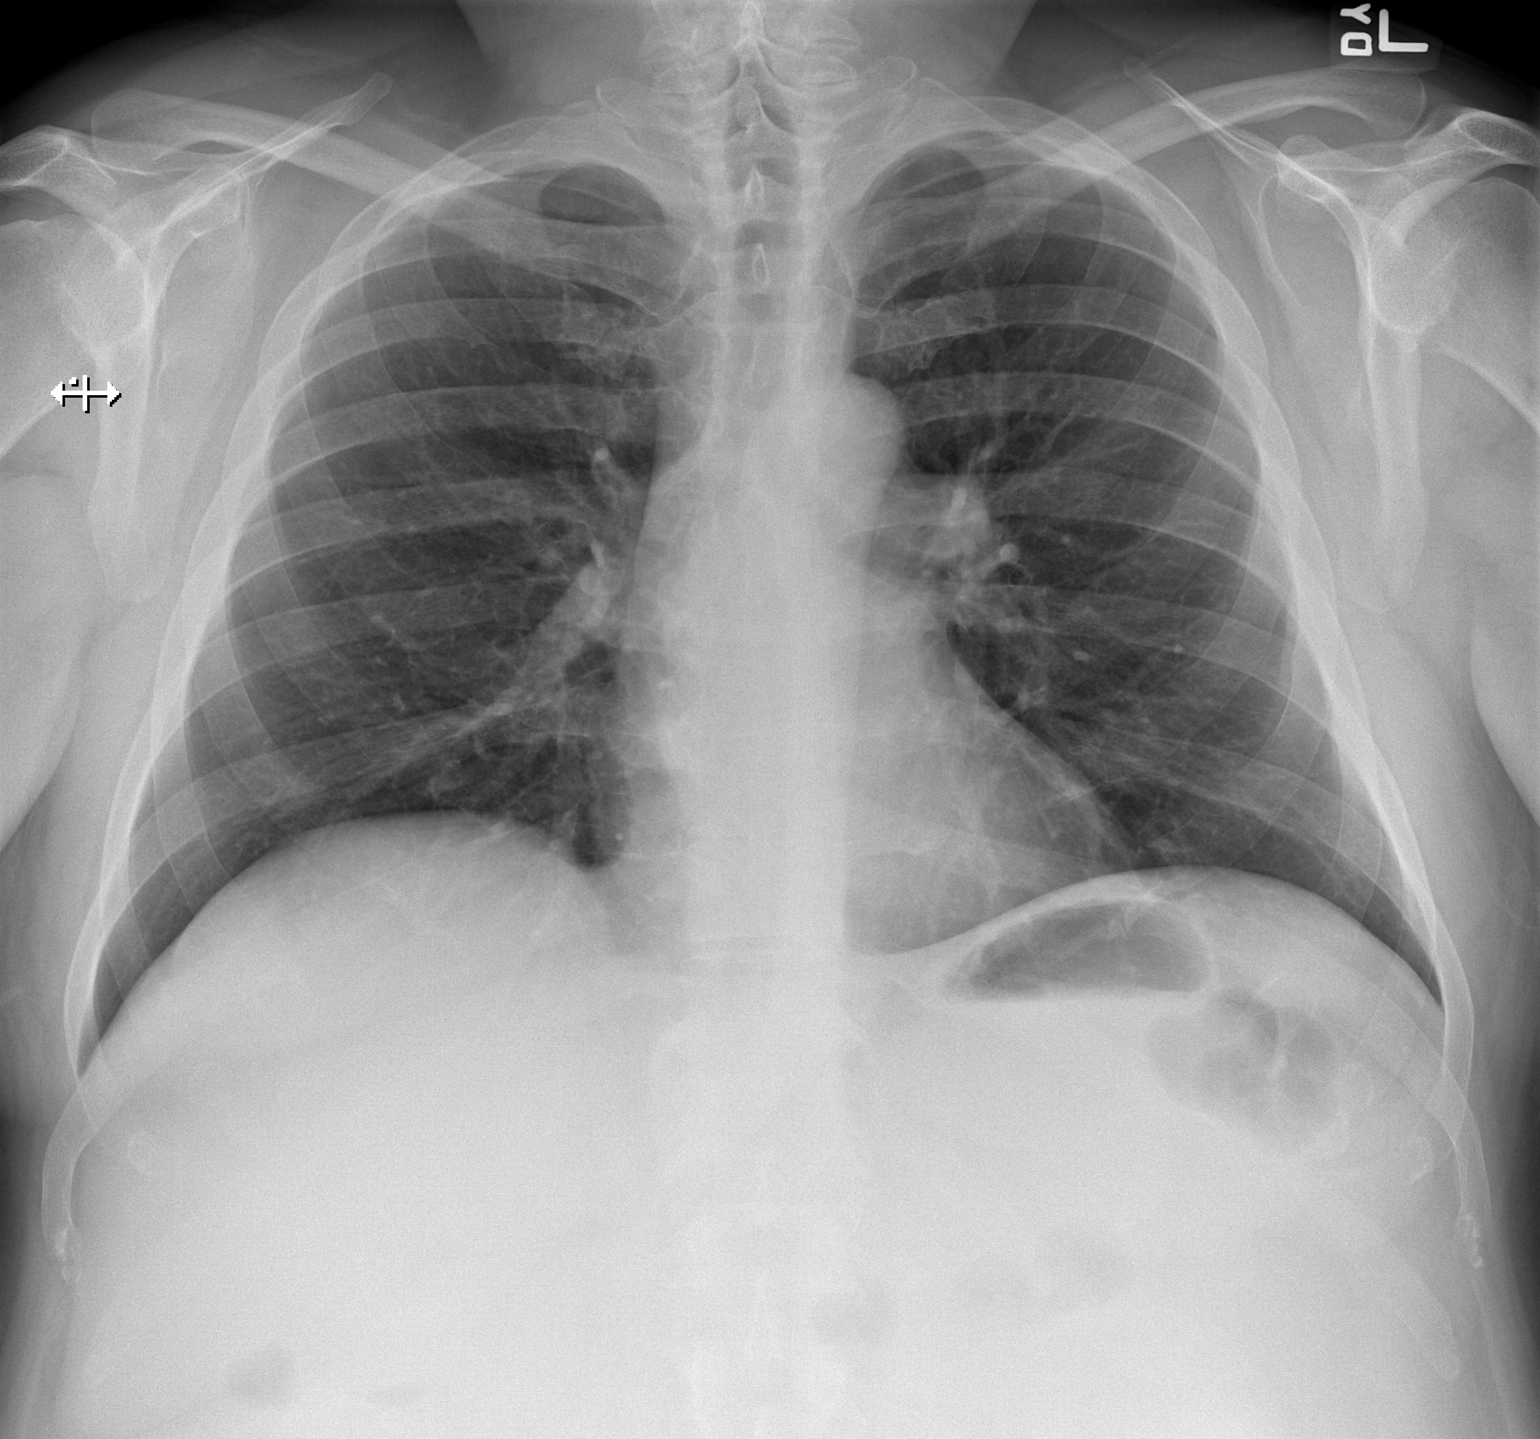
[im 2/2]
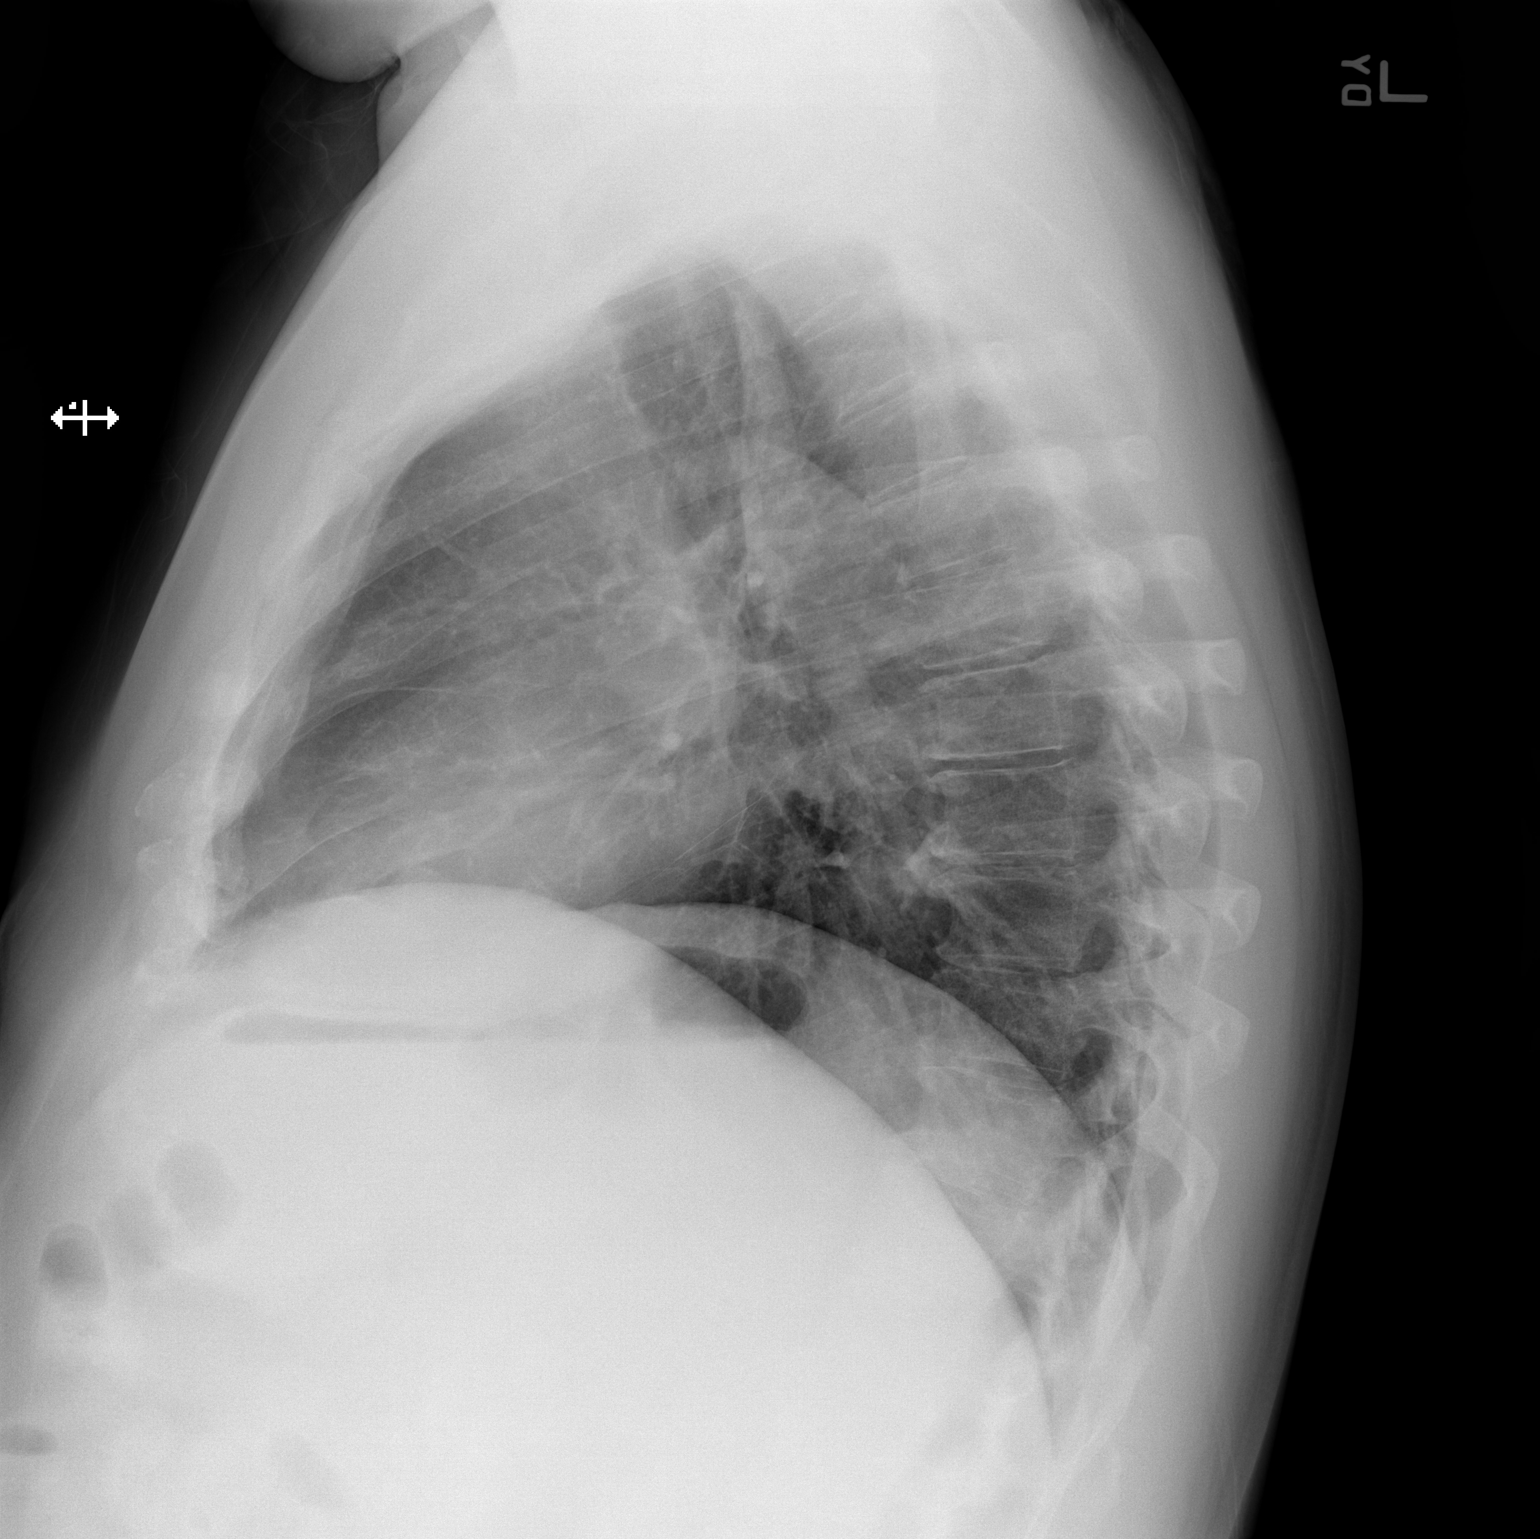

[2 of 2 positions shown; findings below may reference images not displayed]

FINDINGS: The heart size and mediastinal contours are within normal limits.
Both lungs are clear. No pneumothorax or pleural effusion is noted.
The visualized skeletal structures are unremarkable.
IMPRESSION: No active cardiopulmonary disease.

## 2017-08-31 ENCOUNTER — Other Ambulatory Visit: Payer: Self-pay | Admitting: Internal Medicine

## 2017-09-03 ENCOUNTER — Ambulatory Visit: Payer: BC Managed Care – PPO | Admitting: Internal Medicine

## 2017-09-03 ENCOUNTER — Encounter: Payer: Self-pay | Admitting: Internal Medicine

## 2017-09-03 VITALS — BP 140/82 | HR 94 | Temp 97.8°F | Resp 15 | Ht 70.0 in | Wt 228.0 lb

## 2017-09-03 DIAGNOSIS — R5383 Other fatigue: Secondary | ICD-10-CM

## 2017-09-03 DIAGNOSIS — E782 Mixed hyperlipidemia: Secondary | ICD-10-CM

## 2017-09-03 DIAGNOSIS — E669 Obesity, unspecified: Secondary | ICD-10-CM

## 2017-09-03 DIAGNOSIS — I10 Essential (primary) hypertension: Secondary | ICD-10-CM | POA: Diagnosis not present

## 2017-09-03 DIAGNOSIS — E119 Type 2 diabetes mellitus without complications: Secondary | ICD-10-CM

## 2017-09-03 MED ORDER — ZOSTER VAC RECOMB ADJUVANTED 50 MCG/0.5ML IM SUSR: 0.5000 mL | Freq: Once | INTRAMUSCULAR | 1 refills | Status: AC

## 2017-09-03 NOTE — Progress Notes (Signed)
Subjective:  Patient ID: Joshua Arellano, male    DOB: 12-Dec-1965  Age: 52 y.o. MRN: [id number]  CC: The primary encounter diagnosis was Mixed hyperlipidemia. Diagnoses of Diabetes mellitus without complication (HCC), Fatigue, unspecified type, Other fatigue, Obesity (BMI 30-39.9), and Essential hypertension were also pertinent to this visit.  HPI Joshua Arellano presents for 6 month follow up on   [facility]  Patient has no complaints today.  Patient is NOT following a low glycemic index diet but is  taking all prescribed medications regularly without side effects.  Fasting sugars have been under less than 140 most of the time and post prandials have been under 160 except on rare occasions. Patient is exercising about 3 times per week and intentionally trying to lose weight .  Patient has had an eye exam in the last 12 months and checks feet regularly for signs of infection.  Patient does not walk barefoot outside,  And denies an numbness tingling or burning in feet. Patient is up to date on all recommended vaccinations  Cc: fatigue   Pervasive.  Not sleepomg well, wakes up tired,  Tired all day. Has OSA but has stopped using CPAP, because it interferes with his wife's sleep (presumedly, per patient)    Outpatient Medications Prior to Visit  Medication Sig Dispense Refill  . amLODipine (NORVASC) 10 MG tablet TAKE 1 TABLET(10 MG) BY MOUTH DAILY 90 tablet 3  . aspirin 81 MG chewable tablet CHEW AND SWALLOW 1 TABLET BY MOUTH EVERY DAY 90 tablet 0  . atorvastatin (LIPITOR) 20 MG tablet TAKE 1 TABLET BY MOUTH EVERY DAY 90 tablet 2  . BAYER CONTOUR TEST test strip USE AS DIRECTED 150 each 1  . JARDIANCE 25 MG TABS tablet Take 1 tablet by mouth daily.  3  . losartan-hydrochlorothiazide (HYZAAR) 100-12.5 MG tablet TAKE 1 TABLET BY MOUTH DAILY 90 tablet 1  . metFORMIN (GLUCOPHAGE-XR) 500 MG 24 hr tablet Take 2,000 mg by mouth daily.  4  . Multiple Vitamins-Minerals  (MULTIVITAMIN WITH MINERALS) tablet Take 1 tablet by mouth daily.    . nitroGLYCERIN (NITROSTAT) 0.4 MG SL tablet Place 1 tablet (0.4 mg total) under the tongue every 5 (five) minutes as needed for chest pain. 50 tablet 3  . XULTOPHY 100-3.6 UNIT-MG/ML SOPN INJ 30 UNITS [place] D  11   No facility-administered medications prior to visit.     Review of Systems;  Patient denies headache, fevers, malaise, unintentional weight loss, skin rash, eye pain, sinus congestion and sinus pain, sore throat, dysphagia,  hemoptysis , cough, dyspnea, wheezing, chest pain, palpitations, orthopnea, edema, abdominal pain, nausea, melena, diarrhea, constipation, flank pain, dysuria, hematuria, urinary  Frequency, nocturia, numbness, tingling, seizures,  Focal weakness, Loss of consciousness,  Tremor, insomnia, depression, anxiety, and suicidal ideation.      Objective:  BP 140/82 (BP Location: Left Arm, Patient Position: Sitting, Cuff Size: Normal)   Pulse 94   Temp 97.8 F (36.6 C) (Oral)   Resp 15   Ht 5\' 10"  (1.778 m)   Wt 228 lb (103.4 kg)   SpO2 97%   BMI 32.71 kg/m   BP Readings from Last 3 Encounters:  09/03/17 140/82  02/26/17 (!) 152/78  06/03/16 134/70    Wt Readings from Last 3 Encounters:  09/03/17 228 lb (103.4 kg)  02/26/17 226 lb 3.2 oz (102.6 kg)  06/03/16 224 lb 8 oz (101.8 kg)    General appearance: alert, cooperative and appears stated age  Ears: normal TM's and external ear canals both ears Throat: lips, mucosa, and tongue normal; teeth and gums normal Neck: no adenopathy, no carotid bruit, supple, symmetrical, trachea midline and thyroid not enlarged, symmetric, no tenderness/mass/nodules Back: symmetric, no curvature. ROM normal. No CVA tenderness. Lungs: clear to auscultation bilaterally Heart: regular rate and rhythm, S1, S2 normal, no murmur, click, rub or gallop Abdomen: soft, non-tender; bowel sounds normal; no masses,  no organomegaly Pulses: 2+ and symmetric Skin:  Skin color, texture, turgor normal. No rashes or lesions Lymph nodes: Cervical, supraclavicular, and axillary nodes normal.     Lab Results  Component Value Date   HGBA1C 6.4 02/12/2017   HGBA1C 8.3 (H) 05/27/2016   HGBA1C 7.5 (H) 12/27/2015    Lab Results  Component Value Date   CREATININE 0.90 09/03/2017   CREATININE 0.97 02/27/2017   CREATININE 1.02 05/27/2016    Lab Results  Component Value Date   WBC 12.7 (H) 05/27/2016   HGB 15.9 05/27/2016   HCT 46.0 05/27/2016   PLT 307.0 05/27/2016   GLUCOSE 102 (H) 09/03/2017   CHOL 132 09/03/2017   TRIG 92.0 09/03/2017   HDL 39.70 09/03/2017   LDLDIRECT 82 12/23/2014   LDLCALC 74 09/03/2017   ALT 22 09/03/2017   AST 21 09/03/2017   NA 140 09/03/2017   K 4.0 09/03/2017   CL 103 09/03/2017   CREATININE 0.90 09/03/2017   BUN 12 09/03/2017   CO2 30 09/03/2017   TSH 0.64 08/29/2015   PSA 0.50 02/27/2017   HGBA1C 6.4 02/12/2017   MICROALBUR <0.7 09/03/2017    Nm Myocar Multi W/spect W/wall Motion / Ef  Result Date: 06/21/2016  Blood pressure demonstrated a hypertensive response to exercise. Excellent exercise capacity.  There was no ST segment deviation noted during stress.  The study is normal.  This is a low risk study.  The left ventricular ejection fraction is normal (55-65%).     Assessment & Plan:   Problem List Items Addressed This Visit    Diabetes mellitus without complication [facility])    He has not been seen by his endocrinologist at [facility] since July at which time a1c was 6.4.  He has been making little effort to follow a low GI diet or exercise since his medications have improved his glycemic control.  Encouraged low GI diet,  Weight loss and regular exercise.   Lab Results  Component Value Date   HGBA1C 6.4 02/12/2017   Lab Results  Component Value Date   MICROALBUR <0.7 09/03/2017         Relevant Orders   Microalbumin / creatinine urine ratio (Completed)   Hypertension    Well controlled on  current regimen. Renal function stable, no changes today.  Lab Results  Component Value Date   CREATININE 0.90 09/03/2017   Lab Results  Component Value Date   NA 140 09/03/2017   K 4.0 09/03/2017   CL 103 09/03/2017   CO2 30 09/03/2017         Obesity (BMI 30-39.9)    I have addressed  BMI and recommended wt loss of 10% of body weigh over the next 6 months by starting a regular exercise program and resuming adherence to a low glycemic index diet       Other fatigue    Due to untreated OSA.  diagnosed with prior sleep studyDiscussed the long term history of OSA , the risks of long term damage to heart and the signs and symptoms attributable to OSA.  Advised patient to consider  significant weight loss and/or use of CPAP.        Other Visit Diagnoses    Mixed hyperlipidemia    -  Primary   Relevant Orders   Lipid panel (Completed)   Fatigue, unspecified type       Relevant Orders   Comprehensive metabolic panel (Completed)   B12 (Completed)      I am having Joshua Murdoch start on Zoster Vaccine Adjuvanted. I am also having him maintain his multivitamin with minerals, BAYER CONTOUR TEST, metFORMIN, atorvastatin, JARDIANCE, amLODipine, nitroGLYCERIN, XULTOPHY, aspirin, and losartan-hydrochlorothiazide.  Meds ordered this encounter  Medications  . Zoster Vaccine Adjuvanted [facility]) injection    Sig: Inject 0.5 mLs into the muscle once for 1 dose.    Dispense:  1 each    Refill:  1  A total of 25 minutes of face to face time was spent with patient , more than half of which was spent in counselling about the above mentioned conditions  and coordination of care  There are no discontinued medications.  Follow-up: Return in about 6 months (around 03/03/2018) for CPE .   Sherlene Shams, MD

## 2017-09-03 NOTE — Patient Instructions (Signed)

## 2017-09-04 LAB — MICROALBUMIN / CREATININE URINE RATIO
CREATININE, U: 22 mg/dL
Microalb Creat Ratio: 3.2 mg/g (ref 0.0–30.0)

## 2017-09-04 LAB — COMPREHENSIVE METABOLIC PANEL
ALBUMIN: 4.3 g/dL (ref 3.5–5.2)
ALT: 22 U/L (ref 0–53)
AST: 21 U/L (ref 0–37)
Alkaline Phosphatase: 59 U/L (ref 39–117)
BUN: 12 mg/dL (ref 6–23)
CALCIUM: 10.1 mg/dL (ref 8.4–10.5)
CHLORIDE: 103 meq/L (ref 96–112)
CO2: 30 meq/L (ref 19–32)
CREATININE: 0.9 mg/dL (ref 0.40–1.50)
GFR: 93.62 mL/min (ref >60.00)
Glucose, Bld: 102 mg/dL — ABNORMAL HIGH (ref 70–99)
POTASSIUM: 4 meq/L (ref 3.5–5.1)
SODIUM: 140 meq/L (ref 135–145)
Total Bilirubin: 0.4 mg/dL (ref 0.2–1.2)
Total Protein: 7.1 g/dL (ref 6.0–8.3)

## 2017-09-04 LAB — LIPID PANEL
CHOLESTEROL: 132 mg/dL (ref 0–200)
HDL: 39.7 mg/dL (ref >39.00)
LDL Cholesterol: 74 mg/dL (ref 0–99)
NONHDL: 92.7
Total CHOL/HDL Ratio: 3
Triglycerides: 92 mg/dL (ref 0.0–149.0)
VLDL: 18.4 mg/dL (ref 0.0–40.0)

## 2017-09-04 LAB — VITAMIN B12: VITAMIN B 12: 489 pg/mL (ref 211–911)

## 2017-09-06 ENCOUNTER — Encounter: Payer: Self-pay | Admitting: Internal Medicine

## 2017-09-06 NOTE — Assessment & Plan Note (Signed)
Well controlled on current regimen. Renal function stable, no changes today.  Lab Results  Component Value Date   CREATININE 0.90 09/03/2017   Lab Results  Component Value Date   NA 140 09/03/2017   K 4.0 09/03/2017   CL 103 09/03/2017   CO2 30 09/03/2017

## 2017-09-06 NOTE — Assessment & Plan Note (Addendum)
I have addressed  BMI and recommended wt loss of 10% of body weigh over the next 6 months by starting a regular exercise program and resuming adherence to a low glycemic index diet

## 2017-09-06 NOTE — Assessment & Plan Note (Signed)
Due to untreated OSA.  diagnosed with prior sleep studyDiscussed the long term history of OSA , the risks of long term damage to heart and the signs and symptoms attributable to OSA.  Advised patient to consider  significant weight loss and/or use of CPAP.

## 2017-09-06 NOTE — Assessment & Plan Note (Signed)
He has [facility] since July at which time a1c was 6.4.  He has been making little effort to follow a low GI diet or exercise since his medications have improved his glycemic control.  Encouraged low GI diet,  Weight loss and regular exercise.   Lab Results  Component Value Date   HGBA1C 6.4 02/12/2017   Lab Results  Component Value Date   MICROALBUR <0.7 09/03/2017

## 2017-09-21 ENCOUNTER — Other Ambulatory Visit: Payer: Self-pay | Admitting: Internal Medicine

## 2017-11-14 ENCOUNTER — Encounter: Payer: Self-pay | Admitting: Internal Medicine

## 2018-01-05 ENCOUNTER — Other Ambulatory Visit: Payer: Self-pay | Admitting: Internal Medicine

## 2018-02-01 LAB — HEMOGLOBIN A1C: HEMOGLOBIN A1C: 6.8

## 2018-02-23 LAB — HM DIABETES EYE EXAM

## 2018-03-03 ENCOUNTER — Ambulatory Visit (INDEPENDENT_AMBULATORY_CARE_PROVIDER_SITE_OTHER): Payer: BC Managed Care – PPO | Admitting: Internal Medicine

## 2018-03-03 ENCOUNTER — Encounter: Payer: Self-pay | Admitting: Internal Medicine

## 2018-03-03 VITALS — BP 140/82 | HR 96 | Temp 98.2°F | Resp 16 | Ht 70.0 in | Wt 230.4 lb

## 2018-03-03 DIAGNOSIS — R5383 Other fatigue: Secondary | ICD-10-CM | POA: Diagnosis not present

## 2018-03-03 DIAGNOSIS — E669 Obesity, unspecified: Secondary | ICD-10-CM

## 2018-03-03 DIAGNOSIS — I10 Essential (primary) hypertension: Secondary | ICD-10-CM

## 2018-03-03 DIAGNOSIS — Z0000 Encounter for general adult medical examination without abnormal findings: Secondary | ICD-10-CM | POA: Diagnosis not present

## 2018-03-03 DIAGNOSIS — Z79899 Other long term (current) drug therapy: Secondary | ICD-10-CM

## 2018-03-03 DIAGNOSIS — Z125 Encounter for screening for malignant neoplasm of prostate: Secondary | ICD-10-CM

## 2018-03-03 DIAGNOSIS — E119 Type 2 diabetes mellitus without complications: Secondary | ICD-10-CM

## 2018-03-03 MED ORDER — LOSARTAN POTASSIUM-HCTZ 100-25 MG PO TABS: 1.0000 | ORAL_TABLET | Freq: Every day | ORAL | 3 refills | Status: DC

## 2018-03-03 MED ORDER — NYSTATIN 100000 UNIT/GM EX POWD: Freq: Two times a day (BID) | CUTANEOUS | 0 refills | Status: DC

## 2018-03-03 MED ORDER — XULTOPHY 100-3.6 UNIT-MG/ML ~~LOC~~ SOPN: 34.0000 [IU] | PEN_INJECTOR | Freq: Every day | SUBCUTANEOUS | 3 refills | Status: DC

## 2018-03-03 MED ORDER — JARDIANCE 25 MG PO TABS: 25.0000 mg | ORAL_TABLET | Freq: Every day | ORAL | 0 refills | Status: DC

## 2018-03-03 MED ORDER — METFORMIN HCL ER 500 MG PO TB24: 2000.0000 mg | ORAL_TABLET | Freq: Every day | ORAL | 0 refills | Status: DC

## 2018-03-03 NOTE — Patient Instructions (Addendum)
I have refilled the jardiance,   Xultophy and metformin  I am changing  The  losartan hctz to a stronger dose  For goal bp of 120/70  Continue amlodipine as well   Try reducing the metformin to 1000 mg daily ,  Or taking the full dose at home at night.     Health Maintenance, Male A healthy lifestyle and preventive care is important for your health and wellness. Ask your health care provider about what schedule of regular examinations is right for you. What should I know about weight and diet? Eat a Healthy Diet  Eat plenty of vegetables, fruits, whole grains, low-fat dairy products, and lean protein.  Do not eat a lot of foods high in solid fats, added sugars, or salt.  Maintain a Healthy Weight Regular exercise can help you achieve or maintain a healthy weight. You should:  Do at least 150 minutes of exercise each week. The exercise should increase your heart rate and make you sweat (moderate-intensity exercise).  Do strength-training exercises at least twice a week.  Watch Your Levels of Cholesterol and Blood Lipids  Have your blood tested for lipids and cholesterol every 5 years starting at 54 years of age. If you are at high risk for heart disease, you should start having your blood tested when you are 54 years old. You may need to have your cholesterol levels checked more often if: ? Your lipid or cholesterol levels are high. ? You are older than 54 years of age. ? You are at high risk for heart disease.  What should I know about cancer screening? Many types of cancers can be detected early and may often be prevented. Lung Cancer  You should be screened every year for lung cancer if: ? You are a current smoker who has smoked for at least 30 years. ? You are a former smoker who has quit within the past 15 years.  Talk to your health care provider about your screening options, when you should start screening, and how often you should be screened.  Colorectal  Cancer  Routine colorectal cancer screening usually begins at 54 years of age and should be repeated every 5-10 years until you are 54 years old. You may need to be screened more often if early forms of precancerous polyps or small growths are found. Your health care provider may recommend screening at an earlier age if you have risk factors for colon cancer.  Your health care provider may recommend using home test kits to check for hidden blood in the stool.  A small camera at the end of a tube can be used to examine your colon (sigmoidoscopy or colonoscopy). This checks for the earliest forms of colorectal cancer.  Prostate and Testicular Cancer  Depending on your age and overall health, your health care provider may do certain tests to screen for prostate and testicular cancer.  Talk to your health care provider about any symptoms or concerns you have about testicular or prostate cancer.  Skin Cancer  Check your skin from head to toe regularly.  Tell your health care provider about any new moles or changes in moles, especially if: ? There is a change in a mole's size, shape, or color. ? You have a mole that is larger than a pencil eraser.  Always use sunscreen. Apply sunscreen liberally and repeat throughout the day.  Protect yourself by wearing long sleeves, pants, a wide-brimmed hat, and sunglasses when outside.  What should I know  about heart disease, diabetes, and high blood pressure?  If you are 97-51 years of age, have your blood pressure checked every 3-5 years. If you are 66 years of age or older, have your blood pressure checked every year. You should have your blood pressure measured twice-once when you are at a hospital or clinic, and once when you are not at a hospital or clinic. Record the average of the two measurements. To check your blood pressure when you are not at a hospital or clinic, you can use: ? An automated blood pressure machine at a pharmacy. ? A home blood  pressure monitor.  Talk to your health care provider about your target blood pressure.  If you are between 20-64 years old, ask your health care provider if you should take aspirin to prevent heart disease.  Have regular diabetes screenings by checking your fasting blood sugar level. ? If you are at a normal weight and have a low risk for diabetes, have this test once every three years after the age of 55. ? If you are overweight and have a high risk for diabetes, consider being tested at a younger age or more often.  A one-time screening for abdominal aortic aneurysm (AAA) by ultrasound is recommended for men aged 49-75 years who are current or former smokers. What should I know about preventing infection? Hepatitis B If you have a higher risk for hepatitis B, you should be screened for this virus. Talk with your health care provider to find out if you are at risk for hepatitis B infection. Hepatitis C Blood testing is recommended for:  Everyone born from [birth year] through 12/19/1965.  Anyone with known risk factors for hepatitis C.  Sexually Transmitted Diseases (STDs)  You should be screened each year for STDs including gonorrhea and chlamydia if: ? You are sexually active and are younger than 54 years of age. ? You are older than 54 years of age and your health care provider tells you that you are at risk for this type of infection. ? Your sexual activity has changed since you were last screened and you are at an increased risk for chlamydia or gonorrhea. Ask your health care provider if you are at risk.  Talk with your health care provider about whether you are at high risk of being infected with HIV. Your health care provider may recommend a prescription medicine to help prevent HIV infection.  What else can I do?  Schedule regular health, dental, and eye exams.  Stay current with your vaccines (immunizations).  Do not use any tobacco products, such as cigarettes, chewing tobacco, and  e-cigarettes. If you need help quitting, ask your health care provider.  Limit alcohol intake to no more than 2 drinks per day. One drink equals 12 ounces of beer, 5 ounces of wine, or 1 ounces of hard liquor.  Do not use street drugs.  Do not share needles.  Ask your health care provider for help if you need support or information about quitting drugs.  Tell your health care provider if you often feel depressed.  Tell your health care provider if you have ever been abused or do not feel safe at home. This information is not intended to replace advice given to you by your health care provider. Make sure you discuss any questions you have with your health care provider. Document Released: 01/18/2008 Document Revised: 03/20/2016 Document Reviewed: 04/25/2015 Elsevier Interactive Patient Education  [employer].

## 2018-03-03 NOTE — Progress Notes (Addendum)
Patient ID: Joshua Arellano, male    DOB: 1965-12-30  Age: 52 y.o. MRN: [id number]  The patient is here for annual PREVENTIVE examination and management of other chronic and acute problems.  NEEDS HEP C HIV SCREEN [facility] .  Not fasting today . Sees dermatology once a year     The risk factors are reflected in the social history.  The roster of all physicians providing medical care to patient - is listed in the Snapshot section of the chart.  Activities of daily living:  The patient is 100% independent in all ADLs: dressing, toileting, feeding as well as independent mobility  Home safety : The patient has smoke detectors in the home. They wear seatbelts.  There are no firearms at home. There is no violence in the home.   There is no risks for hepatitis, STDs or HIV. There is no   history of blood transfusion. They have no travel history to infectious disease endemic areas of the world.  The patient has seen their dentist in the last six month. They have seen their eye doctor in the last year. They deny difficulty with regard to whispered voices and some television programs.  They have deferred audiologic testing in the last year.  They do not  have excessive sun exposure. Discussed the need for sun protection: hats, long sleeves and use of sunscreen if there is significant sun exposure.   Diet: the importance of a healthy diet is discussed. They do not  have a healthy diet. He eats a [employer] biscuit every morning for breakfast   The benefits of regular aerobic exercise were discussed. He is not exercising regularly.   Depression screen: there are no signs or vegative symptoms of depression- irritability, change in appetite, anhedonia, sadness/tearfullness.   The following portions of the patient's history were reviewed and updated as appropriate: allergies, current medications, past family history, past medical history,  past surgical history,  past social history  and problem list.  Visual acuity was not assessed per patient preference since she has regular follow up with her ophthalmologist. Hearing and body mass index were assessed and reviewed.   During the course of the visit the patient was educated and counseled about appropriate screening and preventive services including : fall prevention , diabetes screening, nutrition counseling, colorectal cancer screening, and recommended immunizations.    CC: The primary encounter diagnosis was Encounter for preventive health examination. Diagnoses of Fatigue, unspecified type, Long-term use of high-risk medication, Prostate cancer screening, Diabetes mellitus without complication (HCC), Obesity (BMI 30-39.9), and Essential hypertension were also pertinent to this visit.  Had heartburn 2 weeks ago after eating a [place] meal.  So he started taking omeprazole.  Since then he has had no recurrence     [name] has a past medical history of Diabetes mellitus, Hemorrhoid, Hemorrhoids, Hyperlipidemia, Hypertension, Knee pain, left, Obesity (BMI 30-39.9), and Sleep apnea.   He has a past surgical history that includes Tonsillectomy (1972); Anoscopy (10 years ago); and Colonoscopy with propofol (N/A, 02/28/2016).   His family history includes Cancer in his maternal uncle; Heart attack in his maternal grandfather; Heart disease in his maternal grandfather; Hypertension in his maternal grandfather, paternal grandfather, and paternal grandmother; Transient ischemic attack in his father.He reports that he has never smoked. He has never used smokeless tobacco. He reports that he drinks alcohol. He reports that he does not use drugs.  Outpatient Medications Prior to Visit  Medication Sig Dispense  Refill  . amLODipine (NORVASC) 10 MG tablet TAKE 1 TABLET(10 MG) BY MOUTH DAILY 90 tablet 3  . aspirin 81 MG chewable tablet CHEW AND SWALLOW 1 TABLET BY MOUTH EVERY DAY 90 tablet 1  . atorvastatin  (LIPITOR) 20 MG tablet TAKE 1 TABLET BY MOUTH EVERY DAY 90 tablet 2  . Continuous Blood Gluc Sensor (FREESTYLE LIBRE 14 DAY SENSOR) MISC by Other route every fourteen (14) days.    . Continuous Blood Gluc Sensor (FREESTYLE LIBRE SENSOR SYSTEM) MISC USE AS DIRECTED. CHANGE EVERY 10 DAYS    . ketoconazole (NIZORAL) 2 % shampoo   5  . Multiple Vitamins-Minerals (MULTIVITAMIN WITH MINERALS) tablet Take 1 tablet by mouth daily.    . nitroGLYCERIN (NITROSTAT) 0.4 MG SL tablet Place 1 tablet (0.4 mg total) under the tongue every 5 (five) minutes as needed for chest pain. 50 tablet 3  . JARDIANCE 25 MG TABS tablet Take 1 tablet by mouth daily.  3  . losartan-hydrochlorothiazide (HYZAAR) 100-12.5 MG tablet TAKE 1 TABLET BY MOUTH DAILY 90 tablet 1  . metFORMIN (GLUCOPHAGE-XR) 500 MG 24 hr tablet Take 2,000 mg by mouth daily.  4  . XULTOPHY 100-3.6 UNIT-MG/ML SOPN INJ 30 UNITS  D  11  . BAYER CONTOUR TEST test strip USE AS DIRECTED (Patient not taking: Reported on 03/03/2018) 150 each 1   No facility-administered medications prior to visit.     Review of Systems   Patient denies headache, fevers, malaise, unintentional weight loss, skin rash, eye pain, sinus congestion and sinus pain, sore throat, dysphagia,  hemoptysis , cough, dyspnea, wheezing, chest pain, palpitations, orthopnea, edema, abdominal pain, nausea, melena, diarrhea, constipation, flank pain, dysuria, hematuria, urinary  Frequency, nocturia, numbness, tingling, seizures,  Focal weakness, Loss of consciousness,  Tremor, insomnia, depression, anxiety, and suicidal ideation.      Objective:  BP 140/82 (BP Location: Left Arm, Patient Position: Sitting, Cuff Size: Normal)   Pulse 96   Temp 98.2 F (36.8 C) (Oral)   Resp 16   Ht 5\' 10"  (1.778 m)   Wt 230 lb 6.4 oz (104.5 kg)   SpO2 97%   BMI 33.06 kg/m   Physical Exam  General appearance: alert, cooperative and appears stated age Ears: normal TM's and external ear canals both  ears Throat: lips, mucosa, and tongue normal; teeth and gums normal Neck: no adenopathy, no carotid bruit, supple, symmetrical, trachea midline and thyroid not enlarged, symmetric, no tenderness/mass/nodules Back: symmetric, no curvature. ROM normal. No CVA tenderness. Lungs: clear to auscultation bilaterally Heart: regular rate and rhythm, S1, S2 normal, no murmur, click, rub or gallop Abdomen: soft, non-tender; bowel sounds normal; no masses,  no organomegaly Pulses: 2+ and symmetric Skin: Skin color, texture, turgor normal. No rashes or lesions Lymph nodes: Cervical, supraclavicular, and axillary nodes normal.  Assessment & Plan:   Problem List Items Addressed This Visit    Prostate cancer screening   Relevant Orders   PSA (Completed)   Obesity (BMI 30-39.9)    I have addressed  BMI and recommended wt loss of 10% of body weight over the next 6 months using a low glycemic index diet and regular exercise a minimum of 5 days per week.        Relevant Medications   JARDIANCE 25 MG TABS tablet   XULTOPHY 100-3.6 UNIT-MG/ML SOPN   metFORMIN (GLUCOPHAGE-XR) 500 MG 24 hr tablet   Hypertension    Not at goal  on current regimen. Renal function stable, losartan dose changed  today .  Lab Results  Component Value Date   CREATININE 0.94 03/03/2018   Lab Results  Component Value Date   NA 140 03/03/2018   K 4.2 03/03/2018   CL 102 03/03/2018   CO2 29 03/03/2018         Relevant Medications   losartan-hydrochlorothiazide (HYZAAR) 100-25 MG tablet   Encounter for preventive health examination - Primary    Annual comprehensive preventive exam was done as well as an evaluation and management of acute and chronic conditions .  During the course of the visit the patient was educated and counseled about appropriate screening and preventive services including :  diabetes screening, lipid analysis with projected  10 year  risk for CAD , nutrition counseling, prostate and colorectal cancer  screening, and recommended immunizations.  Printed recommendations for health maintenance screenings was given.   Lab Results  Component Value Date   PSA 0.50 03/03/2018   PSA 0.50 02/27/2017   PSA 0.45 02/09/2015         Diabetes mellitus without complication [facility])    Managed by his endocrinologist at [facility]  With most recent  a1c was 6.8.   He has been making little effort to follow a low GI diet or exercise since his medications have improved his glycemic control.  Encouraged low GI diet,  Weight loss and regular exercise.   Refills on medications given. Advised to lower metformin dose to 1000 mg daily given loose stools.   Lab Results  Component Value Date   HGBA1C 6.8 02/01/2018   Lab Results  Component Value Date   MICROALBUR <0.7 09/03/2017         Relevant Medications   JARDIANCE 25 MG TABS tablet   XULTOPHY 100-3.6 UNIT-MG/ML SOPN   metFORMIN (GLUCOPHAGE-XR) 500 MG 24 hr tablet   losartan-hydrochlorothiazide (HYZAAR) 100-25 MG tablet   Other Relevant Orders   Lipid panel   Comprehensive metabolic panel    Other Visit Diagnoses    Fatigue, unspecified type       Relevant Orders   Hepatitis C antibody (Completed)   HIV antibody (Completed)   Long-term use of high-risk medication       Relevant Orders   Comprehensive metabolic panel (Completed)      I have discontinued Joshua Na C. Bugge's BAYER CONTOUR TEST and losartan-hydrochlorothiazide. I have also changed his JARDIANCE, XULTOPHY, and metFORMIN. Additionally, I am having him start on losartan-hydrochlorothiazide and nystatin. Lastly, I am having him maintain his multivitamin with minerals, atorvastatin, amLODipine, nitroGLYCERIN, aspirin, FREESTYLE LIBRE SENSOR SYSTEM, FREESTYLE LIBRE 14 DAY SENSOR, and ketoconazole.  Meds ordered this encounter  Medications  . JARDIANCE 25 MG TABS tablet    Sig: Take 25 mg by mouth daily.    Dispense:  90 tablet    Refill:  0  . XULTOPHY 100-3.6 UNIT-MG/ML SOPN    Sig:  Inject 34 Units into the skin daily.    Dispense:  10 pen    Refill:  3  . metFORMIN (GLUCOPHAGE-XR) 500 MG 24 hr tablet    Sig: Take 4 tablets (2,000 mg total) by mouth daily.    Dispense:  360 tablet    Refill:  0  . losartan-hydrochlorothiazide (HYZAAR) 100-25 MG tablet    Sig: Take 1 tablet by mouth daily.    Dispense:  90 tablet    Refill:  3  . nystatin (NYSTATIN) powder    Sig: Apply topically 2 (two) times daily.    Dispense:  15 g  Refill:  0    Medications Discontinued During This Encounter  Medication Reason  . BAYER CONTOUR TEST test strip Change in therapy  . JARDIANCE 25 MG TABS tablet Reorder  . XULTOPHY 100-3.6 UNIT-MG/ML SOPN Reorder  . metFORMIN (GLUCOPHAGE-XR) 500 MG 24 hr tablet Reorder  . losartan-hydrochlorothiazide (HYZAAR) 100-12.5 MG tablet     Follow-up: Return in about 6 months (around 09/03/2018).   Sherlene Shams, MD

## 2018-03-04 LAB — COMPREHENSIVE METABOLIC PANEL
ALT: 24 U/L (ref 0–53)
AST: 17 U/L (ref 0–37)
Albumin: 4.3 g/dL (ref 3.5–5.2)
Alkaline Phosphatase: 78 U/L (ref 39–117)
BUN: 7 mg/dL (ref 6–23)
CHLORIDE: 102 meq/L (ref 96–112)
CO2: 29 meq/L (ref 19–32)
Calcium: 10.6 mg/dL — ABNORMAL HIGH (ref 8.4–10.5)
Creatinine, Ser: 0.94 mg/dL (ref 0.40–1.50)
GFR: 88.88 mL/min (ref >60.00)
Glucose, Bld: 160 mg/dL — ABNORMAL HIGH (ref 70–99)
POTASSIUM: 4.2 meq/L (ref 3.5–5.1)
Sodium: 140 mEq/L (ref 135–145)
Total Bilirubin: 0.5 mg/dL (ref 0.2–1.2)
Total Protein: 6.9 g/dL (ref 6.0–8.3)

## 2018-03-04 LAB — HIV ANTIBODY (ROUTINE TESTING W REFLEX): HIV: NONREACTIVE

## 2018-03-04 LAB — PSA: PSA: 0.5 ng/mL (ref 0.10–4.00)

## 2018-03-04 LAB — HEPATITIS C ANTIBODY
Hepatitis C Ab: NONREACTIVE
SIGNAL TO CUT-OFF: 0.01 (ref <1.00)

## 2018-03-04 NOTE — Assessment & Plan Note (Addendum)
Not at goal  on current regimen. Renal function stable, losartan dose changed today .  Lab Results  Component Value Date   CREATININE 0.94 03/03/2018   Lab Results  Component Value Date   NA 140 03/03/2018   K 4.2 03/03/2018   CL 102 03/03/2018   CO2 29 03/03/2018

## 2018-03-04 NOTE — Assessment & Plan Note (Signed)
I have addressed  BMI and recommended wt loss of 10% of body weight over the next 6 months using a low glycemic index diet and regular exercise a minimum of 5 days per week.   

## 2018-03-04 NOTE — Assessment & Plan Note (Signed)
Annual comprehensive preventive exam was done as well as an evaluation and management of acute and chronic conditions .  During the course of the visit the patient was educated and counseled about appropriate screening and preventive services including :  diabetes screening, lipid analysis with projected  10 year  risk for CAD , nutrition counseling, prostate and colorectal cancer screening, and recommended immunizations.  Printed recommendations for health maintenance screenings was given.   Lab Results  Component Value Date   PSA 0.50 03/03/2018   PSA 0.50 02/27/2017   PSA 0.45 02/09/2015

## 2018-03-04 NOTE — Assessment & Plan Note (Addendum)
[facility]  With most recent  a1c was 6.8.   He has been making little effort to follow a low GI diet or exercise since his medications have improved his glycemic control.  Encouraged low GI diet,  Weight loss and regular exercise.   Refills on medications given. Advised to lower metformin dose to 1000 mg daily given loose stools.   Lab Results  Component Value Date   HGBA1C 6.8 02/01/2018   Lab Results  Component Value Date   MICROALBUR <0.7 09/03/2017

## 2018-03-05 NOTE — Addendum Note (Signed)
Addended by: Sherlene ShamsULLO, Skye Rodarte L on: 03/05/2018 10:26 AM   Modules accepted: Orders

## 2018-03-09 ENCOUNTER — Telehealth: Payer: Self-pay | Admitting: Internal Medicine

## 2018-03-09 ENCOUNTER — Other Ambulatory Visit: Payer: Self-pay | Admitting: *Deleted

## 2018-03-09 DIAGNOSIS — E119 Type 2 diabetes mellitus without complications: Secondary | ICD-10-CM

## 2018-03-09 MED ORDER — ATORVASTATIN CALCIUM 20 MG PO TABS: ORAL_TABLET | ORAL | 2 refills | Status: DC

## 2018-03-09 NOTE — Telephone Encounter (Signed)
Copied from CRM [phone]. Topic: Quick Communication - Rx Refill/Question >> Mar 09, 2018 10:07 AM [name], Joshua B wrote: Medication: atorvastatin (LIPITOR) 20 MG tablet [[id number]][[id number]]   Has the patient contacted their pharmacy? Yes.   (Agent: If no, request that the patient contact the pharmacy for the refill.) (Agent: If yes, when and what did the pharmacy advise?)  Preferred Pharmacy (with phone number or street name): [facility]  Agent: Please be advised that RX refills may take up to 3 business days. We ask that you follow-up with your pharmacy.

## 2018-03-23 ENCOUNTER — Other Ambulatory Visit: Payer: Self-pay | Admitting: Internal Medicine

## 2018-06-28 ENCOUNTER — Other Ambulatory Visit: Payer: Self-pay | Admitting: Internal Medicine

## 2018-08-17 LAB — HEMOGLOBIN A1C: Hemoglobin A1C: 7.2

## 2018-08-24 ENCOUNTER — Encounter: Payer: Self-pay | Admitting: Internal Medicine

## 2018-08-24 ENCOUNTER — Ambulatory Visit: Payer: BC Managed Care – PPO | Admitting: Internal Medicine

## 2018-08-24 VITALS — BP 120/76 | HR 88 | Temp 97.8°F | Resp 15 | Ht 70.0 in | Wt 214.2 lb

## 2018-08-24 DIAGNOSIS — I10 Essential (primary) hypertension: Secondary | ICD-10-CM

## 2018-08-24 DIAGNOSIS — E119 Type 2 diabetes mellitus without complications: Secondary | ICD-10-CM

## 2018-08-24 DIAGNOSIS — E669 Obesity, unspecified: Secondary | ICD-10-CM

## 2018-08-24 DIAGNOSIS — E785 Hyperlipidemia, unspecified: Secondary | ICD-10-CM

## 2018-08-24 LAB — LIPID PANEL
Cholesterol: 153 mg/dL (ref 0–200)
HDL: 42.8 mg/dL (ref >39.00)
LDL CALC: 99 mg/dL (ref 0–99)
NONHDL: 110.32
Total CHOL/HDL Ratio: 4
Triglycerides: 55 mg/dL (ref 0.0–149.0)
VLDL: 11 mg/dL (ref 0.0–40.0)

## 2018-08-24 LAB — MICROALBUMIN / CREATININE URINE RATIO
CREATININE, U: 23.9 mg/dL
Microalb Creat Ratio: 2.9 mg/g (ref 0.0–30.0)
Microalb, Ur: <0.7 mg/dL (ref 0.0–1.9)

## 2018-08-24 LAB — COMPREHENSIVE METABOLIC PANEL
ALT: 21 U/L (ref 0–53)
AST: 18 U/L (ref 0–37)
Albumin: 4.4 g/dL (ref 3.5–5.2)
Alkaline Phosphatase: 69 U/L (ref 39–117)
BILIRUBIN TOTAL: 0.4 mg/dL (ref 0.2–1.2)
BUN: 10 mg/dL (ref 6–23)
CALCIUM: 10.5 mg/dL (ref 8.4–10.5)
CHLORIDE: 102 meq/L (ref 96–112)
CO2: 28 meq/L (ref 19–32)
CREATININE: 0.95 mg/dL (ref 0.40–1.50)
GFR: 82.46 mL/min (ref >60.00)
GLUCOSE: 137 mg/dL — AB (ref 70–99)
Potassium: 4.3 mEq/L (ref 3.5–5.1)
SODIUM: 138 meq/L (ref 135–145)
Total Protein: 7.1 g/dL (ref 6.0–8.3)

## 2018-08-24 NOTE — Progress Notes (Signed)
Subjective:  Patient ID: Joshua Arellano, male    DOB: 1966-05-08  Age: 53 y.o. MRN: [id number]  CC: The primary encounter diagnosis was Dyslipidemia, goal LDL below 70. Diagnoses of Diabetes mellitus without complication (HCC), Obesity (BMI 30-39.9), and Essential hypertension were also pertinent to this visit.  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  .Endocrinologist wants him  to stay under 125 gm of carbs per night .  Last a1c 7.2 last week   Up from 6.8 in August  Using the freestyle libre  Average BS has improved ,  insulin dose is  17 units once daily  Of Xultrophy (combination of victoza ), metformin ER 2000 mg, Jardiance 25 mg  With plans to changed to Ozempic .    Patient has no complaints today.  Patient is following a low glycemic index diet and taking all prescribed medications regularly without side effects.  Fasting sugars have been under less than 140 most of the time and post prandials have been under 160 except on rare occasions. Patient is exercising about 1 times per week and intentionally trying to lose weight .  Patient has had an eye exam in the last 12 months and checks feet regularly for signs of infection.  Patient does not walk barefoot outside,  And denies an numbness tingling or burning in feet. Patient is up to date on all recommended vaccinations   Outpatient Medications Prior to Visit  Medication Sig Dispense Refill  . amLODipine (NORVASC) 10 MG tablet TAKE 1 TABLET(10 MG) BY MOUTH DAILY 90 tablet 3  . aspirin 81 MG chewable tablet CHEW AND SWALLOW 1 TABLET BY MOUTH EVERY DAY 90 tablet 0  . atorvastatin (LIPITOR) 20 MG tablet TAKE 1 TABLET BY MOUTH EVERY DAY 90 tablet 2  . Continuous Blood Gluc Sensor (FREESTYLE LIBRE 14  DAY SENSOR) MISC by Other route every fourteen (14) days.    . Continuous Blood Gluc Sensor (FREESTYLE LIBRE SENSOR SYSTEM) MISC USE AS DIRECTED. CHANGE EVERY 10 DAYS    . JARDIANCE 25 MG TABS tablet Take 25 mg by mouth daily. 90 tablet 0  . ketoconazole (NIZORAL) 2 % shampoo   5  . losartan-hydrochlorothiazide (HYZAAR) 100-25 MG tablet Take 1 tablet by mouth daily. 90 tablet 3  . metFORMIN (GLUCOPHAGE-XR) 500 MG 24 hr tablet Take 4 tablets (2,000 mg total) by mouth daily. 360 tablet 0  . Multiple Vitamins-Minerals (MULTIVITAMIN WITH MINERALS) tablet Take 1 tablet by mouth daily.    . nitroGLYCERIN (NITROSTAT) 0.4 MG SL tablet Place 1 tablet (0.4 mg total) under the tongue every 5 (five) minutes as needed for chest pain. 50 tablet 3  . XULTOPHY 100-3.6 UNIT-MG/ML SOPN Inject 34 Units into the skin daily. (Patient taking differently: Inject 17 Units into the skin daily. ) 10 pen 3  . nystatin (NYSTATIN) powder Apply topically 2 (two) times daily. (Patient not taking: Reported on 08/24/2018) 15 g 0   No facility-administered medications prior to visit.     Review of Systems;  Patient denies headache, fevers, malaise, unintentional weight loss, skin rash, eye pain, sinus congestion and sinus pain, sore throat, dysphagia,  hemoptysis , cough, dyspnea, wheezing, chest pain, palpitations, orthopnea, edema, abdominal pain,  nausea, melena, diarrhea, constipation, flank pain, dysuria, hematuria, urinary  Frequency, nocturia, numbness, tingling, seizures,  Focal weakness, Loss of consciousness,  Tremor, insomnia, depression, anxiety, and suicidal ideation.      Objective:  BP 120/76 (BP Location: Right Arm, Patient Position: Sitting, Cuff Size: Large)   Pulse 88   Temp 97.8 F (36.6 C) (Oral)   Resp 15   Ht 5\' 10"  (1.778 m)   Wt 214 lb 3.2 oz (97.2 kg)   SpO2 96%   BMI 30.73 kg/m   BP Readings from Last 3 Encounters:  08/24/18 120/76  03/03/18 140/82  09/03/17 140/82    Wt Readings from  Last 3 Encounters:  08/24/18 214 lb 3.2 oz (97.2 kg)  03/03/18 230 lb 6.4 oz (104.5 kg)  09/03/17 228 lb (103.4 kg)    General appearance: alert, cooperative and appears stated age Ears: normal TM's and external ear canals both ears Throat: lips, mucosa, and tongue normal; teeth and gums normal Neck: no adenopathy, no carotid bruit, supple, symmetrical, trachea midline and thyroid not enlarged, symmetric, no tenderness/mass/nodules Back: symmetric, no curvature. ROM normal. No CVA tenderness. Lungs: clear to auscultation bilaterally Heart: regular rate and rhythm, S1, S2 normal, no murmur, click, rub or gallop Abdomen: soft, non-tender; bowel sounds normal; no masses,  no organomegaly Pulses: 2+ and symmetric Skin: Skin color, texture, turgor normal. No rashes or lesions Lymph nodes: Cervical, supraclavicular, and axillary nodes normal.  Lab Results  Component Value Date   HGBA1C 7.2 08/17/2018   HGBA1C 6.8 02/01/2018   HGBA1C 6.4 02/12/2017    Lab Results  Component Value Date   CREATININE 0.95 08/24/2018   CREATININE 0.94 03/03/2018   CREATININE 0.90 09/03/2017    Lab Results  Component Value Date   WBC 12.7 (H) 05/27/2016   HGB 15.9 05/27/2016   HCT 46.0 05/27/2016   PLT 307.0 05/27/2016   GLUCOSE 137 (H) 08/24/2018   CHOL 153 08/24/2018   TRIG 55.0 08/24/2018   HDL 42.80 08/24/2018   LDLDIRECT 82 12/23/2014   LDLCALC 99 08/24/2018   ALT 21 08/24/2018   AST 18 08/24/2018   NA 138 08/24/2018   K 4.3 08/24/2018   CL 102 08/24/2018   CREATININE 0.95 08/24/2018   BUN 10 08/24/2018   CO2 28 08/24/2018   TSH 0.64 08/29/2015   PSA 0.50 03/03/2018   HGBA1C 7.2 08/17/2018   MICROALBUR <0.7 08/24/2018       Assessment & Plan:   Problem List Items Addressed This Visit    Diabetes mellitus without complication (HCC)    [facility]  With most recent  a1c was 7.2.   He has been making more effort to follow a low GI diet , encouraged to  exercise  Lab Results  Component Value Date   HGBA1C 7.2 08/17/2018   Lab Results  Component Value Date   MICROALBUR <0.7 08/24/2018         Relevant Orders   Microalbumin / creatinine urine ratio (Completed)   Comprehensive metabolic panel (Completed)   Dyslipidemia, goal LDL below 70 - Primary    He has RESUMED  Lipitor  And LDL is at goal  LFTs are normal. . Lab Results  Component Value Date   CHOL 153 08/24/2018   HDL 42.80 08/24/2018   LDLCALC 99 08/24/2018   LDLDIRECT 82 12/23/2014   TRIG 55.0 08/24/2018   CHOLHDL 4 08/24/2018   Lab Results  Component Value Date   ALT 21 08/24/2018  AST 18 08/24/2018   ALKPHOS 69 08/24/2018   BILITOT 0.4 08/24/2018         Relevant Orders   Lipid panel (Completed)   Hypertension    Well controlled on current regimen. Renal function stable, no changes today.  Lab Results  Component Value Date   CREATININE 0.95 08/24/2018   Lab Results  Component Value Date   NA 138 08/24/2018   K 4.3 08/24/2018   CL 102 08/24/2018   CO2 28 08/24/2018         Obesity (BMI 30-39.9)    I have addressed  BMI and recommended wt loss of 10% of body weight over the next 6 months using a low glycemic index diet and regular exercise a minimum of 5 days per week.          .A total of 25 minutes of face to face time was spent with patient more than half of which was spent in counselling about the above mentioned conditions  and coordination of care   I have discontinued Tushar C. Clodfelter's nystatin. I am also having him maintain his multivitamin with minerals, amLODipine, nitroGLYCERIN, FREESTYLE LIBRE SENSOR SYSTEM, FREESTYLE LIBRE 14 DAY SENSOR, ketoconazole, JARDIANCE, XULTOPHY, metFORMIN, losartan-hydrochlorothiazide, atorvastatin, and aspirin.  No orders of the defined types were placed in this encounter.   Medications Discontinued During This Encounter  Medication Reason  . nystatin (NYSTATIN) powder Completed Course     Follow-up: No follow-ups on file.   Sherlene Shamseresa L Ailine Hefferan, MD

## 2018-08-24 NOTE — Patient Instructions (Signed)
Diabetes Mellitus and Exercise Exercising regularly is important for your overall health, especially when you have diabetes (diabetes mellitus). Exercising is not only about losing weight. It has many other health benefits, such as increasing muscle strength and bone density and reducing body fat and stress. This leads to improved fitness, flexibility, and endurance, all of which result in better overall health. Exercise has additional benefits for people with diabetes, including:  Reducing appetite.  Helping to lower and control blood glucose.  Lowering blood pressure.  Helping to control amounts of fatty substances (lipids) in the blood, such as cholesterol and triglycerides.  Helping the body to respond better to insulin (improving insulin sensitivity).  Reducing how much insulin the body needs.  Decreasing the risk for heart disease by: ? Lowering cholesterol and triglyceride levels. ? Increasing the levels of good cholesterol. ? Lowering blood glucose levels. What is my activity plan? Your health care provider or certified diabetes educator can help you make a plan for the type and frequency of exercise (activity plan) that works for you. Make sure that you:  Do at least 150 minutes of moderate-intensity or vigorous-intensity exercise each week. This could be brisk walking, biking, or water aerobics. ? Do stretching and strength exercises, such as yoga or weightlifting, at least 2 times a week. ? Spread out your activity over at least 3 days of the week.  Get some form of physical activity every day. ? Do not go more than 2 days in a row without some kind of physical activity. ? Avoid being inactive for more than 30 minutes at a time. Take frequent breaks to walk or stretch.  Choose a type of exercise or activity that you enjoy, and set realistic goals.  Start slowly, and gradually increase the intensity of your exercise over time. What do I need to know about managing my  diabetes?   Check your blood glucose before and after exercising. ? If your blood glucose is 240 mg/dL (13.3 mmol/L) or higher before you exercise, check your urine for ketones. If you have ketones in your urine, do not exercise until your blood glucose returns to normal. ? If your blood glucose is 100 mg/dL (5.6 mmol/L) or lower, eat a snack containing 15-20 grams of carbohydrate. Check your blood glucose 15 minutes after the snack to make sure that your level is above 100 mg/dL (5.6 mmol/L) before you start your exercise.  Know the symptoms of low blood glucose (hypoglycemia) and how to treat it. Your risk for hypoglycemia increases during and after exercise. Common symptoms of hypoglycemia can include: ? Hunger. ? Anxiety. ? Sweating and feeling clammy. ? Confusion. ? Dizziness or feeling light-headed. ? Increased heart rate or palpitations. ? Blurry vision. ? Tingling or numbness around the mouth, lips, or tongue. ? Tremors or shakes. ? Irritability.  Keep a rapid-acting carbohydrate snack available before, during, and after exercise to help prevent or treat hypoglycemia.  Avoid injecting insulin into areas of the body that are going to be exercised. For example, avoid injecting insulin into: ? The arms, when playing tennis. ? The legs, when jogging.  Keep records of your exercise habits. Doing this can help you and your health care provider adjust your diabetes management plan as needed. Write down: ? Food that you eat before and after you exercise. ? Blood glucose levels before and after you exercise. ? The type and amount of exercise you have done. ? When your insulin is expected to peak, if you use   insulin. Avoid exercising at times when your insulin is peaking.  When you start a new exercise or activity, work with your health care provider to make sure the activity is safe for you, and to adjust your insulin, medicines, or food intake as needed.  Drink plenty of water while  you exercise to prevent dehydration or heat stroke. Drink enough fluid to keep your urine clear or pale yellow. Summary  Exercising regularly is important for your overall health, especially when you have diabetes (diabetes mellitus).  Exercising has many health benefits, such as increasing muscle strength and bone density and reducing body fat and stress.  Your health care provider or certified diabetes educator can help you make a plan for the type and frequency of exercise (activity plan) that works for you.  When you start a new exercise or activity, work with your health care provider to make sure the activity is safe for you, and to adjust your insulin, medicines, or food intake as needed. This information is not intended to replace advice given to you by your health care provider. Make sure you discuss any questions you have with your health care provider. Document Released: 10/12/2003 Document Revised: 01/30/2017 Document Reviewed: 01/01/2016 Elsevier Interactive Patient Education  2019 Elsevier Inc.  

## 2018-08-26 NOTE — Assessment & Plan Note (Signed)
I have addressed  BMI and recommended wt loss of 10% of body weight over the next 6 months using a low glycemic index diet and regular exercise a minimum of 5 days per week.   

## 2018-08-26 NOTE — Assessment & Plan Note (Addendum)
Managed by his endocrinologist at [facility]  With most recent  a1c was 7.2.   He has been making more effort to follow a low GI diet , encouraged to exercise  Lab Results  Component Value Date   HGBA1C 7.2 08/17/2018   Lab Results  Component Value Date   MICROALBUR <0.7 08/24/2018

## 2018-08-26 NOTE — Assessment & Plan Note (Signed)
He has RESUMED  Lipitor  And LDL is at goal  LFTs are normal. . Lab Results  Component Value Date   CHOL 153 08/24/2018   HDL 42.80 08/24/2018   LDLCALC 99 08/24/2018   LDLDIRECT 82 12/23/2014   TRIG 55.0 08/24/2018   CHOLHDL 4 08/24/2018   Lab Results  Component Value Date   ALT 21 08/24/2018   AST 18 08/24/2018   ALKPHOS 69 08/24/2018   BILITOT 0.4 08/24/2018

## 2018-08-26 NOTE — Assessment & Plan Note (Signed)
Well controlled on current regimen. Renal function stable, no changes today.  Lab Results  Component Value Date   CREATININE 0.95 08/24/2018   Lab Results  Component Value Date   NA 138 08/24/2018   K 4.3 08/24/2018   CL 102 08/24/2018   CO2 28 08/24/2018

## 2018-09-01 IMAGING — US US ABDOMEN COMPLETE
1 series · 14 of 25 positions shown · non-contrast
Comparison: CT scan of the chest, abdomen, and pelvis October 27, 2005

CLINICAL DATA: Nausea and vomiting and chest pain.

EXAM:
ABDOMEN ULTRASOUND COMPLETE

[Series 1: us abdomen complete · 0.26mm/px · 14 of 82 slices shown]
[im 1/82]
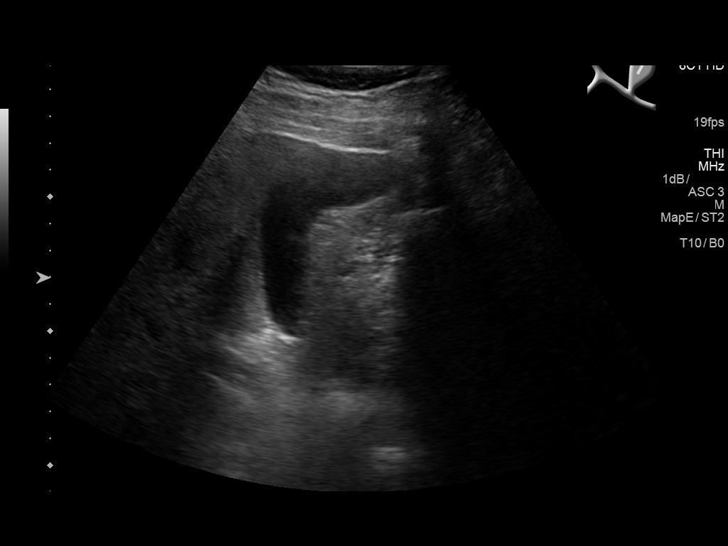
[im 7/82]
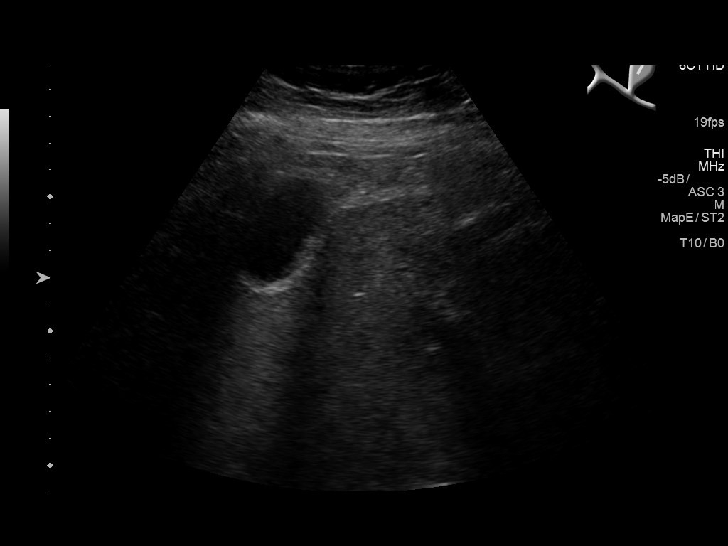
[im 14/82]
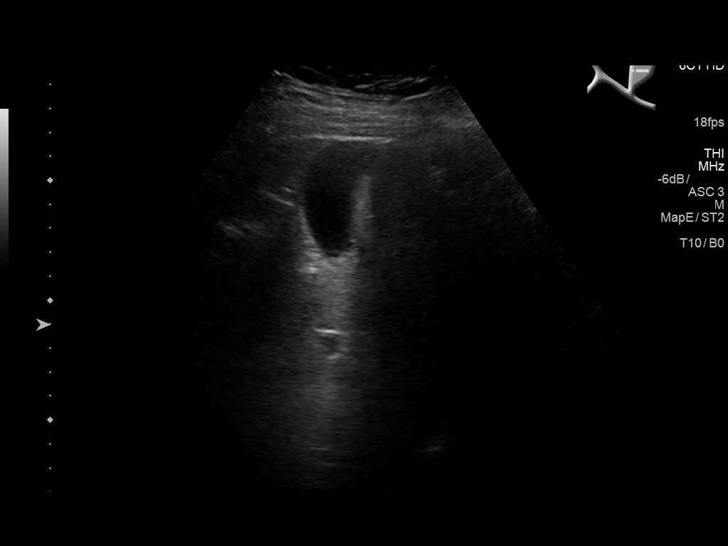
[im 21/82]
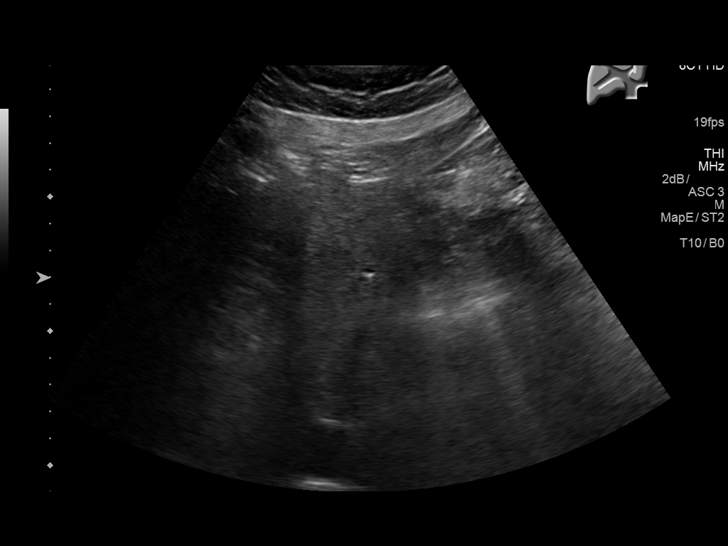
[im 28/82]
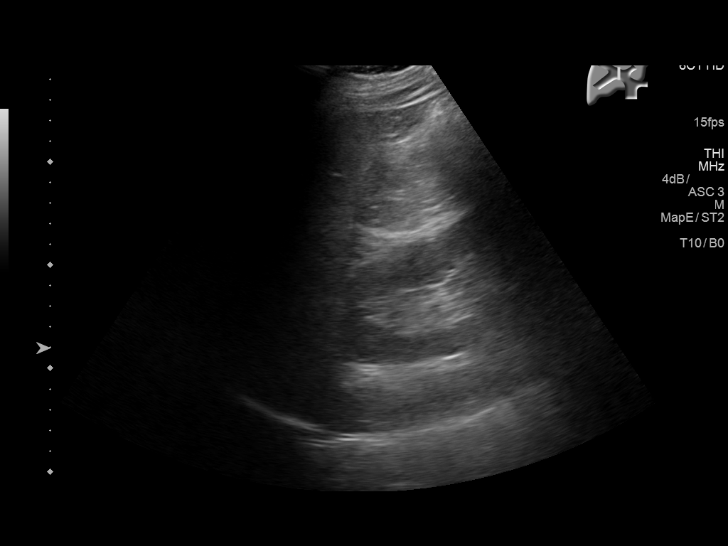
[im 31/82]
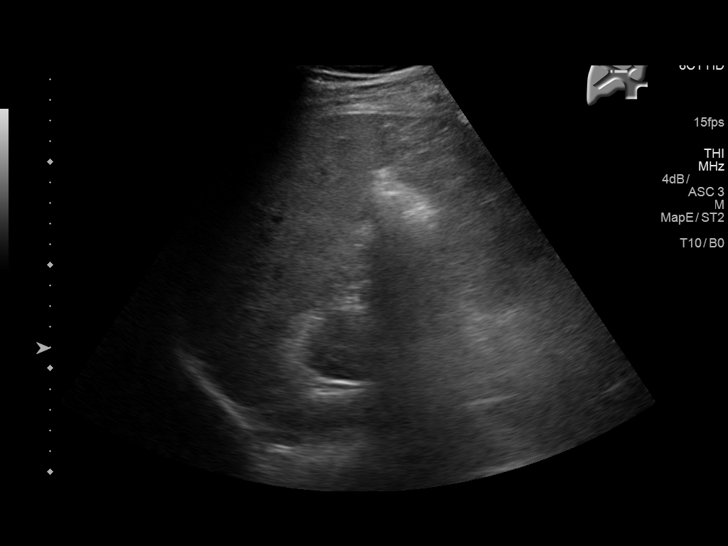
[im 38/82]
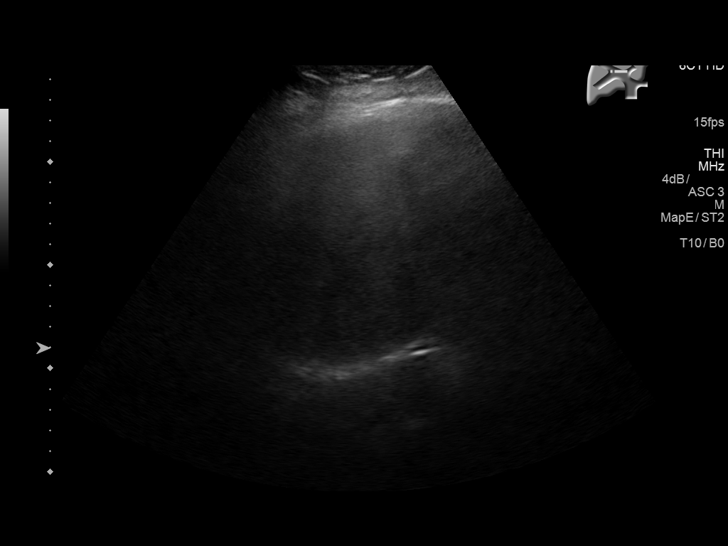
[im 44/82]
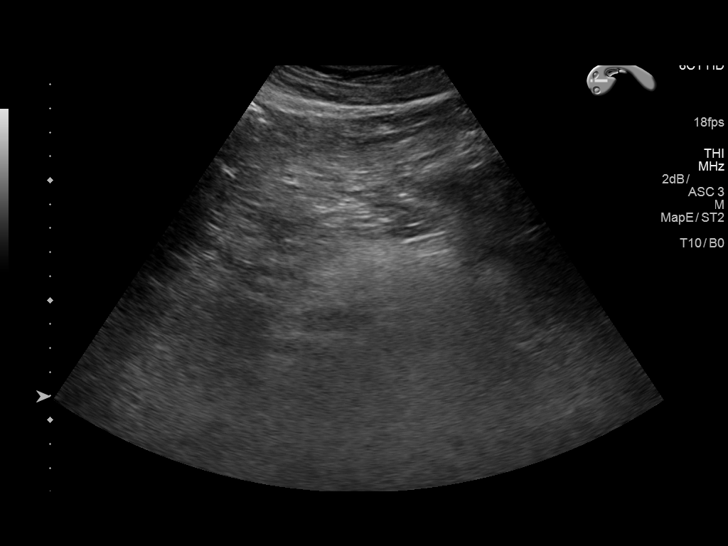
[im 51/82]
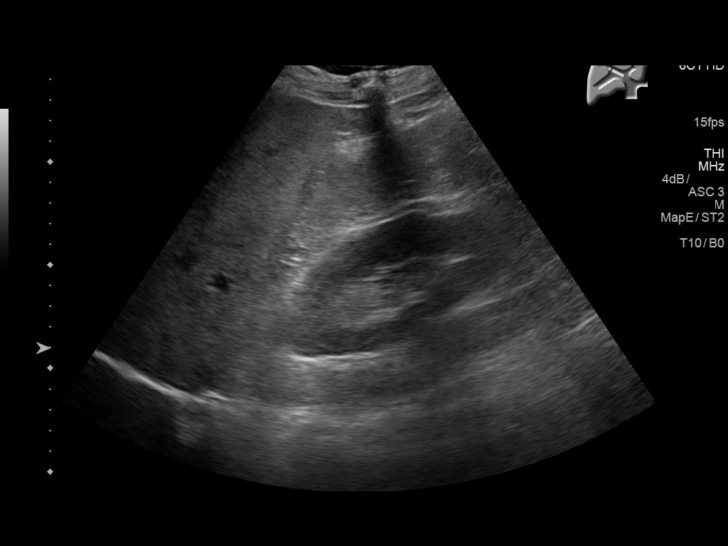
[im 55/82]
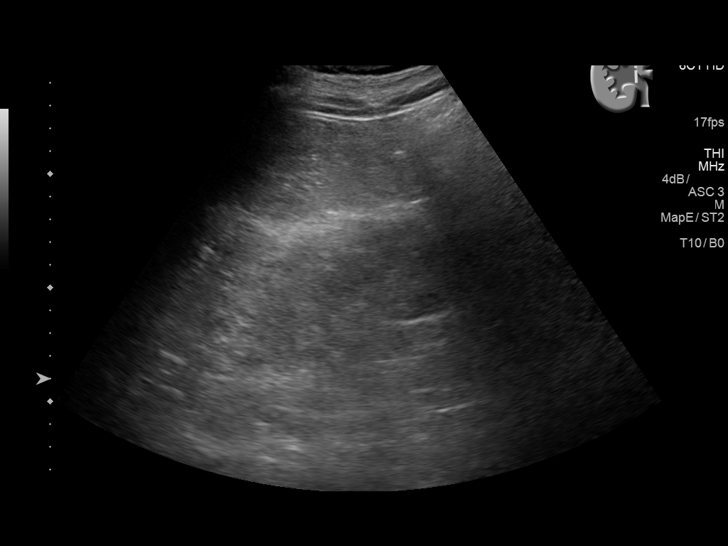
[im 61/82]
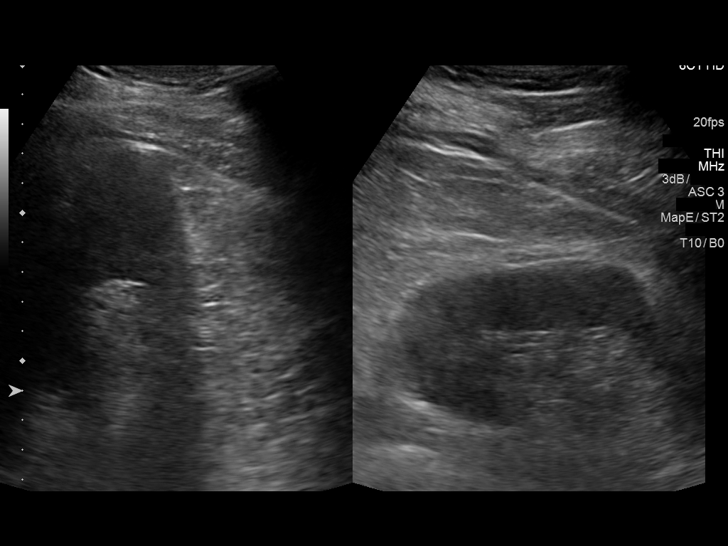
[im 68/82]
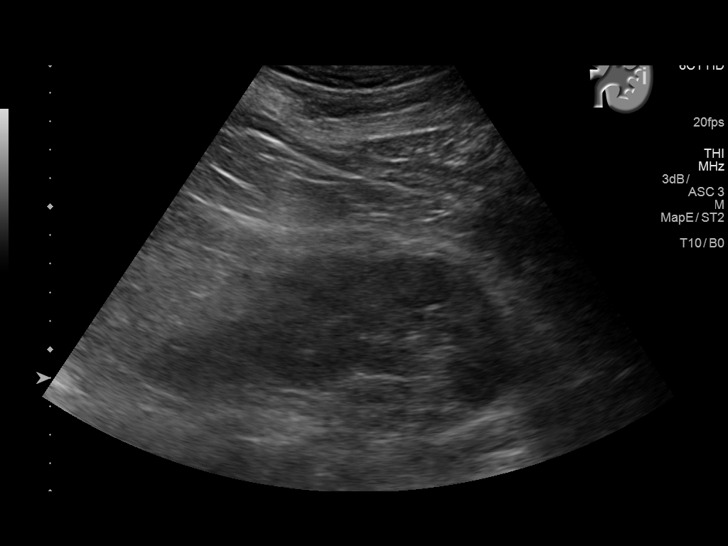
[im 75/82]
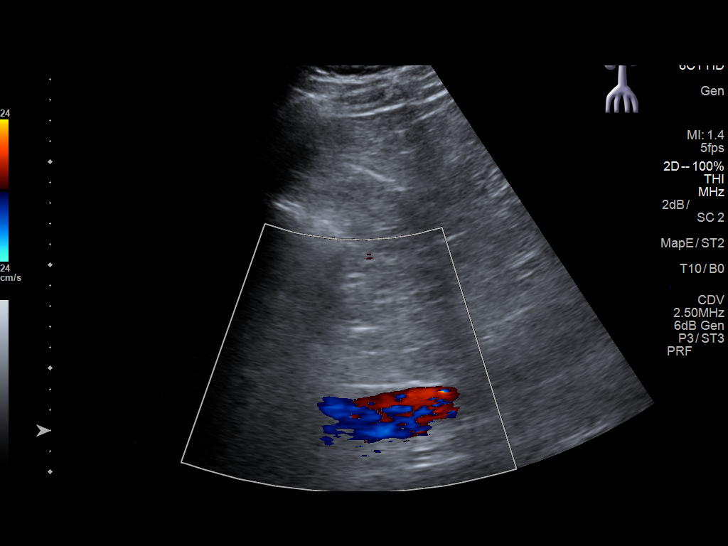
[im 82/82]
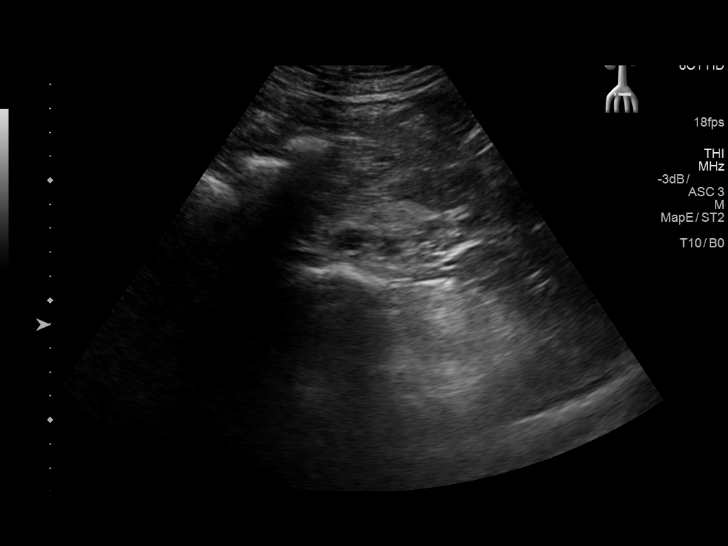

[14 of 25 positions shown; findings below may reference images not displayed]

FINDINGS: Gallbladder: No gallstones or wall thickening visualized. No
sonographic Murphy sign noted by sonographer.

Common bile duct: Diameter: 4.3 mm

Liver: The hepatic echotexture is mildly increased diffusely. There
is no focal mass or ductal dilation.

IVC: No abnormality visualized.

Pancreas: Visualized portion unremarkable.

Spleen: Size and appearance within normal limits.

Right Kidney: Length: 11.4 cm. Echogenicity within normal limits. No
mass or hydronephrosis visualized.

Left Kidney: Length: 10.8 cm. Echogenicity within normal limits. No
mass or hydronephrosis visualized.

Abdominal aorta: No aneurysm visualized.

Other findings: None.
IMPRESSION: 1. Mildly increased hepatic echotexture likely reflects fatty
infiltrative change. Normal appearance of the gallbladder. If there
are clinical concerns of chronic cholecystitis, a nuclear medicine
hepatobiliary scan with gallbladder ejection fraction determination
may be useful.
2. No acute abnormality observed within the abdomen.

## 2018-09-27 ENCOUNTER — Other Ambulatory Visit: Payer: Self-pay | Admitting: Internal Medicine

## 2018-10-26 ENCOUNTER — Telehealth: Payer: Self-pay | Admitting: Internal Medicine

## 2018-10-26 ENCOUNTER — Other Ambulatory Visit: Payer: Self-pay

## 2018-10-26 ENCOUNTER — Ambulatory Visit: Payer: Self-pay

## 2018-10-26 ENCOUNTER — Ambulatory Visit
Admission: EM | Admit: 2018-10-26 | Discharge: 2018-10-26 | Disposition: A | Discharge status: Home / Self Care | Payer: BC Managed Care – PPO | Attending: Family Medicine | Admitting: Family Medicine

## 2018-10-26 ENCOUNTER — Encounter: Payer: Self-pay | Admitting: Emergency Medicine

## 2018-10-26 ENCOUNTER — Ambulatory Visit: Payer: Self-pay | Admitting: Internal Medicine

## 2018-10-26 DIAGNOSIS — J069 Acute upper respiratory infection, unspecified: Secondary | ICD-10-CM

## 2018-10-26 DIAGNOSIS — J40 Bronchitis, not specified as acute or chronic: Secondary | ICD-10-CM | POA: Diagnosis not present

## 2018-10-26 DIAGNOSIS — B9789 Other viral agents as the cause of diseases classified elsewhere: Secondary | ICD-10-CM

## 2018-10-26 MED ORDER — FLUTICASONE PROPIONATE 50 MCG/ACT NA SUSP: 1.0000 | Freq: Every day | NASAL | 2 refills | Status: DC

## 2018-10-26 MED ORDER — ALBUTEROL SULFATE HFA 108 (90 BASE) MCG/ACT IN AERS: 1.0000 | INHALATION_SPRAY | Freq: Four times a day (QID) | RESPIRATORY_TRACT | 0 refills | Status: DC | PRN

## 2018-10-26 MED ORDER — GUAIFENESIN-DM 100-10 MG/5ML PO SYRP: 5.0000 mL | ORAL_SOLUTION | ORAL | 0 refills | Status: DC | PRN

## 2018-10-26 MED ORDER — CETIRIZINE HCL 10 MG PO TABS: 10.0000 mg | ORAL_TABLET | Freq: Every day | ORAL | 0 refills | Status: DC

## 2018-10-26 NOTE — ED Triage Notes (Signed)
Patient c/o cough/cold symptoms that started 10 days ago. Patient denies fever. States shortness of breath started over the weekend.  2 weeks ago wife went on a cruise and became sick with fever. She traveled to [place] and [place] Her symptoms have resolved.

## 2018-10-26 NOTE — Telephone Encounter (Signed)
Pt negative except for mild SOB and dry cough for Covid 19. Pt c/o mid SOB with exertion and pt c/o pt cannot get a good breath in. Denies fever, difficulty breathing. No international  high risk travel to affected and no known exposure to anyone who tested positive for Covid 19. Denies fever. Symptoms began yesterday. Care advice given and pt given an appointment for today with Dr. Darrick Huntsman.   Reason for Disposition . [1] MILD difficulty breathing (e.g., minimal/no SOB at rest, SOB with walking, pulse <100) AND [2] NEW-onset or WORSE than normal  Answer Assessment - Initial Assessment Questions 1. RESPIRATORY STATUS: "Describe your breathing?" (e.g., wheezing, shortness of breath, unable to speak, severe coughing)      SOB with exertion- tightness cant get a good breath in 2. ONSET: "When did this breathing problem begin?"      Yesterday 3. PATTERN "Does the difficult breathing come and go, or has it been constant since it started?"      constant 4. SEVERITY: "How bad is your breathing?" (e.g., mild, moderate, severe)    - MILD: No SOB at rest, mild SOB with walking, speaks normally in sentences, can lay down, no retractions, pulse < 100.    - MODERATE: SOB at rest, SOB with minimal exertion and prefers to sit, cannot lie down flat, speaks in phrases, mild retractions, audible wheezing, pulse 100-120.    - SEVERE: Very SOB at rest, speaks in single words, struggling to breathe, sitting hunched forward, retractions, pulse > 120      mild 5. RECURRENT SYMPTOM: "Have you had difficulty breathing before?" If so, ask: "When was the last time?" and "What happened that time?"      no 6. CARDIAC HISTORY: "Do you have any history of heart disease?" (e.g., heart attack, angina, bypass surgery, angioplasty)      no 7. LUNG HISTORY: "Do you have any history of lung disease?"  (e.g., pulmonary embolus, asthma, emphysema)     no 8. CAUSE: "What do you think is causing the breathing problem?"      no 9.  OTHER SYMPTOMS: "Do you have any other symptoms? (e.g., dizziness, runny nose, cough, chest pain, fever)     Cough Dry cough 10. PREGNANCY: "Is there any chance you are pregnant?" "When was your last menstrual period?"       n/a 11. TRAVEL: "Have you traveled out of the country in the last month?" (e.g., travel history, exposures)       No international travel or high risk areas in the [place]  Protocols used: BREATHING DIFFICULTY-A-AH

## 2018-10-26 NOTE — Discharge Instructions (Addendum)
-  Albuterol: 1 to 2 puffs every 6 hours as needed for shortness of breath or frequent cough not relieved with the Robitussin -Robitussin prescription: Every 4 hours as needed for cough -Flonase: 1 spray to each nostril every morning -Cetirizine: 1 tablet nightly -Follow with primary care provider or this clinic as needed if symptoms worsen or not improved. -Symptoms could be lingering from your previous cold but suspect some irritated airway.

## 2018-10-26 NOTE — ED Provider Notes (Signed)
[facility]    CSN: [id number] Arrival date & time: 10/26/18  1321     History   Chief Complaint Chief Complaint  Patient presents with   Cough   Shortness of Breath    HPI Joshua Arellano is a 53 y.o. male.   Patient is a 53 year old male with past medical history as below who presents with complaint of some shortness of breath that started yesterday.  Patient states he started feeling bad on Friday.  Today is Monday.  Patient reports he had a cold about 2 weeks ago and still has a little bit of a "post viral cough" that is nonproductive.  Patient does report his wife returned from a cruise on [place] about 2 weeks ago and afterwards had a fever and stayed in bed for about 3 days.  LA with her was also sick.  He states that did not have any information regarding antibiotics sick on her cruise but his wife did say there was somebody on the plane coming back that was "hacking coughing during the whole flight".  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  Patient does not take anything over-the-counter for this.  Patient did state he had little of a sore throat with his cold symptoms about 2 weeks ago but nothing since.  Patient denies any fever or body aches.  Patient denies any chest pain.  Patient denies any abdominal issues.   All Past Medical History:  Diagnosis Date   Diabetes mellitus    Hemorrhoid    Hemorrhoids    Hyperlipidemia    Hypertension    Knee pain, left    episodic   Obesity (BMI 30-39.9)    Sleep apnea    Uses C-PAP machine    Patient Active Problem List   Diagnosis Date Noted   Encounter for preventive health examination 01/30/2016   Other fatigue 08/29/2015   Chronic venous insufficiency 07/05/2015   OSA (obstructive sleep apnea) 12/25/2014   Prostate cancer screening 04/30/2012   Obesity (BMI 30-39.9) 07/11/2011   Hemorrhoid    Diabetes mellitus without complication  (HCC) 04/09/2011   Dyslipidemia, goal LDL below 70 04/09/2011   Hypertension 04/09/2011    Past Surgical History:  Procedure Laterality Date   ANOSCOPY  10 years ago   Hemorrhoids   COLONOSCOPY WITH PROPOFOL N/A 02/28/2016   Procedure: COLONOSCOPY WITH PROPOFOL;  Surgeon: Earline Mayotte, MD;  Location: [facility] ENDOSCOPY;  Service: Endoscopy;  Laterality: N/A;   TONSILLECTOMY  1972      Home Medications    Prior to Admission medications   Medication Sig Start Date End Date Taking? Authorizing Provider  amLODipine (NORVASC) 10 MG tablet TAKE 1 TABLET(10 MG) BY MOUTH DAILY 01/30/16  Yes Sherlene Shams, MD  aspirin 81 MG chewable tablet CHEW AND SWALLOW 1 TABLET BY MOUTH EVERY DAY 09/28/18  Yes Sherlene Shams, MD  atorvastatin (LIPITOR) 20 MG tablet TAKE 1 TABLET BY MOUTH EVERY DAY 03/09/18  Yes Sherlene Shams, MD  Continuous Blood Gluc Sensor (FREESTYLE LIBRE 14 DAY SENSOR) MISC by Other route every fourteen (14) days. 11/14/17  Yes [provider]  Continuous Blood Gluc Sensor (FREESTYLE LIBRE SENSOR SYSTEM) MISC USE AS DIRECTED. CHANGE EVERY 10 DAYS 09/08/17  Yes [provider]  JARDIANCE 25 MG TABS tablet Take 25 mg by mouth daily. 03/03/18  Yes Sherlene Shams, MD  ketoconazole (NIZORAL) 2 % shampoo  02/23/18  Yes [provider]  losartan-hydrochlorothiazide (HYZAAR) 100-25 MG tablet Take 1 tablet by mouth daily. 03/03/18  Yes Sherlene Shams, MD  metFORMIN (GLUCOPHAGE-XR) 500 MG 24 hr tablet Take 4 tablets (2,000 mg total) by mouth daily. 03/03/18  Yes Sherlene Shams, MD  Multiple Vitamins-Minerals (MULTIVITAMIN WITH MINERALS) tablet Take 1 tablet by mouth daily.   Yes [provider]  nitroGLYCERIN (NITROSTAT) 0.4 MG SL tablet Place 1 tablet (0.4 mg total) under the tongue every 5 (five) minutes as needed for chest pain. 05/27/16  Yes Sherlene Shams, MD  XULTOPHY 100-3.6 UNIT-MG/ML SOPN Inject 34 Units into the skin daily. Patient taking  differently: Inject 17 Units into the skin daily.  03/03/18  Yes Sherlene Shams, MD  albuterol (PROVENTIL HFA;VENTOLIN HFA) 108 (90 Base) MCG/ACT inhaler Inhale 1-2 puffs into the lungs every 6 (six) hours as needed for wheezing or shortness of breath (or cough). 10/26/18   Candis Schatz, PA-C  cetirizine (ZYRTEC) 10 MG tablet Take 1 tablet (10 mg total) by mouth at bedtime. 10/26/18   Candis Schatz, PA-C  fluticasone (FLONASE) 50 MCG/ACT nasal spray Place 1 spray into both nostrils daily. 10/26/18   Candis Schatz, PA-C  guaiFENesin-dextromethorphan (ROBITUSSIN DM) 100-10 MG/5ML syrup Take 5 mLs by mouth every 4 (four) hours as needed for cough. 10/26/18   Candis Schatz, PA-C    Family History Family History  Problem Relation Age of Onset   Transient ischemic attack Father    CAD Mother 14   Cancer Maternal Uncle        colon ca    Heart disease Maternal Grandfather    Hypertension Maternal Grandfather    Heart attack Maternal Grandfather    Hypertension Paternal Grandmother    Hypertension Paternal Grandfather     Social History Social History   Tobacco Use   Smoking status: Never Smoker   Smokeless tobacco: Never Used  Substance Use Topics   Alcohol use: Yes    Comment: occasional   Drug use: No     Allergies   Latex and Lisinopril   Review of Systems Review of Systems  As noted above in HPI.  Other systems reviewed and found to be negative.   Physical Exam Triage Vital Signs ED Triage Vitals  Enc Vitals Group     BP 10/26/18 1337 122/80     Pulse Rate 10/26/18 1337 (!) 102     Resp 10/26/18 1337 18     Temp 10/26/18 1337 98.2 F (36.8 C)     Temp Source 10/26/18 1337 Oral     SpO2 10/26/18 1337 99 %     Weight 10/26/18 1334 205 lb (93 kg)     Height 10/26/18 1334  (1.778 m)     Head Circumference --      Peak Flow --      Pain Score 10/26/18 1334 0     Pain Loc --      Pain Edu? --      Excl. in GC? --    No data  found.  Updated Vital Signs BP 122/80 (BP Location: Right Arm)    Pulse (!) 102    Temp 98.2 F (36.8 C) (Oral)    Resp 18    Ht  (1.778 m)    Wt 205 lb (93 kg)    SpO2 99%    BMI 29.41 kg/m    Physical Exam Constitutional:  Appearance: He is well-developed. He is not ill-appearing.  HENT:     Mouth/Throat:     Mouth: Mucous membranes are moist.     Comments: Clear postnasal drainage Eyes:     Extraocular Movements: Extraocular movements intact.     Pupils: Pupils are equal, round, and reactive to light.  Neck:     Musculoskeletal: Normal range of motion and neck supple.  Cardiovascular:     Rate and Rhythm: Regular rhythm. Tachycardia present.     Heart sounds: No murmur. No gallop.   Pulmonary:     Effort: Pulmonary effort is normal. No respiratory distress.     Breath sounds: No stridor.     Comments: Cough with deep breath Chest:     Chest wall: No tenderness or edema.  Musculoskeletal: Normal range of motion.  Lymphadenopathy:     Cervical: No cervical adenopathy.  Skin:    General: Skin is warm and dry.  Neurological:     General: No focal deficit present.     Mental Status: He is alert and oriented to person, place, and time.  Psychiatric:        Mood and Affect: Mood normal.        Behavior: Behavior normal.      UC Treatments / Results  Labs (all labs ordered are listed, but only abnormal results are displayed) Labs Reviewed - No data to display  EKG None  Radiology No results found.  Procedures Procedures (including critical care time)  Medications Ordered in UC Medications - No data to display  Initial Impression / Assessment and Plan / UC Course  I have reviewed the triage vital signs and the nursing notes.  Pertinent labs & imaging results that were available during my care of the patient were reviewed by me and considered in my medical decision making (see chart for details).     Patient with upper respiratory symptoms  started about 2 weeks ago has improved some still has a little bit of a cough.  Patient reports some shortness of breath that began yesterday after is not feeling well started on Friday.  Patient has had some mild postnasal drainage and had a cough with deep breath.  Likely reactive airway on top of upper respiratory viral illness.  Given prescription for albuterol as well as Robitussin, Flonase, and Zyrtec.-Follow-up without improvement  Final Clinical Impressions(s) / UC Diagnoses   Final diagnoses:  Viral URI with cough  Bronchitis     Discharge Instructions     -Albuterol: 1 to 2 puffs every 6 hours as needed for shortness of breath or frequent cough not relieved with the Robitussin -Robitussin prescription: Every 4 hours as needed for cough -Flonase: 1 spray to each nostril every morning -Cetirizine: 1 tablet nightly -Follow with primary care provider or this clinic as needed if symptoms worsen or not improved. -Symptoms could be lingering from your previous cold but suspect some irritated airway.    ED Prescriptions    Medication Sig Dispense Auth. Provider   albuterol (PROVENTIL HFA;VENTOLIN HFA) 108 (90 Base) MCG/ACT inhaler Inhale 1-2 puffs into the lungs every 6 (six) hours as needed for wheezing or shortness of breath (or cough). 1 Inhaler Candis Schatz, PA-C   guaiFENesin-dextromethorphan (ROBITUSSIN DM) 100-10 MG/5ML syrup Take 5 mLs by mouth every 4 (four) hours as needed for cough. 118 mL Candis Schatz, PA-C   fluticasone [facility]) 50 MCG/ACT nasal spray Place 1 spray into both nostrils daily. 16 g Sherrlyn Hock  D, PA-C   cetirizine (ZYRTEC) 10 MG tablet Take 1 tablet (10 mg total) by mouth at bedtime. 30 tablet Candis Schatz, PA-C     Controlled Substance Prescriptions [place] Controlled Substance Registry consulted? Not Applicable   Candis Schatz, PA-C 10/26/18 1424

## 2018-10-26 NOTE — Progress Notes (Deleted)
CALLED PATIENT AND ADVISED HE WOULD NEED TO BE SEEN AT URGENT CARE OR ed WITH THE SYMPTOMS OF SOB. PATIENT AGREED AND NOW AT [facility].

## 2018-12-14 ENCOUNTER — Other Ambulatory Visit: Payer: Self-pay | Admitting: Internal Medicine

## 2018-12-14 DIAGNOSIS — E119 Type 2 diabetes mellitus without complications: Secondary | ICD-10-CM

## 2018-12-30 ENCOUNTER — Other Ambulatory Visit: Payer: Self-pay

## 2018-12-30 MED ORDER — ASPIRIN 81 MG PO CHEW: CHEWABLE_TABLET | ORAL | 1 refills | Status: DC

## 2019-01-12 ENCOUNTER — Other Ambulatory Visit: Payer: Self-pay | Admitting: Internal Medicine

## 2019-01-12 DIAGNOSIS — E119 Type 2 diabetes mellitus without complications: Secondary | ICD-10-CM

## 2019-01-12 MED ORDER — SEMAGLUTIDE(0.25 OR 0.5MG/DOS) 2 MG/1.5ML ~~LOC~~ SOPN: 0.2500 mg | PEN_INJECTOR | SUBCUTANEOUS | 0 refills | Status: DC

## 2019-01-21 LAB — HM DIABETES EYE EXAM

## 2019-02-01 ENCOUNTER — Other Ambulatory Visit: Payer: Self-pay

## 2019-02-01 ENCOUNTER — Ambulatory Visit (INDEPENDENT_AMBULATORY_CARE_PROVIDER_SITE_OTHER): Payer: BC Managed Care – PPO | Admitting: Pharmacist

## 2019-02-01 ENCOUNTER — Encounter: Payer: Self-pay | Admitting: Pharmacist

## 2019-02-01 VITALS — BP 127/79 | HR 97 | Wt 221.0 lb

## 2019-02-01 DIAGNOSIS — E119 Type 2 diabetes mellitus without complications: Secondary | ICD-10-CM

## 2019-02-01 MED ORDER — OZEMPIC (0.25 OR 0.5 MG/DOSE) 2 MG/1.5ML ~~LOC~~ SOPN: 0.5000 mg | PEN_INJECTOR | SUBCUTANEOUS | 2 refills | Status: DC

## 2019-02-01 NOTE — Patient Instructions (Signed)
It was great to see you today!   Increase Ozempic to 0.5 mg once weekly at your next dose. This can eventually be maximized to 1 mg once weekly, if needed for blood sugar control.  Continue metformin 2000 mg once daily and Jardiance 25 mg daily.   If you end up not following up with endocrinology in August, I would be more than happy to see you again to continue to manage your diabetes with Dr. Derrel Nip.   Try Zyrtec once daily for a few weeks, and see if that helps your nagging cough.   Feel free to call with any questions or concerns!  Catie Darnelle Maffucci, PharmD, CPP

## 2019-02-01 NOTE — Progress Notes (Addendum)
S:     Chief Complaint  Patient presents with  . Medication Management    Diabetes    Patient arrives in good spirits.  Presents for diabetes evaluation, education, and management at the request of Dr. Derrel Nip (my chart message on 01/09/2019). Patient follows with [facility] (Dr. Annamaria Boots) for the past ~10 years, but had a difficult time communicating regarding transition from [facility] to [facility], so asked Dr. Derrel Nip to send in for him.   Today, he notes that he plans to call [facility] soon to schedule for f/u in August. He has taken 4 weeks of Ozempic 0.25 mg, and is due to increase to 0.5 at his next dose. Endorses a positive reduction in his appetite with this medication, moreso than he felt with Xultophy. Denies nausea, vomiting, diarrhea.   He also reports a nagging cough over the past few weeks; asks if it could be related to any of his medications.   Patient reports Diabetes was diagnosed around age 53.   Family/Social History: Grandmother;   Insurance coverage/medication affordability: [place] BCBS  Patient reports adherence with medications.  Current diabetes medications include: Metformin 2000 mg, Ozempic 0.25 mg once weekly; Jardiance 25 mg daily Current hypertension medications include: losartan/HCTZ 100/25 mg daily, amlodipine 10 mg daily Current hyperlipidemia medications include: atorvastatin 20 mg daily  Patient denies hypoglycemic events.  Patient reported dietary habits: Eats 3 meals/day Breakfast: Sausage + egg biscuit + extra egg and sausauge Lunch: none Dinner: Varies; tries to stay under 126 g CHO Snacks: Has a hard time with evening snacks (2 oreos, 3-4 M&Ms) Drinks: Diet, caffeine free [employer]; doesn't like water   Patient-reported exercise habits: Not a lot lately;    Patient reports nocturia.  Patient denies neuropathy.  O:  Physical Exam Vitals signs reviewed.     Review of Systems  All other systems reviewed and are negative.    Lab Results  Component Value Date   HGBA1C 7.2 08/17/2018   Vitals:   02/01/19 1521  BP: 127/79  Pulse: 97    Lipid Panel     Component Value Date/Time   CHOL 153 08/24/2018 1024   TRIG 55.0 08/24/2018 1024   HDL 42.80 08/24/2018 1024   CHOLHDL 4 08/24/2018 1024   VLDL 11.0 08/24/2018 1024   LDLCALC 99 08/24/2018 1024   LDLDIRECT 82 12/23/2014 1643   Self Monitored Blood Glucose:  - Uses FreeStyle Libre CGM; reports 30 day average of ~168.   Clinical ASCVD: No  The 10-year ASCVD risk score Mikey Bussing DC Jr., et al., 2013) is: 9.3%   Values used to calculate the score:     Age: 53 years     Sex: Male     Is Non-Hispanic African American: No     Diabetic: Yes     Tobacco smoker: No     Systolic Blood Pressure: 932 mmHg     Is BP treated: Yes     HDL Cholesterol: 42.8 mg/dL     Total Cholesterol: 153 mg/dL    A/P: #Diabetes - Currently uncontrolled, most recent A1c 7.2% on 08/18/2018; Goal A1c <7%. Denies tolerability concerns with Ozempic, appropriate to increase dose.  - Increase Ozempic to 0.5 mg once weekly. Counseled on mechanism of action, long term benefits.  - Continue metformin XR 2000 mg daily and Jardiance 25 mg daily. Counseled on long term benefits.  - Encouraged continued focus on carbohydrate monitoring - Discussed ADA recommendations for 150 minutes of moderate intensity exercise  weekly; recommended to focus on increasing on a weekly basis (eg, targeting 20 minutes of walking 2-3 days per week as an initial goal) - Deferring A1c today for endocrinology, and as A1c would not change management at this time.   #Hypertension currently controlled. Goal BP <130/80  - Continue losartan/HCTZ 100/25 mg daily and amlodipine 10 mg daily.  - Discussed potential to change amlodipine to QPM administration, but patient notes that he has a hard time remembering evening administration   #ASCVD risk - primary prevention in patient aged 55-75 with DM; LDL <100, though 10  year ASCVD risk < 20%, moderate intensity statin indicated - Continued Aspirin 81 mg  - Continued atorvastatin 20 mg.   #Cough- Unlikely his cough is related to any medications. Patient never took any antihistamine or cough/cold remedies when prescribed in March. Recommended he try daily antihistamine to see if it helps improve cough, as could be allergic. Could also be post-viral lingering cough. Discussed this with patient.   Written patient instructions provided.  Total time in face to face counseling 30 minutes.     Follow up PCP Clinic Visit in as scheduled, Pharmacist as needed in the future.  Catie Feliz Beamravis, PharmD, CPP PGY2 Ambulatory Care Pharmacy Resident, [facility] Phone: [phone]   I have reviewed the above information and agree with above.   Duncan Dulleresa Tullo, MD

## 2019-02-22 ENCOUNTER — Encounter: Payer: Self-pay | Admitting: Internal Medicine

## 2019-02-22 ENCOUNTER — Ambulatory Visit (INDEPENDENT_AMBULATORY_CARE_PROVIDER_SITE_OTHER): Payer: BC Managed Care – PPO | Admitting: Internal Medicine

## 2019-02-22 ENCOUNTER — Other Ambulatory Visit: Payer: Self-pay

## 2019-02-22 VITALS — BP 148/84 | HR 106 | Ht 70.0 in | Wt 219.2 lb

## 2019-02-22 DIAGNOSIS — Z125 Encounter for screening for malignant neoplasm of prostate: Secondary | ICD-10-CM

## 2019-02-22 DIAGNOSIS — E785 Hyperlipidemia, unspecified: Secondary | ICD-10-CM | POA: Diagnosis not present

## 2019-02-22 DIAGNOSIS — I10 Essential (primary) hypertension: Secondary | ICD-10-CM

## 2019-02-22 DIAGNOSIS — E119 Type 2 diabetes mellitus without complications: Secondary | ICD-10-CM

## 2019-02-22 DIAGNOSIS — E669 Obesity, unspecified: Secondary | ICD-10-CM | POA: Diagnosis not present

## 2019-02-22 MED ORDER — FLUOCINOLONE ACETONIDE 0.01 % OT OIL: 1.0000 [drp] | TOPICAL_OIL | Freq: Two times a day (BID) | OTIC | 0 refills | Status: DC | PRN

## 2019-02-22 MED ORDER — ATORVASTATIN CALCIUM 20 MG PO TABS: 20.0000 mg | ORAL_TABLET | Freq: Every day | ORAL | 2 refills | Status: DC

## 2019-02-22 MED ORDER — AMLODIPINE BESYLATE 10 MG PO TABS: ORAL_TABLET | ORAL | 3 refills | Status: DC

## 2019-02-22 MED ORDER — LOSARTAN POTASSIUM-HCTZ 100-25 MG PO TABS: 1.0000 | ORAL_TABLET | Freq: Every day | ORAL | 3 refills | Status: DC

## 2019-02-22 NOTE — Progress Notes (Signed)
Average BS reading over the last 90 days is 164.

## 2019-02-22 NOTE — Assessment & Plan Note (Addendum)
His 30 day average is elevated at 162 on current regiemn of Jardiance 25 mg ,  Metformin and Ozempic  Asked to check fasting sugars daily for one week,  ,

## 2019-02-22 NOTE — Assessment & Plan Note (Addendum)
He has RESUMED  Lipitor  And LDL is at goal  LFTs are due. . Lab Results  Component Value Date   CHOL 153 08/24/2018   HDL 42.80 08/24/2018   LDLCALC 99 08/24/2018   LDLDIRECT 82 12/23/2014   TRIG 55.0 08/24/2018   CHOLHDL 4 08/24/2018   Lab Results  Component Value Date   ALT 21 08/24/2018   AST 18 08/24/2018   ALKPHOS 69 08/24/2018   BILITOT 0.4 08/24/2018

## 2019-02-22 NOTE — Assessment & Plan Note (Signed)
Annual PSA is due August 2

## 2019-02-22 NOTE — Progress Notes (Signed)
Virtual Visit via [email or web address]  This visit type was conducted due to national recommendations for restrictions regarding the COVID-19 pandemic (e.g. social distancing).  This format is felt to be most appropriate for this patient at this time.  All issues noted in this document were discussed and addressed.  No physical exam was performed (except for noted visual exam findings with Video Visits).   I connected with@ on 02/22/19 at  8:30 AM EDT by a video enabled telemedicine application or telephone and verified that I am speaking with the correct person using two identifiers. Location patient: home Location provider: work or home office Persons participating in the virtual visit: patient, provider  I discussed the limitations, risks, security and privacy concerns of performing an evaluation and management service by telephone and the availability of in person appointments. I also discussed with the patient that there may be a patient responsible charge related to this service. The patient expressed understanding and agreed to proceed.  Reason for visit: follow up on Type 2 DM, obesity   HPI:  typ 2 DM:  Tolerating Ozempic,  Finds that he has no appetite  No nausea.  Not exercising  Still indulging in oreos at night "but not as much"  No significant  weight change ,  Average BS 30 days is 162.  90 days 164    HTN: amlodipine and losartan in AM  Today's reading was 148/84.    r ear flaking,  Itching,  Needs fluocinolide oil precribed by [facility] in july 2018.  ROS: Patient denies headache, fevers, malaise, unintentional weight loss, skin rash, eye pain, sinus congestion and sinus pain, sore throat, dysphagia,  hemoptysis , cough, dyspnea, wheezing, chest pain, palpitations, orthopnea, edema, abdominal pain, nausea, melena, diarrhea, constipation, flank pain, dysuria, hematuria, urinary  Frequency, nocturia, numbness, tingling, seizures,  Focal weakness, Loss of consciousness,  Tremor, insomnia,  depression, anxiety, and suicidal ideation.     Past Medical History:  Diagnosis Date  . Diabetes mellitus   . Hemorrhoid   . Hemorrhoids   . Hyperlipidemia   . Hypertension   . Knee pain, left    episodic  . Obesity (BMI 30-39.9)   . Sleep apnea    Uses C-PAP machine    Past Surgical History:  Procedure Laterality Date  . ANOSCOPY  10 years ago   Hemorrhoids  . COLONOSCOPY WITH PROPOFOL N/A 02/28/2016   Procedure: COLONOSCOPY WITH PROPOFOL;  Surgeon: Robert Bellow, MD;  Location: [facility] ENDOSCOPY;  Service: Endoscopy;  Laterality: N/A;  . TONSILLECTOMY  1972    Family History  Problem Relation Age of Onset  . Transient ischemic attack Father   . CAD Mother 65  . Cancer Maternal Uncle        colon ca   . Heart disease Maternal Grandfather   . Hypertension Maternal Grandfather   . Heart attack Maternal Grandfather   . Hypertension Paternal Grandmother   . Hypertension Paternal Grandfather     SOCIAL HX:  reports that he has never smoked. He has never used smokeless tobacco. He reports current alcohol use. He reports that he does not use drugs.  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  amLODipine (NORVASC) 10 MG tablet, TAKE 1 TABLET(10 MG) BY MOUTH DAILY, Disp: 90 tablet, Rfl: 3 .  aspirin 81 MG chewable tablet, CHEW AND SWALLOW 1 TABLET BY MOUTH EVERY DAY, Disp: 90 tablet, Rfl: 1 .  atorvastatin (LIPITOR) 20 MG tablet, Take 1 tablet (20  mg total) by mouth daily., Disp: 90 tablet, Rfl: 2 .  Continuous Blood Gluc Sensor (FREESTYLE LIBRE 14 DAY SENSOR) MISC, by Other route every fourteen (14) days., Disp: , Rfl:  .  Continuous Blood Gluc Sensor (FREESTYLE LIBRE SENSOR SYSTEM) MISC, USE AS DIRECTED. CHANGE EVERY 10 DAYS, Disp: , Rfl:  .  JARDIANCE 25 MG TABS tablet, Take 25 mg by mouth daily., Disp: 90 tablet, Rfl: 0 .  losartan-hydrochlorothiazide (HYZAAR) 100-25 MG tablet, Take 1 tablet by mouth daily., Disp: 90 tablet, Rfl: 3 .   metFORMIN (GLUCOPHAGE-XR) 500 MG 24 hr tablet, Take 4 tablets (2,000 mg total) by mouth daily., Disp: 360 tablet, Rfl: 0 .  Multiple Vitamins-Minerals (MULTIVITAMIN WITH MINERALS) tablet, Take 1 tablet by mouth daily., Disp: , Rfl:  .  Semaglutide,0.25 or 0.5MG /DOS, (OZEMPIC, 0.25 OR 0.5 MG/DOSE,) 2 MG/1.5ML SOPN, Inject 0.5 mg into the skin once a week., Disp: 1 pen, Rfl: 2 .  Fluocinolone Acetonide 0.01 % OIL, Place 1 drop in ear(s) 2 (two) times daily as needed., Disp: 20 mL, Rfl: 0  EXAM:  VITALS per patient if applicable:  GENERAL: alert, oriented, appears well and in no acute distress  HEENT: atraumatic, conjunttiva clear, no obvious abnormalities on inspection of external nose and ears  NECK: normal movements of the head and neck  LUNGS: on inspection no signs of respiratory distress, breathing rate appears normal, no obvious gross SOB, gasping or wheezing  CV: no obvious cyanosis  MS: moves all visible extremities without noticeable abnormality  PSYCH/NEURO: pleasant and cooperative, no obvious depression or anxiety, speech and thought processing grossly intact  ASSESSMENT AND PLAN:   Obesity (BMI 30-39.9) I have addressed  BMI and recommended a low glycemic index diet utilizing smaller more frequent meals to increase metabolism.  I have also recommended that patient start exercising with a goal of 30 minutes of aerobic exercise a minimum of 5 days per week. Screening for lipid disorders, thyroid and diabetes to be done today.    Diabetes mellitus without complication His 30 day average is elevated at 162 on current regiemn of Jardiance 25 mg ,  Metformin and Ozempic  Asked to check fasting sugars daily for one week,  ,   Prostate cancer screening Annual PSA is due August 2   Dyslipidemia, goal LDL below 70 He has RESUMED  Lipitor  And LDL is at goal  LFTs are due. . Lab Results  Component Value Date   CHOL 153 08/24/2018   HDL 42.80 08/24/2018   LDLCALC 99  08/24/2018   LDLDIRECT 82 12/23/2014   TRIG 55.0 08/24/2018   CHOLHDL 4 08/24/2018   Lab Results  Component Value Date   ALT 21 08/24/2018   AST 18 08/24/2018   ALKPHOS 69 08/24/2018   BILITOT 0.4 08/24/2018       I discussed the assessment and treatment plan with the patient. The patient was provided an opportunity to ask questions and all were answered. The patient agreed with the plan and demonstrated an understanding of the instructions.   The patient was advised to call back or seek an in-person evaluation if the symptoms worsen or if the condition fails to improve as anticipated.  I provided 25 minutes of non-face-to-face time during this encounter.   Sherlene Shamseresa L Quintavious Rinck, MD

## 2019-02-22 NOTE — Assessment & Plan Note (Signed)
I have addressed  BMI and recommended a low glycemic index diet utilizing smaller more frequent meals to increase metabolism.  I have also recommended that patient start exercising with a goal of 30 minutes of aerobic exercise a minimum of 5 days per week. Screening for lipid disorders, thyroid and diabetes to be done today.   

## 2019-02-22 NOTE — Patient Instructions (Signed)
Please record your fasting blood sugars daily for one week  Please change your amlodipine to nighttime dosing and continue taking the losartan in the AM  Check BP once daily for one week   Send me BS and BP readings in one week  Fasting labs due on August 3 or after (including your annual  PSA)

## 2019-05-03 ENCOUNTER — Other Ambulatory Visit: Payer: Self-pay

## 2019-05-05 ENCOUNTER — Telehealth: Payer: Self-pay

## 2019-05-05 ENCOUNTER — Other Ambulatory Visit: Payer: Self-pay | Admitting: Internal Medicine

## 2019-05-05 ENCOUNTER — Other Ambulatory Visit: Payer: Self-pay

## 2019-05-05 ENCOUNTER — Encounter: Payer: Self-pay | Admitting: Internal Medicine

## 2019-05-05 ENCOUNTER — Ambulatory Visit (INDEPENDENT_AMBULATORY_CARE_PROVIDER_SITE_OTHER): Payer: BC Managed Care – PPO | Admitting: Internal Medicine

## 2019-05-05 DIAGNOSIS — R51 Headache: Secondary | ICD-10-CM

## 2019-05-05 DIAGNOSIS — R519 Headache, unspecified: Secondary | ICD-10-CM | POA: Insufficient documentation

## 2019-05-05 DIAGNOSIS — Z20822 Contact with and (suspected) exposure to covid-19: Secondary | ICD-10-CM

## 2019-05-05 NOTE — Telephone Encounter (Signed)
mychart message sent letting pt know that we need to change appt to a virtual visit due to symptom he is having. Could not reach pt on phone and mailbox is full.

## 2019-05-05 NOTE — Progress Notes (Signed)
Virtual Visit via [email or web address]  This visit type was conducted due to national recommendations for restrictions regarding the COVID-19 pandemic (e.g. social distancing).  This format is felt to be most appropriate for this patient at this time.  All issues noted in this document were discussed and addressed.  No physical exam was performed (except for noted visual exam findings with Video Visits).   I connected with@ on 05/05/19 at  1:30 PM EDT by a video enabled telemedicine application  . however  Interactive audio and video telecommunications  Ultimately failed, due to patient having technical difficulties.   We continued and completed visit with audio only. I and verified that I am speaking with the correct person using two identifiers. .  Location patient: car Location provider: work or home office Persons participating in the virtual visit: patient, provider  I discussed the limitations, risks, security and privacy concerns of performing an evaluation and management service by telephone and the availability of in person appointments. I also discussed with the patient that there may be a patient responsible charge related to this service. The patient expressed understanding and agreed to proceed.  Reason for visit: new onset headaches   HPI:  53 yr old white male with no history of headache disorders presents  with 3 week history of occipital headaches. Headaches initially occurred in the evening ,  Every 2 o r 3 days,  Then after about he week he started to have morning headaches as well.  For the past week he has had a constant headache that has never resolved,  Only lessened in intensity with 600 mg motrin. BP checked and normal 120/80.  Blood sugars normal.  Denies neck pain  Fevers,  Body aches,  Vision changes.  Up to date on eye exam.  Spends 8 to 10 hours daily looking at computer screen and drinks 2 3 liter bottles of diet soda daily (all activities are habitual, not new).  Recent weight  loss of 15 lbs since starting ozempic due to loss of appetite . No nausea vomiting or dizziness. 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    ROS: See pertinent positives and negatives per HPI.  Past Medical History:  Diagnosis Date  . Diabetes mellitus   . Hemorrhoid   . Hemorrhoids   . Hyperlipidemia   . Hypertension   . Knee pain, left    episodic  . Obesity (BMI 30-39.9)   . Sleep apnea    Uses C-PAP machine    Past Surgical History:  Procedure Laterality Date  . ANOSCOPY  10 years ago   Hemorrhoids  . COLONOSCOPY WITH PROPOFOL N/A 02/28/2016   Procedure: COLONOSCOPY WITH PROPOFOL;  Surgeon: Earline Mayotte, MD;  Location: [facility] ENDOSCOPY;  Service: Endoscopy;  Laterality: N/A;  . TONSILLECTOMY  1972    Family History  Problem Relation Age of Onset  . Transient ischemic attack Father   . CAD Mother 65  . Cancer Maternal Uncle        colon ca   . Heart disease Maternal Grandfather   . Hypertension Maternal Grandfather   . Heart attack Maternal Grandfather   . Hypertension Paternal Grandmother   . Hypertension Paternal Grandfather     SOCIAL HX:  reports that he has never smoked. He has never used smokeless tobacco. He reports current alcohol use. He reports that he does not use drugs.   Current Outpatient Medications:  .  amLODipine (NORVASC) 10 MG tablet, TAKE 1 TABLET(10 MG) BY MOUTH DAILY, Disp: 90 tablet, Rfl: 3 .  aspirin 81 MG chewable tablet, CHEW AND SWALLOW 1 TABLET BY MOUTH EVERY DAY, Disp: 90 tablet, Rfl: 1 .  atorvastatin (LIPITOR) 20 MG tablet, Take 1 tablet (20 mg total) by mouth daily., Disp: 90 tablet, Rfl: 2 .  Continuous Blood Gluc Sensor (FREESTYLE LIBRE 14 DAY SENSOR) MISC, by Other route every fourteen (14) days., Disp: , Rfl:  .  Continuous Blood Gluc Sensor (FREESTYLE LIBRE SENSOR SYSTEM) MISC, USE AS DIRECTED. CHANGE EVERY 10 DAYS, Disp: , Rfl:  .  Fluocinolone Acetonide  0.01 % OIL, Place 1 drop in ear(s) 2 (two) times daily as needed., Disp: 20 mL, Rfl: 0 .  JARDIANCE 25 MG TABS tablet, Take 25 mg by mouth daily., Disp: 90 tablet, Rfl: 0 .  losartan-hydrochlorothiazide (HYZAAR) 100-25 MG tablet, Take 1 tablet by mouth daily., Disp: 90 tablet, Rfl: 3 .  metFORMIN (GLUCOPHAGE-XR) 500 MG 24 hr tablet, Take 4 tablets (2,000 mg total) by mouth daily., Disp: 360 tablet, Rfl: 0 .  Multiple Vitamins-Minerals (MULTIVITAMIN WITH MINERALS) tablet, Take 1 tablet by mouth daily., Disp: , Rfl:  .  Semaglutide,0.25 or 0.5MG /DOS, (OZEMPIC, 0.25 OR 0.5 MG/DOSE,) 2 MG/1.5ML SOPN, Inject 0.5 mg into the skin once a week., Disp: 1 pen, Rfl: 2 .  tacrolimus (PROTOPIC) 0.1 % ointment, Apply topically as needed., Disp: , Rfl:   EXAM:  General impression: alert, cooperative and articulate.  No signs of being in distress  Lungs: speech is fluent sentence length suggests that patient is not short of breath and not punctuated by cough, sneezing or sniffing. Marland Kitchen   Psych: affect normal.  speech is articulate and non pressured .  Denies suicidal thoughts  ASSESSMENT AND PLAN:  Discussed the following assessment and plan:  Headache in back of head  Headache in back of head Suspect due to viral infection.  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 . If negative will need MRI brain .   Diabetes mellitus without complication With obesity. Improving control since starting Ozempci and 15 lb weight loss noted. a1c is due     I discussed the assessment and treatment plan with the patient. The patient was provided an opportunity to ask questions and all were answered. The patient agreed with the plan and demonstrated an understanding of the instructions.   The patient was advised to call back or seek an in-person evaluation if the symptoms worsen or if the condition fails to improve as anticipated.  I provided  15 minutes of  non-face-to-face time during this encounter reviewing patient's current problems and post surgeries.  Providing counseling on the above mentioned problems , and coordination  of care .  Crecencio Mc, MD

## 2019-05-05 NOTE — Assessment & Plan Note (Signed)
With obesity. Improving control since starting Ozempci and 15 lb weight loss noted. a1c is due

## 2019-05-05 NOTE — Assessment & Plan Note (Signed)
Suspect due to viral infection.  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 . If negative will need MRI brain .

## 2019-05-06 LAB — NOVEL CORONAVIRUS, NAA: SARS-CoV-2, NAA: NOT DETECTED

## 2019-05-13 ENCOUNTER — Other Ambulatory Visit: Payer: Self-pay | Admitting: Internal Medicine

## 2019-05-13 DIAGNOSIS — G44201 Tension-type headache, unspecified, intractable: Secondary | ICD-10-CM

## 2019-05-20 ENCOUNTER — Telehealth: Payer: Self-pay

## 2019-05-20 NOTE — Telephone Encounter (Signed)
Copied from [place] [id number]. Topic: General - Other >> May 20, 2019  4:36 PM Rainey Pines A wrote: Patient requesting callback from [facility] once she is in office in regards to scheduling his MRI

## 2019-05-26 ENCOUNTER — Ambulatory Visit
Admission: RE | Admit: 2019-05-26 | Discharge: 2019-05-26 | Disposition: A | Discharge status: Home / Self Care | Payer: BC Managed Care – PPO | Source: Ambulatory Visit | Attending: Internal Medicine | Admitting: Internal Medicine

## 2019-05-26 ENCOUNTER — Other Ambulatory Visit: Payer: Self-pay

## 2019-05-26 DIAGNOSIS — G44201 Tension-type headache, unspecified, intractable: Secondary | ICD-10-CM | POA: Diagnosis not present

## 2019-05-27 DIAGNOSIS — G441 Vascular headache, not elsewhere classified: Secondary | ICD-10-CM

## 2019-05-28 ENCOUNTER — Encounter: Payer: Self-pay | Admitting: Neurology

## 2019-06-01 ENCOUNTER — Telehealth: Payer: Self-pay | Admitting: *Deleted

## 2019-06-01 NOTE — Telephone Encounter (Signed)
Copied from [place] [id number]. Topic: General - Inquiry >> Jun 01, 2019  4:55 PM Rutherford Nail, NT wrote: Reason for CRM: Patient calling and states that he has not heard anything about scheduling an MRA. States that Dr Derrel Nip advised him that if he had not heard anything to reach out. Please advise.

## 2019-06-02 NOTE — Telephone Encounter (Signed)
He has been scheduled-mychart sent to him

## 2019-06-12 ENCOUNTER — Ambulatory Visit
Admission: RE | Admit: 2019-06-12 | Discharge: 2019-06-12 | Disposition: A | Discharge status: Home / Self Care | Payer: BC Managed Care – PPO | Source: Ambulatory Visit | Attending: Internal Medicine | Admitting: Internal Medicine

## 2019-06-12 DIAGNOSIS — G441 Vascular headache, not elsewhere classified: Secondary | ICD-10-CM | POA: Diagnosis not present

## 2019-06-24 NOTE — Progress Notes (Signed)
NEUROLOGY CONSULTATION NOTE  [name] MRN: [id number] DOB: 10-01-1965  Referring provider: Deborra Medina, MD Primary care provider: Deborra Medina, MD  Reason for consult:  headache  HISTORY OF PRESENT ILLNESS: Joshua Arellano is a 53 year old male who presents for headache.  History supplemented by referring provider note.  MRI and MRA of brain personally reviewed.    In September, he began having occipital headaches initially occurring in the evening about every 2 or 3 days.  No preceding head injury.  He did get new eyeglasses in August.  After a week, they also started occurring in the morning.  The following week, they became persistent.  Headaches are now described as mild to moderate nonthrobbing headache.  There is no associated neck pain, nausea, vomiting, dizziness, photophobia, phonophobia, visual changes, fever, speech disturbance or unilateral numbness or weakness.  They have started to ease off, now every couple of days for the past 2 weeks usually mild, lasting all day.    MRI of brain without contrast from 05/27/2019 was unremarkable except for tubular structure in the supra vermian cistern, possibly a vein.  Follow up MRA of head from 06/13/2019 revealed no arterial-like flow related enhancement, suggesting a vein.  No aneurysm or other acute intracranial abnormality noted.  He reports an occasional headache but not a significant history.  Current NSAIDS:  ASA 81mg  daily, ibuprofen Current analgesics:  Tylenol Current triptans:  none Current ergotamine:  none Current anti-emetic:  none Current muscle relaxants:  none Current anti-anxiolytic:  none Current sleep aide:  none Current Antihypertensive medications:  Amlodipine, Hyzaar Current Antidepressant medications:  none Current Anticonvulsant medications:  none Current anti-CGRP:  none Current Vitamins/Herbal/Supplements:  MVI Current Antihistamines/Decongestants:  none Other therapy:  none  Past NSAIDS:   none Past analgesics:  none Past abortive triptans:  none Past abortive ergotamine:  none Past muscle relaxants:  none Past anti-emetic:  none Past antihypertensive medications:  none Past antidepressant medications:  none Past anticonvulsant medications:  none Past anti-CGRP:  none Past vitamins/Herbal/Supplements:  none Past antihistamines/decongestants:  none Other past therapies:  none    PAST MEDICAL HISTORY: Past Medical History:  Diagnosis Date  . Diabetes mellitus   . Hemorrhoid   . Hemorrhoids   . Hyperlipidemia   . Hypertension   . Knee pain, left    episodic  . Obesity (BMI 30-39.9)   . Sleep apnea    Uses C-PAP machine    PAST SURGICAL HISTORY: Past Surgical History:  Procedure Laterality Date  . ANOSCOPY  10 years ago   Hemorrhoids  . COLONOSCOPY WITH PROPOFOL N/A 02/28/2016   Procedure: COLONOSCOPY WITH PROPOFOL;  Surgeon: Robert Bellow, MD;  Location: [facility] ENDOSCOPY;  Service: Endoscopy;  Laterality: N/A;  . TONSILLECTOMY  1972    MEDICATIONS: Current Outpatient Medications on File Prior to Visit  Medication Sig Dispense Refill  . amLODipine (NORVASC) 10 MG tablet TAKE 1 TABLET(10 MG) BY MOUTH DAILY 90 tablet 3  . aspirin 81 MG chewable tablet CHEW AND SWALLOW 1 TABLET BY MOUTH EVERY DAY 90 tablet 1  . atorvastatin (LIPITOR) 20 MG tablet Take 1 tablet (20 mg total) by mouth daily. 90 tablet 2  . Continuous Blood Gluc Sensor (FREESTYLE LIBRE 14 DAY SENSOR) MISC by Other route every fourteen (14) days.    . Continuous Blood Gluc Sensor (FREESTYLE LIBRE SENSOR SYSTEM) MISC USE AS DIRECTED. CHANGE EVERY 10 DAYS    . Fluocinolone Acetonide 0.01 % OIL Place 1 drop  in ear(s) 2 (two) times daily as needed. 20 mL 0  . JARDIANCE 25 MG TABS tablet Take 25 mg by mouth daily. 90 tablet 0  . losartan-hydrochlorothiazide (HYZAAR) 100-25 MG tablet Take 1 tablet by mouth daily. 90 tablet 3  . metFORMIN (GLUCOPHAGE-XR) 500 MG 24 hr tablet Take 4 tablets (2,000 mg  total) by mouth daily. 360 tablet 0  . Multiple Vitamins-Minerals (MULTIVITAMIN WITH MINERALS) tablet Take 1 tablet by mouth daily.    . Semaglutide,0.25 or 0.5MG /DOS, (OZEMPIC, 0.25 OR 0.5 MG/DOSE,) 2 MG/1.5ML SOPN Inject 0.5 mg into the skin once a week. 1 pen 2  . tacrolimus (PROTOPIC) 0.1 % ointment Apply topically as needed.     No current facility-administered medications on file prior to visit.     ALLERGIES: Allergies  Allergen Reactions  . Lisinopril Cough    cough  . Latex Itching    FAMILY HISTORY: Family History  Problem Relation Age of Onset  . Transient ischemic attack Father   . CAD Mother 7  . Cancer Maternal Uncle        colon ca   . Heart disease Maternal Grandfather   . Hypertension Maternal Grandfather   . Heart attack Maternal Grandfather   . Hypertension Paternal Grandmother   . Hypertension Paternal Grandfather    SOCIAL HISTORY: Social History   Socioeconomic History  . Marital status: Married    Spouse name: Not on file  . Number of children: Not on file  . Years of education: Not on file  . Highest education level: Not on file  Occupational History  . Not on file  Social Needs  . Financial resource strain: Not on file  . Food insecurity    Worry: Not on file    Inability: Not on file  . Transportation needs    Medical: Not on file    Non-medical: Not on file  Tobacco Use  . Smoking status: Never Smoker  . Smokeless tobacco: Never Used  Substance and Sexual Activity  . Alcohol use: Yes    Comment: occasional  . Drug use: No  . Sexual activity: Not on file  Lifestyle  . Physical activity    Days per week: Not on file    Minutes per session: Not on file  . Stress: Not on file  Relationships  . Social Musician on phone: Not on file    Gets together: Not on file    Attends religious service: Not on file    Active member of club or organization: Not on file    Attends meetings of clubs or organizations: Not on file     Relationship status: Not on file  . Intimate partner violence    Fear of current or ex partner: Not on file    Emotionally abused: Not on file    Physically abused: Not on file    Forced sexual activity: Not on file  Other Topics Concern  . Not on file  Social History Narrative  . Not on file    REVIEW OF SYSTEMS: Constitutional: No fevers, chills, or sweats, no generalized fatigue, change in appetite Eyes: No visual changes, double vision, eye pain Ear, nose and throat: No hearing loss, ear pain, nasal congestion, sore throat Cardiovascular: No chest pain, palpitations Respiratory:  No shortness of breath at rest or with exertion, wheezes GastrointestinaI: No nausea, vomiting, diarrhea, abdominal pain, fecal incontinence Genitourinary:  No dysuria, urinary retention or frequency Musculoskeletal:  No neck pain,  back pain Integumentary: No rash, pruritus, skin lesions Neurological: as above Psychiatric: No depression, insomnia, anxiety Endocrine: No palpitations, fatigue, diaphoresis, mood swings, change in appetite, change in weight, increased thirst Hematologic/Lymphatic:  No purpura, petechiae. Allergic/Immunologic: no itchy/runny eyes, nasal congestion, recent allergic reactions, rashes  PHYSICAL EXAM: Blood pressure 132/81, pulse 86, height 5\' 10"  (1.778 m), weight 214 lb (97.1 kg), SpO2 96 %. General: No acute distress.  Patient appears well-groomed.   Head:  Normocephalic/atraumatic Eyes:  fundi examined but not visualized Neck: supple, no paraspinal tenderness, full range of motion Back: No paraspinal tenderness Heart: regular rate and rhythm Lungs: Clear to auscultation bilaterally. Vascular: No carotid bruits. Neurological Exam: Mental status: alert and oriented to person, place, and time, recent and remote memory intact, fund of knowledge intact, attention and concentration intact, speech fluent and not dysarthric, language intact. Cranial nerves: CN I: not tested  CN II: pupils equal, round and reactive to light, visual fields intact CN III, IV, VI:  full range of motion, no nystagmus, no ptosis CN V: facial sensation intact CN VII: upper and lower face symmetric CN VIII: hearing intact CN IX, X: gag intact, uvula midline CN XI: sternocleidomastoid and trapezius muscles intact CN XII: tongue midline Bulk & Tone: normal, no fasciculations. Motor:  5/5 throughout  Sensation:  temperature and vibration sensation intact.   Deep Tendon Reflexes:  2+ throughout, toes downgoing.   Finger to nose testing:  Without dysmetria.   Heel to shin:  Without dysmetria.   Gait:  Normal station and stride.  Romberg negative  IMPRESSION: Persistent occipital headache (possibly tension-type headache), now improving.  PLAN: 1.  Defer preventative for now.  If does not continue to improve over the next 2 weeks, will start nortriptyline 10mg  at bedtime 2.  Limit use of pain relievers to no more than 2 days out of week to prevent risk of rebound or medication-overuse headache. 3.  Keep headache diary 4.  Follow up in 4 months.  Thank you for allowing me to take part in the care of this patient.  Shon MilletAdam Brooke Steinhilber, DO  CC: Duncan Dulleresa Tullo, MD

## 2019-06-25 ENCOUNTER — Ambulatory Visit (INDEPENDENT_AMBULATORY_CARE_PROVIDER_SITE_OTHER): Payer: BC Managed Care – PPO | Admitting: Neurology

## 2019-06-25 ENCOUNTER — Other Ambulatory Visit: Payer: Self-pay

## 2019-06-25 ENCOUNTER — Encounter: Payer: Self-pay | Admitting: Neurology

## 2019-06-25 VITALS — BP 132/81 | HR 86 | Ht 70.0 in | Wt 214.0 lb

## 2019-06-25 DIAGNOSIS — G44209 Tension-type headache, unspecified, not intractable: Secondary | ICD-10-CM | POA: Diagnosis not present

## 2019-06-25 DIAGNOSIS — R519 Headache, unspecified: Secondary | ICD-10-CM | POA: Diagnosis not present

## 2019-06-25 NOTE — Patient Instructions (Signed)
1.  If headaches do not improve within 2 weeks, contact me and we can start a daily preventative. 2.  Limit use of pain relievers to no more than 2 days out of week to prevent risk of rebound or medication-overuse headache. 3.  Follow up in 4 months.

## 2019-09-20 ENCOUNTER — Other Ambulatory Visit: Payer: Self-pay

## 2019-09-20 NOTE — Telephone Encounter (Signed)
Refilled: 02/22/2019 Last OV: 05/05/2019 Next OV: not scheduled

## 2019-09-21 MED ORDER — FLUOCINOLONE ACETONIDE 0.01 % OT OIL: 1.0000 [drp] | TOPICAL_OIL | Freq: Two times a day (BID) | OTIC | 0 refills | Status: DC | PRN

## 2019-11-11 ENCOUNTER — Ambulatory Visit: Payer: BC Managed Care – PPO | Admitting: Neurology

## 2020-01-10 DIAGNOSIS — I10 Essential (primary) hypertension: Secondary | ICD-10-CM

## 2020-01-10 DIAGNOSIS — Z0000 Encounter for general adult medical examination without abnormal findings: Secondary | ICD-10-CM

## 2020-01-10 DIAGNOSIS — E785 Hyperlipidemia, unspecified: Secondary | ICD-10-CM

## 2020-01-10 DIAGNOSIS — E119 Type 2 diabetes mellitus without complications: Secondary | ICD-10-CM

## 2020-01-10 DIAGNOSIS — Z125 Encounter for screening for malignant neoplasm of prostate: Secondary | ICD-10-CM

## 2020-01-11 LAB — HM DIABETES EYE EXAM

## 2020-01-13 ENCOUNTER — Other Ambulatory Visit: Payer: Self-pay

## 2020-01-13 DIAGNOSIS — E119 Type 2 diabetes mellitus without complications: Secondary | ICD-10-CM

## 2020-01-13 MED ORDER — ATORVASTATIN CALCIUM 20 MG PO TABS: 20.0000 mg | ORAL_TABLET | Freq: Every day | ORAL | 0 refills | Status: DC

## 2020-01-14 NOTE — Telephone Encounter (Signed)
Pt would like to have labs drawn prior to physical. I have ordered PSA, CBC, CMP, A1c, lipid and microalbumin. Is there anything else that needs to be ordered?

## 2020-01-17 ENCOUNTER — Other Ambulatory Visit: Payer: Self-pay

## 2020-01-17 ENCOUNTER — Other Ambulatory Visit (INDEPENDENT_AMBULATORY_CARE_PROVIDER_SITE_OTHER): Payer: BC Managed Care – PPO

## 2020-01-17 DIAGNOSIS — I10 Essential (primary) hypertension: Secondary | ICD-10-CM

## 2020-01-17 DIAGNOSIS — Z125 Encounter for screening for malignant neoplasm of prostate: Secondary | ICD-10-CM

## 2020-01-17 DIAGNOSIS — E785 Hyperlipidemia, unspecified: Secondary | ICD-10-CM | POA: Diagnosis not present

## 2020-01-17 DIAGNOSIS — Z0000 Encounter for general adult medical examination without abnormal findings: Secondary | ICD-10-CM | POA: Diagnosis not present

## 2020-01-17 DIAGNOSIS — E119 Type 2 diabetes mellitus without complications: Secondary | ICD-10-CM | POA: Diagnosis not present

## 2020-01-17 LAB — CBC WITH DIFFERENTIAL/PLATELET
Basophils Absolute: 0 10*3/uL (ref 0.0–0.1)
Basophils Relative: 0.6 % (ref 0.0–3.0)
Eosinophils Absolute: 0.1 10*3/uL (ref 0.0–0.7)
Eosinophils Relative: 1.2 % (ref 0.0–5.0)
HCT: 44 % (ref 39.0–52.0)
Hemoglobin: 15.2 g/dL (ref 13.0–17.0)
Lymphocytes Relative: 24.3 % (ref 12.0–46.0)
Lymphs Abs: 1.8 10*3/uL (ref 0.7–4.0)
MCHC: 34.7 g/dL (ref 30.0–36.0)
MCV: 86.2 fl (ref 78.0–100.0)
Monocytes Absolute: 0.6 10*3/uL (ref 0.1–1.0)
Monocytes Relative: 8.2 % (ref 3.0–12.0)
Neutro Abs: 5 10*3/uL (ref 1.4–7.7)
Neutrophils Relative %: 65.7 % (ref 43.0–77.0)
Platelets: 321 10*3/uL (ref 150.0–400.0)
RBC: 5.1 Mil/uL (ref 4.22–5.81)
RDW: 13.4 % (ref 11.5–15.5)
WBC: 7.6 10*3/uL (ref 4.0–10.5)

## 2020-01-17 LAB — LIPID PANEL
Cholesterol: 142 mg/dL (ref 0–200)
HDL: 37.7 mg/dL — ABNORMAL LOW (ref >39.00)
LDL Cholesterol: 81 mg/dL (ref 0–99)
NonHDL: 103.89
Total CHOL/HDL Ratio: 4
Triglycerides: 115 mg/dL (ref 0.0–149.0)
VLDL: 23 mg/dL (ref 0.0–40.0)

## 2020-01-17 LAB — COMPREHENSIVE METABOLIC PANEL
ALT: 24 U/L (ref 0–53)
AST: 18 U/L (ref 0–37)
Albumin: 4.5 g/dL (ref 3.5–5.2)
Alkaline Phosphatase: 72 U/L (ref 39–117)
BUN: 10 mg/dL (ref 6–23)
CO2: 30 mEq/L (ref 19–32)
Calcium: 10.6 mg/dL — ABNORMAL HIGH (ref 8.4–10.5)
Chloride: 102 mEq/L (ref 96–112)
Creatinine, Ser: 0.85 mg/dL (ref 0.40–1.50)
GFR: 93.27 mL/min (ref >60.00)
Glucose, Bld: 144 mg/dL — ABNORMAL HIGH (ref 70–99)
Potassium: 4.6 mEq/L (ref 3.5–5.1)
Sodium: 139 mEq/L (ref 135–145)
Total Bilirubin: 0.5 mg/dL (ref 0.2–1.2)
Total Protein: 6.6 g/dL (ref 6.0–8.3)

## 2020-01-17 LAB — MICROALBUMIN / CREATININE URINE RATIO
Creatinine,U: 37.1 mg/dL
Microalb Creat Ratio: 1.9 mg/g (ref 0.0–30.0)
Microalb, Ur: <0.7 mg/dL (ref 0.0–1.9)

## 2020-01-17 LAB — PSA: PSA: 0.46 ng/mL (ref 0.10–4.00)

## 2020-01-17 LAB — HEMOGLOBIN A1C: Hgb A1c MFr Bld: 7 % — ABNORMAL HIGH (ref 4.6–6.5)

## 2020-01-18 LAB — HM DIABETES EYE EXAM

## 2020-01-24 ENCOUNTER — Other Ambulatory Visit: Payer: Self-pay

## 2020-01-24 ENCOUNTER — Encounter: Payer: Self-pay | Admitting: Internal Medicine

## 2020-01-24 ENCOUNTER — Ambulatory Visit (INDEPENDENT_AMBULATORY_CARE_PROVIDER_SITE_OTHER): Payer: BC Managed Care – PPO | Admitting: Internal Medicine

## 2020-01-24 VITALS — BP 130/84 | HR 94 | Temp 97.9°F | Resp 16 | Ht 70.5 in | Wt 203.0 lb

## 2020-01-24 DIAGNOSIS — E669 Obesity, unspecified: Secondary | ICD-10-CM | POA: Diagnosis not present

## 2020-01-24 DIAGNOSIS — E785 Hyperlipidemia, unspecified: Secondary | ICD-10-CM

## 2020-01-24 DIAGNOSIS — I10 Essential (primary) hypertension: Secondary | ICD-10-CM

## 2020-01-24 DIAGNOSIS — E1169 Type 2 diabetes mellitus with other specified complication: Secondary | ICD-10-CM

## 2020-01-24 NOTE — Progress Notes (Signed)
Patient ID: Joshua Arellano, male    DOB: [birth year]  Age: 54 y.o. MRN: [id number]  The patient is here for annual preventive examination and management of other chronic and acute problems.  This visit occurred during the SARS-CoV-2 public health emergency.  Safety protocols were in place, including screening questions prior to the visit, additional usage of staff PPE, and extensive cleaning of exam room while observing appropriate contact time as indicated for disinfecting solutions.    Patient has received NO  doses of the available COVID 19 vaccines.   Patient continues to mask when outside of the home except when walking in yard or at safe distances from others .  Patient denies any change in mood or development of unhealthy behaviors resuting from the pandemic's restriction of activities and socialization.     The risk factors are reflected in the social history.  The roster of all physicians providing medical care to patient - is listed in the Snapshot section of the chart.  Activities of daily living:  The patient is 100% independent in all ADLs: dressing, toileting, feeding as well as independent mobility  Home safety : The patient has smoke detectors in the home. They wear seatbelts.  There are no firearms at home. There is no violence in the home.   There is no risks for hepatitis, STDs or HIV. There is no   history of blood transfusion. They have no travel history to infectious disease endemic areas of the world.  The patient has seen their dentist in the last six month. They have seen their eye doctor in the last year. They admit to slight hearing difficulty with regard to whispered voices and some television programs.  They have deferred audiologic testing in the last year.  They do not  have excessive sun exposure. Discussed the need for sun protection: hats, long sleeves and use of sunscreen if there is significant sun exposure.   Diet: the importance of a healthy diet is  discussed. They do have a healthy diet.  The benefits of regular aerobic exercise were discussed. She walks 4 times per week ,  20 minutes.   Depression screen: there are no signs or vegative symptoms of depression- irritability, change in appetite, anhedonia, sadness/tearfullness.  Cognitive assessment: the patient manages all their financial and personal affairs and is actively engaged. They could relate day,date,year and events; recalled 2/3 objects at 3 minutes; performed clock-face test normally.  The following portions of the patient's history were reviewed and updated as appropriate: allergies, current medications, past family history, past medical history,  past surgical history, past social history  and problem list.  Visual acuity was not assessed per patient preference since she has regular follow up with her ophthalmologist. Hearing and body mass index were assessed and reviewed.   During the course of the visit the patient was educated and counseled about appropriate screening and preventive services including : fall prevention , diabetes screening, nutrition counseling, colorectal cancer screening, and recommended immunizations.    CC: The primary encounter diagnosis was Diabetes mellitus type 2 in obese (HCC). Diagnoses of Dyslipidemia, goal LDL below 70 and Essential hypertension were also pertinent to this visit.  1) recent hemorrhoid flare. Used prep h   2) jaw popping on the left  And painful  Now resolved.   [name] has a past medical history of Diabetes mellitus, Hemorrhoid, Hemorrhoids, Hyperlipidemia, Hypertension, Knee pain, left, Obesity (BMI 30-39.9), and Sleep apnea.   He has a past surgical  history that includes Tonsillectomy (1972); Anoscopy (10 years ago); and Colonoscopy with propofol (N/A, 02/28/2016).   His family history includes CAD (age of onset: 47) in his mother; Cancer in his maternal uncle; Heart attack in his maternal grandfather; Heart disease  in his maternal grandfather; Hypertension in his maternal grandfather, paternal grandfather, and paternal grandmother; Transient ischemic attack in his father.He reports that he has never smoked. He has never used smokeless tobacco. He reports current alcohol use. He reports that he does not use drugs.  Outpatient Medications Prior to Visit  Medication Sig Dispense Refill  . amLODipine (NORVASC) 10 MG tablet TAKE 1 TABLET(10 MG) BY MOUTH DAILY 90 tablet 3  . aspirin 81 MG chewable tablet CHEW AND SWALLOW 1 TABLET BY MOUTH EVERY DAY 90 tablet 1  . atorvastatin (LIPITOR) 20 MG tablet Take 1 tablet (20 mg total) by mouth daily. 90 tablet 0  . Continuous Blood Gluc Sensor (FREESTYLE LIBRE 14 DAY SENSOR) MISC by Other route every fourteen (14) days.    . Fluocinolone Acetonide 0.01 % OIL Place 1 drop in ear(s) 2 (two) times daily as needed. 20 mL 0  . JARDIANCE 25 MG TABS tablet Take 25 mg by mouth daily. 90 tablet 0  . losartan-hydrochlorothiazide (HYZAAR) 100-25 MG tablet Take 1 tablet by mouth daily. 90 tablet 3  . metFORMIN (GLUCOPHAGE-XR) 500 MG 24 hr tablet Take 4 tablets (2,000 mg total) by mouth daily. 360 tablet 0  . Multiple Vitamins-Minerals (MULTIVITAMIN WITH MINERALS) tablet Take 1 tablet by mouth daily.    . Semaglutide,0.25 or 0.5MG /DOS, (OZEMPIC, 0.25 OR 0.5 MG/DOSE,) 2 MG/1.5ML SOPN Inject 0.5 mg into the skin once a week. 1 pen 2  . tacrolimus (PROTOPIC) 0.1 % ointment Apply topically as needed.    . Continuous Blood Gluc Sensor (FREESTYLE LIBRE SENSOR SYSTEM) MISC USE AS DIRECTED. CHANGE EVERY 10 DAYS     No facility-administered medications prior to visit.    Review of Systems  Objective:  BP 130/84 (BP Location: Left Arm, Patient Position: Sitting, Cuff Size: Normal)   Pulse 94   Temp 97.9 F (36.6 C) (Temporal)   Resp 16   Ht 5' 10.5" (1.791 m)   Wt 203 lb (92.1 kg)   SpO2 97%   BMI 28.72 kg/m   Physical Exam    Assessment & Plan:   Problem List Items  Addressed This Visit      Unprioritized   Diabetes mellitus type 2 in obese ([place]) - Primary    Improved control on current regimen.  Encouraged to exercise regularly to improve insulin sensitivity glycemic control and address obesity.  No changes today to regimen  Lab Results  Component Value Date   HGBA1C 7.0 (H) 01/17/2020         Dyslipidemia, goal LDL below 70    He has RESUMED  Lipitor  And LDL is at goal  LFTs are normal.  . Lab Results  Component Value Date   CHOL 142 01/17/2020   HDL 37.70 (L) 01/17/2020   LDLCALC 81 01/17/2020   LDLDIRECT 82 12/23/2014   TRIG 115.0 01/17/2020   CHOLHDL 4 01/17/2020   Lab Results  Component Value Date   ALT 24 01/17/2020   AST 18 01/17/2020   ALKPHOS 72 01/17/2020   BILITOT 0.5 01/17/2020         Hypertension    Well controlled on current regimen of amlodipine, losartan and hct. Renal function stable, no changes today.  Lab Results  Component Value  Date   CREATININE 0.85 01/17/2020   Lab Results  Component Value Date   NA 139 01/17/2020   K 4.6 01/17/2020   CL 102 01/17/2020   CO2 30 01/17/2020            I have discontinued Cindie Crumbly. Suh's FreeStyle [employer]. I am also having him maintain his multivitamin with minerals, FreeStyle Libre 14 Day Sensor, Jardiance, metFORMIN, aspirin, Ozempic (0.25 or 0.5 MG/DOSE), amLODipine, losartan-hydrochlorothiazide, tacrolimus, Fluocinolone Acetonide, and atorvastatin.  No orders of the defined types were placed in this encounter.   Medications Discontinued During This Encounter  Medication Reason  . Continuous Blood Gluc Sensor (FREESTYLE LIBRE SENSOR SYSTEM) MISC Error    Follow-up: Return in about 6 months (around 07/25/2020) for follow up diabetes.   Sherlene Shams, MD

## 2020-01-24 NOTE — Patient Instructions (Addendum)
.Breakfast choices:  Joshua Arellano now makes a frozen breakfast frittata that can be microwaved in 2 minutes and is very low carb. Frittats are similar to quiches without the crust.  Also the Eggwhich !    Healthy Choice low carb power bowl entrees and FITKITCHEN cauliflower pizza bowls are great low carb entrees that microwave in 5 minutes      For a vitamin D level of 22 ,  I recommend 2000 to 3000 Ius daily  Total   Return in 6 months    Health Maintenance, Male Adopting a healthy lifestyle and getting preventive care are important in promoting health and wellness. Ask your health care provider about:  The right schedule for you to have regular tests and exams.  Things you can do on your own to prevent diseases and keep yourself healthy. What should I know about diet, weight, and exercise? Eat a healthy diet   Eat a diet that includes plenty of vegetables, fruits, low-fat dairy products, and lean protein.  Do not eat a lot of foods that are high in solid fats, added sugars, or sodium. Maintain a healthy weight Body mass index (BMI) is a measurement that can be used to identify possible weight problems. It estimates body fat based on height and weight. Your health care provider can help determine your BMI and help you achieve or maintain a healthy weight. Get regular exercise Get regular exercise. This is one of the most important things you can do for your health. Most adults should:  Exercise for at least 150 minutes each week. The exercise should increase your heart rate and make you sweat (moderate-intensity exercise).  Do strengthening exercises at least twice a week. This is in addition to the moderate-intensity exercise.  Spend less time sitting. Even light physical activity can be beneficial. Watch cholesterol and blood lipids Have your blood tested for lipids and cholesterol at 56 years of age, then have this test every 5 years. You may need to have your cholesterol  levels checked more often if:  Your lipid or cholesterol levels are high.  You are older than 56 years of age.  You are at high risk for heart disease. What should I know about cancer screening? Many types of cancers can be detected early and may often be prevented. Depending on your health history and family history, you may need to have cancer screening at various ages. This may include screening for:  Colorectal cancer.  Prostate cancer.  Skin cancer.  Lung cancer. What should I know about heart disease, diabetes, and high blood pressure? Blood pressure and heart disease  High blood pressure causes heart disease and increases the risk of stroke. This is more likely to develop in people who have high blood pressure readings, are of African descent, or are overweight.  Talk with your health care provider about your target blood pressure readings.  Have your blood pressure checked: ? Every 3-5 years if you are 24-25 years of age. ? Every year if you are 45 years old or older.  If you are between the ages of 29 and 64 and are a current or former smoker, ask your health care provider if you should have a one-time screening for abdominal aortic aneurysm (AAA). Diabetes Have regular diabetes screenings. This checks your fasting blood sugar level. Have the screening done:  Once every three years after age 63 if you are at a normal weight and have a low risk for diabetes.  More often  and at a younger age if you are overweight or have a high risk for diabetes. What should I know about preventing infection? Hepatitis B If you have a higher risk for hepatitis B, you should be screened for this virus. Talk with your health care provider to find out if you are at risk for hepatitis B infection. Hepatitis C Blood testing is recommended for:  Everyone born from [birth year] through 1967.  Anyone with known risk factors for hepatitis C. Sexually transmitted infections (STIs)  You should be  screened each year for STIs, including gonorrhea and chlamydia, if: ? You are sexually active and are younger than 56 years of age. ? You are older than 56 years of age and your health care provider tells you that you are at risk for this type of infection. ? Your sexual activity has changed since you were last screened, and you are at increased risk for chlamydia or gonorrhea. Ask your health care provider if you are at risk.  Ask your health care provider about whether you are at high risk for HIV. Your health care provider may recommend a prescription medicine to help prevent HIV infection. If you choose to take medicine to prevent HIV, you should first get tested for HIV. You should then be tested every 3 months for as long as you are taking the medicine. Follow these instructions at home: Lifestyle  Do not use any products that contain nicotine or tobacco, such as cigarettes, e-cigarettes, and chewing tobacco. If you need help quitting, ask your health care provider.  Do not use street drugs.  Do not share needles.  Ask your health care provider for help if you need support or information about quitting drugs. Alcohol use  Do not drink alcohol if your health care provider tells you not to drink.  If you drink alcohol: ? Limit how much you have to 0-2 drinks a day. ? Be aware of how much alcohol is in your drink. In the [place] one drink equals one 12 oz bottle of beer (355 mL), one 5 oz glass of wine (148 mL), or one 1 oz glass of hard liquor (44 mL). General instructions  Schedule regular health, dental, and eye exams.  Stay current with your vaccines.  Tell your health care provider if: ? You often feel depressed. ? You have ever been abused or do not feel safe at home. Summary  Adopting a healthy lifestyle and getting preventive care are important in promoting health and wellness.  Follow your health care provider's instructions about healthy diet, exercising, and getting  tested or screened for diseases.  Follow your health care provider's instructions on monitoring your cholesterol and blood pressure. This information is not intended to replace advice given to you by your health care provider. Make sure you discuss any questions you have with your health care provider. Document Revised: 07/15/2018 Document Reviewed: 07/15/2018 Elsevier Patient Education  2020 [employer].

## 2020-01-25 DIAGNOSIS — E1169 Type 2 diabetes mellitus with other specified complication: Secondary | ICD-10-CM | POA: Insufficient documentation

## 2020-01-25 DIAGNOSIS — E669 Obesity, unspecified: Secondary | ICD-10-CM | POA: Insufficient documentation

## 2020-01-25 NOTE — Assessment & Plan Note (Signed)
He has RESUMED  Lipitor  And LDL is at goal  LFTs are normal.  . Lab Results  Component Value Date   CHOL 142 01/17/2020   HDL 37.70 (L) 01/17/2020   LDLCALC 81 01/17/2020   LDLDIRECT 82 12/23/2014   TRIG 115.0 01/17/2020   CHOLHDL 4 01/17/2020   Lab Results  Component Value Date   ALT 24 01/17/2020   AST 18 01/17/2020   ALKPHOS 72 01/17/2020   BILITOT 0.5 01/17/2020

## 2020-01-25 NOTE — Assessment & Plan Note (Signed)
Improved control on current regimen.  Encouraged to exercise regularly to improve insulin sensitivity glycemic control and address obesity.  No changes today to regimen  Lab Results  Component Value Date   HGBA1C 7.0 (H) 01/17/2020

## 2020-01-25 NOTE — Assessment & Plan Note (Addendum)
Well controlled on current regimen of amlodipine, losartan and hct. Renal function stable, no changes today.  Lab Results  Component Value Date   CREATININE 0.85 01/17/2020   Lab Results  Component Value Date   NA 139 01/17/2020   K 4.6 01/17/2020   CL 102 01/17/2020   CO2 30 01/17/2020

## 2020-03-19 ENCOUNTER — Other Ambulatory Visit: Payer: Self-pay | Admitting: Internal Medicine

## 2020-04-05 ENCOUNTER — Other Ambulatory Visit: Payer: Self-pay | Admitting: Internal Medicine

## 2020-04-05 DIAGNOSIS — E119 Type 2 diabetes mellitus without complications: Secondary | ICD-10-CM

## 2020-05-17 ENCOUNTER — Other Ambulatory Visit: Payer: Self-pay | Admitting: Internal Medicine

## 2020-05-17 MED ORDER — LOSARTAN POTASSIUM-HCTZ 100-25 MG PO TABS: 1.0000 | ORAL_TABLET | Freq: Every day | ORAL | 1 refills | Status: DC

## 2020-05-17 NOTE — Telephone Encounter (Signed)
Medication has been refilled pt is just waning to know if he should stop taking the 81 mg Aspirin.

## 2020-06-22 MED ORDER — TACROLIMUS 0.1 % EX OINT: TOPICAL_OINTMENT | CUTANEOUS | 1 refills | Status: AC | PRN

## 2020-07-09 ENCOUNTER — Other Ambulatory Visit: Payer: Self-pay | Admitting: Internal Medicine

## 2020-07-09 DIAGNOSIS — E119 Type 2 diabetes mellitus without complications: Secondary | ICD-10-CM

## 2020-07-25 ENCOUNTER — Encounter: Payer: Self-pay | Admitting: Internal Medicine

## 2020-07-25 ENCOUNTER — Other Ambulatory Visit: Payer: Self-pay

## 2020-07-25 ENCOUNTER — Ambulatory Visit: Payer: BC Managed Care – PPO | Admitting: Internal Medicine

## 2020-07-25 VITALS — BP 106/60 | HR 105 | Temp 98.4°F | Resp 15 | Ht 70.5 in | Wt 210.2 lb

## 2020-07-25 DIAGNOSIS — I10 Essential (primary) hypertension: Secondary | ICD-10-CM | POA: Diagnosis not present

## 2020-07-25 DIAGNOSIS — E785 Hyperlipidemia, unspecified: Secondary | ICD-10-CM | POA: Diagnosis not present

## 2020-07-25 DIAGNOSIS — E1169 Type 2 diabetes mellitus with other specified complication: Secondary | ICD-10-CM | POA: Diagnosis not present

## 2020-07-25 DIAGNOSIS — J00 Acute nasopharyngitis [common cold]: Secondary | ICD-10-CM | POA: Diagnosis not present

## 2020-07-25 DIAGNOSIS — E669 Obesity, unspecified: Secondary | ICD-10-CM | POA: Diagnosis not present

## 2020-07-25 DIAGNOSIS — Z23 Encounter for immunization: Secondary | ICD-10-CM | POA: Diagnosis not present

## 2020-07-25 LAB — CBC WITH DIFFERENTIAL/PLATELET
Basophils Absolute: 0 10*3/uL (ref 0.0–0.1)
Basophils Relative: 0.5 % (ref 0.0–3.0)
Eosinophils Absolute: 0.1 10*3/uL (ref 0.0–0.7)
Eosinophils Relative: 1.6 % (ref 0.0–5.0)
HCT: 44.3 % (ref 39.0–52.0)
Hemoglobin: 15.2 g/dL (ref 13.0–17.0)
Lymphocytes Relative: 19.5 % (ref 12.0–46.0)
Lymphs Abs: 1.4 10*3/uL (ref 0.7–4.0)
MCHC: 34.3 g/dL (ref 30.0–36.0)
MCV: 86.5 fl (ref 78.0–100.0)
Monocytes Absolute: 0.8 10*3/uL (ref 0.1–1.0)
Monocytes Relative: 10.5 % (ref 3.0–12.0)
Neutro Abs: 5 10*3/uL (ref 1.4–7.7)
Neutrophils Relative %: 67.9 % (ref 43.0–77.0)
Platelets: 323 10*3/uL (ref 150.0–400.0)
RBC: 5.12 Mil/uL (ref 4.22–5.81)
RDW: 12.9 % (ref 11.5–15.5)
WBC: 7.4 10*3/uL (ref 4.0–10.5)

## 2020-07-25 LAB — COMPREHENSIVE METABOLIC PANEL
ALT: 25 U/L (ref 0–53)
AST: 19 U/L (ref 0–37)
Albumin: 4.3 g/dL (ref 3.5–5.2)
Alkaline Phosphatase: 71 U/L (ref 39–117)
BUN: 12 mg/dL (ref 6–23)
CO2: 31 mEq/L (ref 19–32)
Calcium: 10.4 mg/dL (ref 8.4–10.5)
Chloride: 102 mEq/L (ref 96–112)
Creatinine, Ser: 0.97 mg/dL (ref 0.40–1.50)
GFR: 87.32 mL/min (ref >60.00)
Glucose, Bld: 139 mg/dL — ABNORMAL HIGH (ref 70–99)
Potassium: 3.9 mEq/L (ref 3.5–5.1)
Sodium: 139 mEq/L (ref 135–145)
Total Bilirubin: 0.4 mg/dL (ref 0.2–1.2)
Total Protein: 7 g/dL (ref 6.0–8.3)

## 2020-07-25 LAB — LIPID PANEL
Cholesterol: 140 mg/dL (ref 0–200)
HDL: 36.6 mg/dL — ABNORMAL LOW (ref >39.00)
LDL Cholesterol: 65 mg/dL (ref 0–99)
NonHDL: 103.43
Total CHOL/HDL Ratio: 4
Triglycerides: 194 mg/dL — ABNORMAL HIGH (ref 0.0–149.0)
VLDL: 38.8 mg/dL (ref 0.0–40.0)

## 2020-07-25 LAB — HEMOGLOBIN A1C: Hgb A1c MFr Bld: 7.3 % — ABNORMAL HIGH (ref 4.6–6.5)

## 2020-07-25 NOTE — Progress Notes (Signed)
Subjective:  Patient ID: Joshua Arellano, male    DOB: March 27, 1966  Age: 54 y.o. MRN: [id number]  CC: The primary encounter diagnosis was Acute rhinitis. Diagnoses of Diabetes mellitus type 2 in obese (HCC), Primary hypertension, Dyslipidemia, goal LDL below 70, and Need for immunization against influenza were also pertinent to this visit.  HPI Joshua Arellano presents for follow up on type 2 DM, and hypertension   This visit occurred during the SARS-CoV-2 public health emergency.  Safety protocols were in place, including screening questions prior to the visit, additional usage of staff PPE, and extensive cleaning of exam room while observing appropriate contact time as indicated for disinfecting solutions.   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  Patient reports that he had sudden onset of sneezing and a runny nose yesterday evening,  But not today,  So he answered "No" to the screening tests today.  He is unvaccinated.   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  No replacements ,  Shortage of teachers.   T2DM:  Using the Freestyle Libre  90 day averages 128 fasting and 153 post prandial He feels generally well, is exercising several times per week   Denies any recent hypoglyemic events but his monitor has recorded several during sleeping hours .  Taking his medications as directed. Following a carbohydrate modified diet 6 days per week. Denies numbness, burning and tingling of extremities. Appetite is good.  Losing weight by home scales ,  201 at home.     Hypertension: patient  Does not checks blood pressure   Readings have been for the most part > 140/80 at rest . Patient is following a reduce salt diet most days and is taking medications as prescribed  Outpatient Medications Prior to Visit  Medication Sig Dispense Refill  . amLODipine (NORVASC) 10 MG tablet TAKE  1 TABLET(10 MG) BY MOUTH DAILY 90 tablet 3  . atorvastatin (LIPITOR) 20 MG tablet TAKE 1 TABLET(20 MG) BY MOUTH DAILY 90 tablet 0  . Continuous Blood Gluc Sensor (FREESTYLE LIBRE 14 DAY SENSOR) MISC by Other route every fourteen (14) days.    . Fluocinolone Acetonide 0.01 % OIL Place 1 drop in ear(s) 2 (two) times daily as needed. 20 mL 0  . JARDIANCE 25 MG TABS tablet Take 25 mg by mouth daily. 90 tablet 0  . losartan-hydrochlorothiazide (HYZAAR) 100-25 MG tablet Take 1 tablet by mouth daily. 90 tablet 1  . metFORMIN (GLUCOPHAGE-XR) 500 MG 24 hr tablet Take 4 tablets (2,000 mg total) by mouth daily. 360 tablet 0  . mometasone (ELOCON) 0.1 % cream Apply topically daily.    . Multiple Vitamins-Minerals (MULTIVITAMIN WITH MINERALS) tablet Take 1 tablet by mouth daily.    . Semaglutide,0.25 or 0.5MG /DOS, (OZEMPIC, 0.25 OR 0.5 MG/DOSE,) 2 MG/1.5ML SOPN Inject 0.5 mg into the skin once a week. 1 pen 2  . tacrolimus (PROTOPIC) 0.1 % ointment Apply topically as needed. 100 g 1   No facility-administered medications prior to visit.    Review of Systems;  Patient denies headache, fevers, malaise, unintentional weight loss, skin rash, eye pain, sinus congestion and sinus pain, sore throat, dysphagia,  hemoptysis , cough, dyspnea, wheezing, chest pain, palpitations, orthopnea, edema, abdominal pain, nausea, melena, diarrhea, constipation, flank pain, dysuria, hematuria, urinary  Frequency, nocturia, numbness, tingling, seizures,  Focal weakness, Loss of consciousness,  Tremor, insomnia, depression,  anxiety, and suicidal ideation.      Objective:  BP 106/60 (BP Location: Left Arm, Patient Position: Sitting, Cuff Size: Normal)   Pulse (!) 105   Temp 98.4 F (36.9 C) (Oral)   Resp 15   Ht 5' 10.5" (1.791 m)   Wt 210 lb 3.2 oz (95.3 kg)   SpO2 98%   BMI 29.73 kg/m   BP Readings from Last 3 Encounters:  07/25/20 106/60  01/24/20 130/84  06/25/19 132/81    Wt Readings from Last 3 Encounters:   07/25/20 210 lb 3.2 oz (95.3 kg)  01/24/20 203 lb (92.1 kg)  06/25/19 214 lb (97.1 kg)    General appearance: alert, cooperative and appears stated age Ears: normal TM's and external ear canals both ears Throat: lips, mucosa, and tongue normal; teeth and gums normal Neck: no adenopathy, no carotid bruit, supple, symmetrical, trachea midline and thyroid not enlarged, symmetric, no tenderness/mass/nodules Back: symmetric, no curvature. ROM normal. No CVA tenderness. Lungs: clear to auscultation bilaterally Heart: regular rate and rhythm, S1, S2 normal, no murmur, click, rub or gallop Abdomen: soft, non-tender; bowel sounds normal; no masses,  no organomegaly Pulses: 2+ and symmetric Skin: Skin color, texture, turgor normal. No rashes or lesions Lymph nodes: Cervical, supraclavicular, and axillary nodes normal.  Lab Results  Component Value Date   HGBA1C 7.3 (H) 07/25/2020   HGBA1C 7.0 (H) 01/17/2020   HGBA1C 7.2 08/17/2018    Lab Results  Component Value Date   CREATININE 0.97 07/25/2020   CREATININE 0.85 01/17/2020   CREATININE 0.95 08/24/2018    Lab Results  Component Value Date   WBC 7.4 07/25/2020   HGB 15.2 07/25/2020   HCT 44.3 07/25/2020   PLT 323.0 07/25/2020   GLUCOSE 139 (H) 07/25/2020   CHOL 140 07/25/2020   TRIG 194.0 (H) 07/25/2020   HDL 36.60 (L) 07/25/2020   LDLDIRECT 82 12/23/2014   LDLCALC 65 07/25/2020   ALT 25 07/25/2020   AST 19 07/25/2020   NA 139 07/25/2020   K 3.9 07/25/2020   CL 102 07/25/2020   CREATININE 0.97 07/25/2020   BUN 12 07/25/2020   CO2 31 07/25/2020   TSH 0.64 08/29/2015   PSA 0.46 01/17/2020   HGBA1C 7.3 (H) 07/25/2020   MICROALBUR <0.7 01/17/2020    MR Angiogram Head Wo Contrast  Result Date: 06/13/2019 CLINICAL DATA:  Other vascular headache. New daily persistent headache. Headache, chronic, normal neuro exam. Additional history provided: Headache for 4 weeks. EXAM: MRA HEAD WITHOUT CONTRAST TECHNIQUE: Angiographic  images of the Circle of Willis were obtained using MRA technique without intravenous contrast. COMPARISON:  Brain MRI 05/26/2019 FINDINGS: The intracranial internal carotid arteries are patent without significant stenosis. The bilateral middle and anterior cerebral arteries are patent without significant proximal stenosis. No intracranial aneurysm is identified. The intracranial vertebral arteries are patent without significant stenosis, as is the basilar artery. The bilateral posterior cerebral arteries are patent without significant proximal stenosis. The previously described tubular structure within the supra vermian space does not demonstrate arterial-like flow related enhancement and is presumably a venous vessel. IMPRESSION: 1. The previously described tubular structure within the supra vermian space does not demonstrate arterial-like flow related enhancement and is presumably a venous vessel. 2. No intracranial large vessel occlusion or proximal high-grade arterial stenosis. 3. No intracranial arterial aneurysm identified. Electronically Signed   By: Jackey Loge DO   On: 06/13/2019 17:48    Assessment & Plan:   Problem List Items Addressed This Visit  Unprioritized   Dyslipidemia, goal LDL below 70    He has RESUMED  Lipitor  And LDL is at goal  LFTs are normal.  . Lab Results  Component Value Date   CHOL 140 07/25/2020   HDL 36.60 (L) 07/25/2020   LDLCALC 65 07/25/2020   LDLDIRECT 82 12/23/2014   TRIG 194.0 (H) 07/25/2020   CHOLHDL 4 07/25/2020   Lab Results  Component Value Date   ALT 25 07/25/2020   AST 19 07/25/2020   ALKPHOS 71 07/25/2020   BILITOT 0.4 07/25/2020         Relevant Orders   Lipid panel (Completed)   Hypertension    Well controlled on current regimen of amlodipine, losartan and hct. Renal function stable, no changes today.  Lab Results  Component Value Date   CREATININE 0.97 07/25/2020   Lab Results  Component Value Date   NA 139 07/25/2020   K  3.9 07/25/2020   CL 102 07/25/2020   CO2 31 07/25/2020         Relevant Orders   Comprehensive metabolic panel (Completed)   Diabetes mellitus type 2 in obese (HCC)     Slight loss of control  on current regimen.  Encouraged to exercise regularly to improve insulin sensitivity glycemic control and address obesity.  No changes today to regimen  Lab Results  Component Value Date   HGBA1C 7.3 (H) 07/25/2020   Lab Results  Component Value Date   MICROALBUR <0.7 01/17/2020   MICROALBUR <0.7 08/24/2018           Relevant Orders   Hemoglobin A1c (Completed)    Other Visit Diagnoses    Acute rhinitis    -  Primary   Relevant Orders   CBC with Differential/Platelet (Completed)   Need for immunization against influenza       Relevant Orders   Flu Vaccine QUAD 36+ mos IM (Completed)     I provided  30 minutes of  face-to-face time during this encounter reviewing patient's current problems and past surgeries, labs and imaging studies, providing counseling on the above mentioned problems , and coordination  of care .  I am having Gala Murdoch maintain his multivitamin with minerals, FreeStyle Libre 14 Day Sensor, Jardiance, metFORMIN, Ozempic (0.25 or 0.5 MG/DOSE), Fluocinolone Acetonide, amLODipine, losartan-hydrochlorothiazide, tacrolimus, atorvastatin, and mometasone.  No orders of the defined types were placed in this encounter.   There are no discontinued medications.  Follow-up: Return in about 6 months (around 01/23/2021) for follow up diabetes.   Sherlene Shams, MD

## 2020-07-25 NOTE — Patient Instructions (Signed)
You have no evidence of atherosclerosis on prior imaging studies so you do not need to resume aspirin  Continue atorvastatin for primary prevention of heart disease   If your a1c is < 6.0,  We'll reduce your metformin   Joshua Arellano! I hope you have a wonderful holiday!

## 2020-07-28 NOTE — Assessment & Plan Note (Signed)
He has RESUMED  Lipitor  And LDL is at goal  LFTs are normal.  . Lab Results  Component Value Date   CHOL 140 07/25/2020   HDL 36.60 (L) 07/25/2020   LDLCALC 65 07/25/2020   LDLDIRECT 82 12/23/2014   TRIG 194.0 (H) 07/25/2020   CHOLHDL 4 07/25/2020   Lab Results  Component Value Date   ALT 25 07/25/2020   AST 19 07/25/2020   ALKPHOS 71 07/25/2020   BILITOT 0.4 07/25/2020

## 2020-07-28 NOTE — Assessment & Plan Note (Signed)
Well controlled on current regimen of amlodipine, losartan and hct. Renal function stable, no changes today.  Lab Results  Component Value Date   CREATININE 0.97 07/25/2020   Lab Results  Component Value Date   NA 139 07/25/2020   K 3.9 07/25/2020   CL 102 07/25/2020   CO2 31 07/25/2020

## 2020-07-28 NOTE — Assessment & Plan Note (Signed)
Slight loss of control  on current regimen.  Encouraged to exercise regularly to improve insulin sensitivity glycemic control and address obesity.  No changes today to regimen  Lab Results  Component Value Date   HGBA1C 7.3 (H) 07/25/2020   Lab Results  Component Value Date   MICROALBUR <0.7 01/17/2020   MICROALBUR <0.7 08/24/2018

## 2020-07-28 NOTE — Progress Notes (Signed)
diabetes is still under good control on current regimen, but  a1c has risen slightly compared to last time. other labs are normal. No medication changes were advised at this time, but  please limit the number of complex carbohydrates  in your diet to 2 daily and  get back to participating  in exercise regularly .  return in 3 months .4  Merry Christmas!  Regards,   Duncan Dull, MD

## 2020-11-15 ENCOUNTER — Other Ambulatory Visit: Payer: Self-pay | Admitting: Internal Medicine

## 2021-01-03 ENCOUNTER — Other Ambulatory Visit: Payer: Self-pay | Admitting: Internal Medicine

## 2021-01-23 ENCOUNTER — Encounter: Payer: Self-pay | Admitting: Internal Medicine

## 2021-01-23 ENCOUNTER — Other Ambulatory Visit: Payer: Self-pay

## 2021-01-23 ENCOUNTER — Ambulatory Visit: Payer: BC Managed Care – PPO | Admitting: Internal Medicine

## 2021-01-23 VITALS — BP 120/64 | HR 90 | Temp 97.1°F | Resp 15 | Ht 70.5 in | Wt 207.4 lb

## 2021-01-23 DIAGNOSIS — R5383 Other fatigue: Secondary | ICD-10-CM

## 2021-01-23 DIAGNOSIS — Z125 Encounter for screening for malignant neoplasm of prostate: Secondary | ICD-10-CM

## 2021-01-23 DIAGNOSIS — I10 Essential (primary) hypertension: Secondary | ICD-10-CM | POA: Diagnosis not present

## 2021-01-23 DIAGNOSIS — E785 Hyperlipidemia, unspecified: Secondary | ICD-10-CM

## 2021-01-23 DIAGNOSIS — Z0000 Encounter for general adult medical examination without abnormal findings: Secondary | ICD-10-CM | POA: Diagnosis not present

## 2021-01-23 DIAGNOSIS — G4733 Obstructive sleep apnea (adult) (pediatric): Secondary | ICD-10-CM

## 2021-01-23 DIAGNOSIS — E1169 Type 2 diabetes mellitus with other specified complication: Secondary | ICD-10-CM

## 2021-01-23 DIAGNOSIS — E669 Obesity, unspecified: Secondary | ICD-10-CM | POA: Diagnosis not present

## 2021-01-23 LAB — LIPID PANEL
Cholesterol: 137 mg/dL (ref 0–200)
HDL: 37.8 mg/dL — ABNORMAL LOW (ref >39.00)
LDL Cholesterol: 60 mg/dL (ref 0–99)
NonHDL: 99.5
Total CHOL/HDL Ratio: 4
Triglycerides: 196 mg/dL — ABNORMAL HIGH (ref 0.0–149.0)
VLDL: 39.2 mg/dL (ref 0.0–40.0)

## 2021-01-23 LAB — COMPREHENSIVE METABOLIC PANEL
ALT: 26 U/L (ref 0–53)
AST: 17 U/L (ref 0–37)
Albumin: 4.5 g/dL (ref 3.5–5.2)
Alkaline Phosphatase: 69 U/L (ref 39–117)
BUN: 11 mg/dL (ref 6–23)
CO2: 29 mEq/L (ref 19–32)
Calcium: 11.2 mg/dL — ABNORMAL HIGH (ref 8.4–10.5)
Chloride: 100 mEq/L (ref 96–112)
Creatinine, Ser: 0.87 mg/dL (ref 0.40–1.50)
GFR: 96.17 mL/min (ref >60.00)
Glucose, Bld: 117 mg/dL — ABNORMAL HIGH (ref 70–99)
Potassium: 4.3 mEq/L (ref 3.5–5.1)
Sodium: 139 mEq/L (ref 135–145)
Total Bilirubin: 0.5 mg/dL (ref 0.2–1.2)
Total Protein: 6.7 g/dL (ref 6.0–8.3)

## 2021-01-23 LAB — PSA: PSA: 0.52 ng/mL (ref 0.10–4.00)

## 2021-01-23 LAB — MICROALBUMIN / CREATININE URINE RATIO
Creatinine,U: 20.3 mg/dL
Microalb Creat Ratio: 3.5 mg/g (ref 0.0–30.0)
Microalb, Ur: <0.7 mg/dL (ref 0.0–1.9)

## 2021-01-23 LAB — B12 AND FOLATE PANEL
Folate: >24.4 ng/mL (ref >5.9)
Vitamin B-12: 393 pg/mL (ref 211–911)

## 2021-01-23 LAB — TSH: TSH: 1.18 u[IU]/mL (ref 0.35–4.50)

## 2021-01-23 LAB — HEMOGLOBIN A1C: Hgb A1c MFr Bld: 8.1 % — ABNORMAL HIGH (ref 4.6–6.5)

## 2021-01-23 MED ORDER — MOMETASONE FUROATE 0.1 % EX CREA: TOPICAL_CREAM | Freq: Every day | CUTANEOUS | 2 refills | Status: DC

## 2021-01-23 NOTE — Assessment & Plan Note (Signed)
Denies symptoms of hypersomnia and untreated OSA.  Checking b12 and thyroid function..  Advised to resume healthy lifestyle with exercise

## 2021-01-23 NOTE — Assessment & Plan Note (Signed)
Managed with ozempic 0.5 mg weekly, Metformin,  Jardiance 25 mg daily . Post prandials high due to diet.  Encouraged to exercise and get eye exam doen.  Foot exam normal today/

## 2021-01-23 NOTE — Progress Notes (Signed)
Patient ID: Joshua Arellano, male    DOB: [birth year]  Age: 55 y.o. MRN: [id number]  The patient is here for annual e wellness examination and management of other chronic and acute problems.   The risk factors are reflected in the social history.  The roster of all physicians providing medical care to patient - is listed in the Snapshot section of the chart.  Activities of daily living:  The patient is 100% independent in all ADLs: dressing, toileting, feeding as well as independent mobility  Home safety : The patient has smoke detectors in the home. They wear seatbelts.  There are no firearms at home. There is no violence in the home.   There is no risks for hepatitis, STDs or HIV. There is no   history of blood transfusion. They have no travel history to infectious disease endemic areas of the world.  The patient has seen their dentist in the last six month. They have seen their eye doctor in the last year. They admit to slight hearing difficulty with regard to whispered voices and some television programs.  They have deferred audiologic testing in the last year.  They do not  have excessive sun exposure. Discussed the need for sun protection: hats, long sleeves and use of sunscreen if there is significant sun exposure.   Diet: the importance of a healthy diet is discussed. He has a relatively health diet (when he eats his wife's cooking, ) but indulges in sausage biscuits daily .  The benefits of regular aerobic exercise were discussed. he walks 4 times per week ,  20 minutes.   Depression screen: there are no signs or vegative symptoms of depression- irritability, change in appetite, anhedonia, sadness/tearfullness.  Cognitive assessment: the patient manages all their financial and personal affairs and is actively engaged. They could relate day,date,year and events; recalled 2/3 objects at 3 minutes; performed clock-face test normally.  The following portions of the patient's history were  reviewed and updated as appropriate: allergies, current medications, past family history, past medical history,  past surgical history, past social history  and problem list.  Visual acuity was not assessed per patient preference since she has regular follow up with her ophthalmologist. Hearing and body mass index were assessed and reviewed.   During the course of the visit the patient was educated and counseled about appropriate screening and preventive services including : fall prevention , diabetes screening, nutrition counseling, colorectal cancer screening, and recommended immunizations.    CC: The primary encounter diagnosis was Fatigue, unspecified type. Diagnoses of Hyperlipidemia associated with type 2 diabetes mellitus (HCC), Diabetes mellitus type 2 in obese [facility] [place]), Prostate cancer screening, Encounter for preventive health examination, OSA (obstructive sleep apnea), Primary hypertension, Obesity (BMI 30-39.9), Serum calcium elevated, and Hypercalcemia were also pertinent to this visit.  1)  follow up on Type 2 DM:  mnaged by endocrinologist.  No longer wearing the cbg monitor  continually .   Post prandial breakfast readings high 183 due to  high carb breakfasts,  incl  sausage biscuit .  No longer taking Victoza.  Now taking 0.5 mg ozempic and weight is stable .  Lost weight initially due to emotional stress (lost job) . Going to start a daily walk with wife.   2) fatigue,  not during the day,  but falls asleep once he is  home., by 8 pm.  Not wearing CPAP.  has one nighttime void.  No trouble falling asleep , no trouble staying awake during  the day. .  Snoring has resolved. wakes up refreshed     History Mitzie Naverett has a past medical history of Diabetes mellitus, Hemorrhoid, Hemorrhoids, Hyperlipidemia, Hypertension, Knee pain, left, Obesity (BMI 30-39.9), and Sleep apnea.   He has a past surgical history that includes Tonsillectomy (1972); Anoscopy (10 years ago); and Colonoscopy with  propofol (N/A, 02/28/2016).   His family history includes CAD (age of onset: 1282) in his mother; Cancer in his maternal uncle; Heart attack in his maternal grandfather; Heart disease in his maternal grandfather; Hypertension in his maternal grandfather, paternal grandfather, and paternal grandmother; Transient ischemic attack in his father.He reports that he has never smoked. He has never used smokeless tobacco. He reports current alcohol use. He reports that he does not use drugs.  Outpatient Medications Prior to Visit  Medication Sig Dispense Refill   amLODipine (NORVASC) 10 MG tablet TAKE 1 TABLET(10 MG) BY MOUTH DAILY 30 tablet 0   atorvastatin (LIPITOR) 20 MG tablet TAKE 1 TABLET(20 MG) BY MOUTH DAILY 90 tablet 0   Continuous Blood Gluc Sensor (FREESTYLE LIBRE 14 DAY SENSOR) MISC by Other route every fourteen (14) days.     JARDIANCE 25 MG TABS tablet Take 25 mg by mouth daily. 90 tablet 0   losartan-hydrochlorothiazide (HYZAAR) 100-25 MG tablet TAKE 1 TABLET BY MOUTH DAILY 90 tablet 1   metFORMIN (GLUCOPHAGE-XR) 500 MG 24 hr tablet Take 4 tablets (2,000 mg total) by mouth daily. 360 tablet 0   Multiple Vitamins-Minerals (MULTIVITAMIN WITH MINERALS) tablet Take 1 tablet by mouth daily.     Semaglutide,0.25 or 0.5MG /DOS, (OZEMPIC, 0.25 OR 0.5 MG/DOSE,) 2 MG/1.5ML SOPN Inject 0.5 mg into the skin once a week. 1 pen 2   mometasone (ELOCON) 0.1 % cream Apply topically daily.     Fluocinolone Acetonide 0.01 % OIL Place 1 drop in ear(s) 2 (two) times daily as needed. (Patient not taking: Reported on 01/23/2021) 20 mL 0   tacrolimus (PROTOPIC) 0.1 % ointment Apply topically as needed. (Patient not taking: Reported on 01/23/2021) 100 g 1   No facility-administered medications prior to visit.    Review of Systems  Patient denies headache, fevers, malaise, unintentional weight loss, skin rash, eye pain, sinus congestion and sinus pain, sore throat, dysphagia,  hemoptysis , cough, dyspnea, wheezing,  chest pain, palpitations, orthopnea, edema, abdominal pain, nausea, melena, diarrhea, constipation, flank pain, dysuria, hematuria, urinary  Frequency, nocturia, numbness, tingling, seizures,  Focal weakness, Loss of consciousness,  Tremor, insomnia, depression, anxiety, and suicidal ideation.     Objective:  BP 120/64 (BP Location: Left Arm, Patient Position: Sitting, Cuff Size: Large)   Pulse 90   Temp (!) 97.1 F (36.2 C) (Temporal)   Resp 15   Ht 5' 10.5" (1.791 m)   Wt 207 lb 6.4 oz (94.1 kg)   SpO2 98%   BMI 29.34 kg/m   Physical Exam  General appearance: alert, cooperative and appears stated age Ears: bilateral scaling skin ,  right ear with healing ulcerated area.  normal TM's  Throat: lips, mucosa, and tongue normal; teeth and gums normal Neck: no adenopathy, no carotid bruit, supple, symmetrical, trachea midline and thyroid not enlarged, symmetric, no tenderness/mass/nodules Back: symmetric, no curvature. ROM normal. No CVA tenderness. Lungs: clear to auscultation bilaterally Heart: regular rate and rhythm, S1, S2 normal, no murmur, click, rub or gallop Abdomen: soft, non-tender; bowel sounds normal; no masses,  no organomegaly Pulses: 2+ and symmetric Skin: Skin color, texture, turgor normal. No rashes or lesions Lymph nodes:  Cervical, supraclavicular, and axillary nodes normal.   Assessment & Plan:   Problem List Items Addressed This Visit       Unprioritized   Diabetes mellitus type 2 in obese [facility])    Managed with ozempic 0.5 mg weekly, Metformin,  Jardiance 25 mg daily . Post prandials high due to diet.  Encouraged to exercise and get eye exam doen.  Foot exam normal today/       Relevant Orders   Hemoglobin A1c (Completed)   Comprehensive metabolic panel (Completed)   Lipid panel (Completed)   Microalbumin / creatinine urine ratio (Completed)   Encounter for preventive health examination    age appropriate education and counseling updated, referrals for  preventative services and immunizations addressed, dietary and smoking counseling addressed, most recent labs reviewed.  I have personally reviewed and have noted:   1) the patient's medical and social history 2) The pt's use of alcohol, tobacco, and illicit drugs 3) The patient's current medications and supplements 4) Functional ability including ADL's, fall risk, home safety risk, hearing and visual impairment 5) Diet and physical activities 6) Evidence for depression or mood disorder 7) The patient's height, weight, and BMI have been recorded in the chart   I have made referrals, and provided counseling and education based on review of the above       Fatigue - Primary    Denies symptoms of hypersomnia and untreated OSA.  Checking b12 and thyroid function..  Advised to resume healthy lifestyle with exercise        Relevant Orders   B12 and Folate Panel (Completed)   TSH (Completed)   Hypercalcemia    Etiology unclear.  Taking hctz.  Will repeat level with PTH       Hyperlipidemia associated with type 2 diabetes mellitus (HCC)    He has RESUMED  Lipitor  And LDL is at goal  LFTs are normal.  . Lab Results  Component Value Date   CHOL 137 01/23/2021   HDL 37.80 (L) 01/23/2021   LDLCALC 60 01/23/2021   LDLDIRECT 82 12/23/2014   TRIG 196.0 (H) 01/23/2021   CHOLHDL 4 01/23/2021   Lab Results  Component Value Date   ALT 26 01/23/2021   AST 17 01/23/2021   ALKPHOS 69 01/23/2021   BILITOT 0.5 01/23/2021         Hypertension    Well controlled on current regimen of losartan, amlodipine and hct . Renal function stable, no changes today.  Lab Results  Component Value Date   CREATININE 0.87 01/23/2021   Lab Results  Component Value Date   NA 139 01/23/2021   K 4.3 01/23/2021   CL 100 01/23/2021   CO2 29 01/23/2021          Obesity (BMI 30-39.9)    He has not lost weight despite starting Ozempic. I have addressed  BMI and recommended a low glycemic index  diet utilizing smaller more frequent meals to increase metabolism.  I have also recommended that patient start exercising with a goal of 30 minutes of aerobic exercise a minimum of 5 days per week.         OSA (obstructive sleep apnea)    Untreated for years.  Denies symptoms currently        Prostate cancer screening   Relevant Orders   PSA (Completed)   Other Visit Diagnoses     Serum calcium elevated       Relevant Orders   PTH, Intact and Calcium  I provided  30 minutes of  face-to-face time during this encounter reviewing patient's current problems and past surgeries, labs and imaging studies, providing counseling on the above mentioned problems , and coordination  of care .   I have changed Kallon C. Tramontana's mometasone. I am also having him maintain his multivitamin with minerals, FreeStyle Libre 14 Day Sensor, Jardiance, metFORMIN, Ozempic (0.25 or 0.5 MG/DOSE), Fluocinolone Acetonide, tacrolimus, atorvastatin, losartan-hydrochlorothiazide, and amLODipine.  Meds ordered this encounter  Medications   mometasone (ELOCON) 0.1 % cream    Sig: Apply topically daily. KEEP ON FILE FOR FUTURE REFILLS    Dispense:  45 g    Refill:  2    Medications Discontinued During This Encounter  Medication Reason   mometasone (ELOCON) 0.1 % cream Reorder    Follow-up: No follow-ups on file.   Sherlene Shams, MD

## 2021-01-23 NOTE — Assessment & Plan Note (Signed)
Untreated for years.  Denies symptoms currently

## 2021-01-23 NOTE — Assessment & Plan Note (Signed)

## 2021-01-23 NOTE — Progress Notes (Deleted)
Subjective:  Patient ID: Joshua Arellano, male    DOB: 01/22/1966  Age: 55 y.o. MRN: [id number]  CC: There were no encounter diagnoses.  HPI Joshua Arellano presents for  diabetes  follow up  This visit occurred during the SARS-CoV-2 public health emergency.  Safety protocols were in place, including screening questions prior to the visit, additional usage of staff PPE, and extensive cleaning of exam room while observing appropriate contact time as indicated for disinfecting solutions.   Type 2 DM:  mnaged by endocrinologist.  No longer wearing the cbg monitor  continually .   Post prandial breakfast readings high 183 due to  high carb breakfasts,  incl  sausage biscuit .  No longer taking Victoza.  Now taking 0.5 mg ozempic and weight is stable .  Lost weight initially due to emotional stress (lost job) . Going to start a daily walk with wife.   Cc:  fatigue,  falls asleep once home.  Not wearing CPAP.  One nighttime void.  No trouble falling asleep or staying awake.  Snoring has resolved  wakes up refreshed  Outpatient Medications Prior to Visit  Medication Sig Dispense Refill   amLODipine (NORVASC) 10 MG tablet TAKE 1 TABLET(10 MG) BY MOUTH DAILY 30 tablet 0   atorvastatin (LIPITOR) 20 MG tablet TAKE 1 TABLET(20 MG) BY MOUTH DAILY 90 tablet 0   Continuous Blood Gluc Sensor (FREESTYLE LIBRE 14 DAY SENSOR) MISC by Other route every fourteen (14) days.     JARDIANCE 25 MG TABS tablet Take 25 mg by mouth daily. 90 tablet 0   losartan-hydrochlorothiazide (HYZAAR) 100-25 MG tablet TAKE 1 TABLET BY MOUTH DAILY 90 tablet 1   metFORMIN (GLUCOPHAGE-XR) 500 MG 24 hr tablet Take 4 tablets (2,000 mg total) by mouth daily. 360 tablet 0   mometasone (ELOCON) 0.1 % cream Apply topically daily.     Multiple Vitamins-Minerals (MULTIVITAMIN WITH MINERALS) tablet Take 1 tablet by mouth daily.     Semaglutide,0.25 or 0.5MG /DOS, (OZEMPIC, 0.25 OR 0.5 MG/DOSE,) 2 MG/1.5ML SOPN Inject 0.5 mg into the skin  once a week. 1 pen 2   Fluocinolone Acetonide 0.01 % OIL Place 1 drop in ear(s) 2 (two) times daily as needed. (Patient not taking: Reported on 01/23/2021) 20 mL 0   tacrolimus (PROTOPIC) 0.1 % ointment Apply topically as needed. (Patient not taking: Reported on 01/23/2021) 100 g 1   No facility-administered medications prior to visit.    Review of Systems;  Patient denies headache, fevers, malaise, unintentional weight loss, skin rash, eye pain, sinus congestion and sinus pain, sore throat, dysphagia,  hemoptysis , cough, dyspnea, wheezing, chest pain, palpitations, orthopnea, edema, abdominal pain, nausea, melena, diarrhea, constipation, flank pain, dysuria, hematuria, urinary  Frequency, nocturia, numbness, tingling, seizures,  Focal weakness, Loss of consciousness,  Tremor, insomnia, depression, anxiety, and suicidal ideation.      Objective:  BP 120/64 (BP Location: Left Arm, Patient Position: Sitting, Cuff Size: Large)   Pulse 90   Temp (!) 97.1 F (36.2 C) (Temporal)   Resp 15   Ht 5' 10.5" (1.791 m)   Wt 207 lb 6.4 oz (94.1 kg)   SpO2 98%   BMI 29.34 kg/m   BP Readings from Last 3 Encounters:  01/23/21 120/64  07/25/20 106/60  01/24/20 130/84    Wt Readings from Last 3 Encounters:  01/23/21 207 lb 6.4 oz (94.1 kg)  07/25/20 210 lb 3.2 oz (95.3 kg)  01/24/20 203 lb (92.1 kg)  General appearance: alert, cooperative and appears stated age Ears: normal TM's and external ear canals both ears Throat: lips, mucosa, and tongue normal; teeth and gums normal Neck: no adenopathy, no carotid bruit, supple, symmetrical, trachea midline and thyroid not enlarged, symmetric, no tenderness/mass/nodules Back: symmetric, no curvature. ROM normal. No CVA tenderness. Lungs: clear to auscultation bilaterally Heart: regular rate and rhythm, S1, S2 normal, no murmur, click, rub or gallop Abdomen: soft, non-tender; bowel sounds normal; no masses,  no organomegaly Pulses: 2+ and  symmetric Skin: Skin color, texture, turgor normal. No rashes or lesions Lymph nodes: Cervical, supraclavicular, and axillary nodes normal.  Lab Results  Component Value Date   HGBA1C 7.3 (H) 07/25/2020   HGBA1C 7.0 (H) 01/17/2020   HGBA1C 7.2 08/17/2018    Lab Results  Component Value Date   CREATININE 0.97 07/25/2020   CREATININE 0.85 01/17/2020   CREATININE 0.95 08/24/2018    Lab Results  Component Value Date   WBC 7.4 07/25/2020   HGB 15.2 07/25/2020   HCT 44.3 07/25/2020   PLT 323.0 07/25/2020   GLUCOSE 139 (H) 07/25/2020   CHOL 140 07/25/2020   TRIG 194.0 (H) 07/25/2020   HDL 36.60 (L) 07/25/2020   LDLDIRECT 82 12/23/2014   LDLCALC 65 07/25/2020   ALT 25 07/25/2020   AST 19 07/25/2020   NA 139 07/25/2020   K 3.9 07/25/2020   CL 102 07/25/2020   CREATININE 0.97 07/25/2020   BUN 12 07/25/2020   CO2 31 07/25/2020   TSH 0.64 08/29/2015   PSA 0.46 01/17/2020   HGBA1C 7.3 (H) 07/25/2020   MICROALBUR <0.7 01/17/2020    MR Angiogram Head Wo Contrast  Result Date: 06/13/2019 CLINICAL DATA:  Other vascular headache. New daily persistent headache. Headache, chronic, normal neuro exam. Additional history provided: Headache for 4 weeks. EXAM: MRA HEAD WITHOUT CONTRAST TECHNIQUE: Angiographic images of the Circle of Willis were obtained using MRA technique without intravenous contrast. COMPARISON:  Brain MRI 05/26/2019 FINDINGS: The intracranial internal carotid arteries are patent without significant stenosis. The bilateral middle and anterior cerebral arteries are patent without significant proximal stenosis. No intracranial aneurysm is identified. The intracranial vertebral arteries are patent without significant stenosis, as is the basilar artery. The bilateral posterior cerebral arteries are patent without significant proximal stenosis. The previously described tubular structure within the supra vermian space does not demonstrate arterial-like flow related enhancement and  is presumably a venous vessel. IMPRESSION: 1. The previously described tubular structure within the supra vermian space does not demonstrate arterial-like flow related enhancement and is presumably a venous vessel. 2. No intracranial large vessel occlusion or proximal high-grade arterial stenosis. 3. No intracranial arterial aneurysm identified. Electronically Signed   By: Jackey Loge DO   On: 06/13/2019 17:48    Assessment & Plan:   Problem List Items Addressed This Visit   None   I am having Gala Murdoch maintain his multivitamin with minerals, FreeStyle Libre 14 Day Sensor, Jardiance, metFORMIN, Ozempic (0.25 or 0.5 MG/DOSE), Fluocinolone Acetonide, tacrolimus, atorvastatin, mometasone, losartan-hydrochlorothiazide, and amLODipine.  No orders of the defined types were placed in this encounter.   There are no discontinued medications.  Follow-up: No follow-ups on file.   Sherlene Shams, MD

## 2021-01-23 NOTE — Patient Instructions (Signed)
Good to see you!  Try to do some form of exercise after your carb breakfast;  it will help bring the post prandials down   We will call today your annual CPE

## 2021-01-24 DIAGNOSIS — E210 Primary hyperparathyroidism: Secondary | ICD-10-CM | POA: Insufficient documentation

## 2021-01-24 NOTE — Addendum Note (Signed)
Addended by: Sherlene Shams on: 01/24/2021 10:51 PM   Modules accepted: Level of Service

## 2021-01-24 NOTE — Assessment & Plan Note (Addendum)
He has not lost weight despite starting Ozempic. I have addressed  BMI and recommended a low glycemic index diet utilizing smaller more frequent meals to increase metabolism.  I have also recommended that patient start exercising with a goal of 30 minutes of aerobic exercise a minimum of 5 days per week.

## 2021-01-24 NOTE — Assessment & Plan Note (Signed)
He has RESUMED  Lipitor  And LDL is at goal  LFTs are normal.  . Lab Results  Component Value Date   CHOL 137 01/23/2021   HDL 37.80 (L) 01/23/2021   LDLCALC 60 01/23/2021   LDLDIRECT 82 12/23/2014   TRIG 196.0 (H) 01/23/2021   CHOLHDL 4 01/23/2021   Lab Results  Component Value Date   ALT 26 01/23/2021   AST 17 01/23/2021   ALKPHOS 69 01/23/2021   BILITOT 0.5 01/23/2021

## 2021-01-24 NOTE — Assessment & Plan Note (Addendum)
Well controlled on current regimen of losartan, amlodipine and hct . Renal function stable, no changes today.  Lab Results  Component Value Date   CREATININE 0.87 01/23/2021   Lab Results  Component Value Date   NA 139 01/23/2021   K 4.3 01/23/2021   CL 100 01/23/2021   CO2 29 01/23/2021

## 2021-01-24 NOTE — Assessment & Plan Note (Signed)
Etiology unclear.  Taking hctz.  Will repeat level with PTH

## 2021-01-26 ENCOUNTER — Other Ambulatory Visit: Payer: Self-pay

## 2021-01-26 ENCOUNTER — Other Ambulatory Visit (INDEPENDENT_AMBULATORY_CARE_PROVIDER_SITE_OTHER): Payer: BC Managed Care – PPO

## 2021-01-30 LAB — PTH, INTACT AND CALCIUM
Calcium: 10 mg/dL (ref 8.6–10.3)
PTH: 82 pg/mL — ABNORMAL HIGH (ref 16–77)

## 2021-04-23 ENCOUNTER — Telehealth: Payer: Self-pay | Admitting: Internal Medicine

## 2021-04-23 NOTE — Telephone Encounter (Signed)
Patient's wife called about the status of the note below.

## 2021-04-23 NOTE — Telephone Encounter (Signed)
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 Scheduled the pt a virtual visit with Dr. Darrick Huntsman tomorrow. Pt is ware of appt date and time.

## 2021-04-23 NOTE — Telephone Encounter (Signed)
Hello!  I started having symptoms (scratchy throat, slight cough) Friday night.  I just tested positive for COVID on a home test kit just now (waited 48 hours because when my wife had this a month ago she tested negative the first three times until about 48 hours after symptoms).  What do I need to do about getting an official PCR test for work and possibly starting some sort of antiviral?   If you call, please leave a message as I screen calls due to all the spam calls and I will call right back.  I will try to call your office in the morning when you open.   My symptoms currently are just tiredness, a cough and slight congestion.   Joshua Arellano [phone]    NO APPOINTMENTS AVAILABLE.

## 2021-04-23 NOTE — Telephone Encounter (Signed)
Patient tested positive for covid yesterday. He is requesting the antiviral medication.

## 2021-04-23 NOTE — Telephone Encounter (Signed)
Sent to Dr. Tullo as a telephone encounter 

## 2021-04-24 ENCOUNTER — Encounter: Payer: Self-pay | Admitting: Internal Medicine

## 2021-04-24 ENCOUNTER — Telehealth (INDEPENDENT_AMBULATORY_CARE_PROVIDER_SITE_OTHER): Payer: BC Managed Care – PPO | Admitting: Internal Medicine

## 2021-04-24 DIAGNOSIS — U071 COVID-19: Secondary | ICD-10-CM

## 2021-04-24 HISTORY — DX: COVID-19: U07.1

## 2021-04-24 MED ORDER — BENZONATATE 200 MG PO CAPS: 200.0000 mg | ORAL_CAPSULE | Freq: Three times a day (TID) | ORAL | 1 refills | Status: DC | PRN

## 2021-04-24 NOTE — Assessment & Plan Note (Signed)
Symptoms are mild and have been present for 5 days.  Supportive care outlined. Return to work next Thursday if all symptoms are improving. Tessalon perles for cough.

## 2021-04-24 NOTE — Progress Notes (Signed)
Virtual Visit via CA[employer] Note  This visit type was conducted due to national recommendations for restrictions regarding the COVID-19 pandemic (e.g. social distancing).  This format is felt to be most appropriate for this patient at this time.  All issues noted in this document were discussed and addressed.  No physical exam was performed (except for noted visual exam findings with Video Visits).   I connected withNAME@ on 04/24/21 at 12:00 PM EDT by a video enabled telemedicine application  and verified that I am speaking with the correct person using two identifiers. Location patient: home Location provider: work or home office Persons participating in the virtual visit: patient, provider  I discussed the limitations, risks, security and privacy concerns of performing an evaluation and management service by telephone and the availability of in person appointments. I also discussed with the patient that there may be a patient responsible charge related to this service. The patient expressed understanding and agreed to proceed.   Reason for visit: COVID INFECTION   HPI:  55 YR OLD MALE WITH TYPE 2 DM, NOT VACCINATED AGAINST COVID, t ested positive for covid by home test on sept 18. Symptoms began on Sept 16  with a sore throat and cough progressed to congestion, sneezing, watery eyes, fatigue. No fever that he is aware of. Has been taking Sudafed once daily since Sunday. Pt stated that he does not like to take cold medicines  due to side effects  of agitation.  He is not wheezing,  no shortness of breath, pleurisy or facila pain .  Some cough worse at nigh,  nonproductive    ROS: See pertinent positives and negatives per HPI.  Past Medical History:  Diagnosis Date   Diabetes mellitus    Hemorrhoid    Hemorrhoids    Hyperlipidemia    Hypertension    Knee pain, left    episodic   Obesity (BMI 30-39.9)    Sleep apnea    Uses C-PAP machine    Past Surgical History:  Procedure  Laterality Date   ANOSCOPY  10 years ago   Hemorrhoids   COLONOSCOPY WITH PROPOFOL N/A 02/28/2016   Procedure: COLONOSCOPY WITH PROPOFOL;  Surgeon: Earline Mayotte, MD;  Location: [facility] ENDOSCOPY;  Service: Endoscopy;  Laterality: N/A;   TONSILLECTOMY  1972    Family History  Problem Relation Age of Onset   Transient ischemic attack Father    CAD Mother 27   Cancer Maternal Uncle        colon ca    Heart disease Maternal Grandfather    Hypertension Maternal Grandfather    Heart attack Maternal Grandfather    Hypertension Paternal Grandmother    Hypertension Paternal Grandfather     SOCIAL HX.   reports that he has never smoked. He has never used smokeless tobacco. He reports current alcohol use. He reports that he does not use drugs.    Current Outpatient Medications:    amLODipine (NORVASC) 10 MG tablet, TAKE 1 TABLET(10 MG) BY MOUTH DAILY, Disp: 30 tablet, Rfl: 0   atorvastatin (LIPITOR) 20 MG tablet, TAKE 1 TABLET(20 MG) BY MOUTH DAILY, Disp: 90 tablet, Rfl: 0   benzonatate (TESSALON) 200 MG capsule, Take 1 capsule (200 mg total) by mouth 3 (three) times daily as needed for cough., Disp: 60 capsule, Rfl: 1   Continuous Blood Gluc Sensor (FREESTYLE LIBRE 14 DAY SENSOR) MISC, by Other route every fourteen (14) days., Disp: , Rfl:    JARDIANCE 25 MG TABS tablet, Take  25 mg by mouth daily., Disp: 90 tablet, Rfl: 0   losartan-hydrochlorothiazide (HYZAAR) 100-25 MG tablet, TAKE 1 TABLET BY MOUTH DAILY, Disp: 90 tablet, Rfl: 1   metFORMIN (GLUCOPHAGE-XR) 500 MG 24 hr tablet, Take 4 tablets (2,000 mg total) by mouth daily., Disp: 360 tablet, Rfl: 0   mometasone (ELOCON) 0.1 % cream, Apply topically daily. KEEP ON FILE FOR FUTURE REFILLS, Disp: 45 g, Rfl: 2   Multiple Vitamins-Minerals (MULTIVITAMIN WITH MINERALS) tablet, Take 1 tablet by mouth daily., Disp: , Rfl:    OZEMPIC, 1 MG/DOSE, 4 MG/3ML SOPN, Inject 1 mg into the skin once a week., Disp: , Rfl:    Fluocinolone Acetonide 0.01 %  OIL, Place 1 drop in ear(s) 2 (two) times daily as needed. (Patient not taking: No sig reported), Disp: 20 mL, Rfl: 0   tacrolimus (PROTOPIC) 0.1 % ointment, Apply topically as needed. (Patient not taking: No sig reported), Disp: 100 g, Rfl: 1  EXAM:  VITALS per patient if applicable:  GENERAL: alert, oriented, appears well and in no acute distress  HEENT: atraumatic, conjunttiva clear, no obvious abnormalities on inspection of external nose and ears  NECK: normal movements of the head and neck  LUNGS: on inspection no signs of respiratory distress, breathing rate appears normal, no obvious gross SOB, gasping or wheezing  CV: no obvious cyanosis  MS: moves all visible extremities without noticeable abnormality  PSYCH/NEURO: pleasant and cooperative, no obvious depression or anxiety, speech and thought processing grossly intact  ASSESSMENT AND PLAN:  Discussed the following assessment and plan:  COVID-19 virus infection  No problem-specific Assessment & Plan notes found for this encounter.    I discussed the assessment and treatment plan with the patient. The patient was provided an opportunity to ask questions and all were answered. The patient agreed with the plan and demonstrated an understanding of the instructions.   The patient was advised to call back or seek an in-person evaluation if the symptoms worsen or if the condition fails to improve as anticipated.   I spent 30 minutes dedicated to the care of this patient on the date of this encounter to include pre-visit review of his medical history,  Face-to-face time with the patient , and post visit ordering of testing and therapeutics.    Sherlene Shams, MD

## 2021-04-24 NOTE — Patient Instructions (Signed)
HERE'S HOW YOU MANAGE COVID INFECTION :  VITAMIN C 1000 MG DAILY VITAMIN D 4000 IU DAILY  ZINC 50 MG DAILY   (stop these megadoses once you are feeling better. These are recommended by the CDC)  Use sudafed,  Sudafed PE or Afrin nasal spray for the congestion to prevent sinusitis or otitis .   Benzonotate capsules for cough ,  rx sent to pharmacy.  Can be used up to 3 times daily.  You may add benadry for nighttime dosing if needed.  YOU SHOULD BE FEVER FREE FOR 24 HOURS WITHOUT THE USE OF ADVIL OR MOTRIN ,  AND ALL SYMPTOMS NEED TO BE IMPROVING BEFORE YOU CAN BREAK QUARANTINE (MINIMUM OF 7 DAYS  FROM DAY 1 OF SYMPTOMS )   DO NOT RETEST YOURSELF FOR COVID.  THE TEST MAY REMAIN POSITIVE FOR AT LEAST 30 DAYS!

## 2021-04-30 ENCOUNTER — Other Ambulatory Visit: Payer: Self-pay | Admitting: Internal Medicine

## 2021-05-14 ENCOUNTER — Other Ambulatory Visit: Payer: Self-pay | Admitting: Internal Medicine

## 2021-05-30 ENCOUNTER — Other Ambulatory Visit: Payer: Self-pay | Admitting: Internal Medicine

## 2021-06-29 ENCOUNTER — Other Ambulatory Visit: Payer: Self-pay | Admitting: Internal Medicine

## 2021-07-17 ENCOUNTER — Encounter: Payer: Self-pay | Admitting: Internal Medicine

## 2021-08-02 ENCOUNTER — Other Ambulatory Visit: Payer: Self-pay | Admitting: Internal Medicine

## 2021-11-23 ENCOUNTER — Other Ambulatory Visit: Payer: Self-pay

## 2021-11-23 MED ORDER — LOSARTAN POTASSIUM-HCTZ 100-25 MG PO TABS: 1.0000 | ORAL_TABLET | Freq: Every day | ORAL | 0 refills | Status: DC

## 2021-12-24 LAB — PROTEIN / CREATININE RATIO, URINE
Albumin, U: 0.3
Creatinine, Urine: 31

## 2021-12-24 LAB — LIPID PANEL
Cholesterol: 128 (ref 0–200)
HDL: 38 (ref 35–70)
LDL Cholesterol: 76
Triglycerides: 70 (ref 40–160)

## 2021-12-24 LAB — MICROALBUMIN / CREATININE URINE RATIO: Microalb Creat Ratio: 3.5

## 2021-12-24 LAB — BASIC METABOLIC PANEL
BUN: 8 (ref 4–21)
CO2: 28 — AB (ref 13–22)
Creatinine: 0.8 (ref 0.6–1.3)
Potassium: 3.8 mEq/L (ref 3.5–5.1)
Sodium: 139 (ref 137–147)

## 2021-12-24 LAB — HEMOGLOBIN A1C: Hemoglobin A1C: 7.2

## 2021-12-25 LAB — PROTEIN / CREATININE RATIO, URINE: Albumin, U: 0.3

## 2021-12-26 LAB — PROTEIN / CREATININE RATIO, URINE: Albumin, U: 30.3

## 2022-01-16 ENCOUNTER — Encounter: Payer: Self-pay | Admitting: Internal Medicine

## 2022-01-16 DIAGNOSIS — I10 Essential (primary) hypertension: Secondary | ICD-10-CM

## 2022-01-16 DIAGNOSIS — E1169 Type 2 diabetes mellitus with other specified complication: Secondary | ICD-10-CM

## 2022-01-16 DIAGNOSIS — Z125 Encounter for screening for malignant neoplasm of prostate: Secondary | ICD-10-CM

## 2022-01-16 DIAGNOSIS — E669 Obesity, unspecified: Secondary | ICD-10-CM

## 2022-01-16 DIAGNOSIS — R5383 Other fatigue: Secondary | ICD-10-CM

## 2022-01-23 ENCOUNTER — Encounter: Payer: Self-pay | Admitting: Internal Medicine

## 2022-01-24 ENCOUNTER — Ambulatory Visit (INDEPENDENT_AMBULATORY_CARE_PROVIDER_SITE_OTHER): Payer: BC Managed Care – PPO | Admitting: Internal Medicine

## 2022-01-24 ENCOUNTER — Encounter: Payer: Self-pay | Admitting: Internal Medicine

## 2022-01-24 VITALS — BP 130/84 | HR 84 | Temp 97.4°F | Ht 70.5 in | Wt 209.8 lb

## 2022-01-24 DIAGNOSIS — E559 Vitamin D deficiency, unspecified: Secondary | ICD-10-CM

## 2022-01-24 DIAGNOSIS — E210 Primary hyperparathyroidism: Secondary | ICD-10-CM

## 2022-01-24 DIAGNOSIS — Z125 Encounter for screening for malignant neoplasm of prostate: Secondary | ICD-10-CM

## 2022-01-24 DIAGNOSIS — Z0000 Encounter for general adult medical examination without abnormal findings: Secondary | ICD-10-CM | POA: Diagnosis not present

## 2022-01-24 DIAGNOSIS — R5383 Other fatigue: Secondary | ICD-10-CM | POA: Diagnosis not present

## 2022-01-24 DIAGNOSIS — E1169 Type 2 diabetes mellitus with other specified complication: Secondary | ICD-10-CM | POA: Diagnosis not present

## 2022-01-24 DIAGNOSIS — I10 Essential (primary) hypertension: Secondary | ICD-10-CM | POA: Diagnosis not present

## 2022-01-24 DIAGNOSIS — Z23 Encounter for immunization: Secondary | ICD-10-CM

## 2022-01-24 DIAGNOSIS — E669 Obesity, unspecified: Secondary | ICD-10-CM

## 2022-01-24 DIAGNOSIS — E785 Hyperlipidemia, unspecified: Secondary | ICD-10-CM

## 2022-01-24 LAB — CBC WITH DIFFERENTIAL/PLATELET
Basophils Absolute: 0 10*3/uL (ref 0.0–0.1)
Basophils Relative: 0.7 % (ref 0.0–3.0)
Eosinophils Absolute: 0.1 10*3/uL (ref 0.0–0.7)
Eosinophils Relative: 1.1 % (ref 0.0–5.0)
HCT: 42.7 % (ref 39.0–52.0)
Hemoglobin: 14.4 g/dL (ref 13.0–17.0)
Lymphocytes Relative: 22.3 % (ref 12.0–46.0)
Lymphs Abs: 1.5 10*3/uL (ref 0.7–4.0)
MCHC: 33.8 g/dL (ref 30.0–36.0)
MCV: 86.7 fl (ref 78.0–100.0)
Monocytes Absolute: 0.6 10*3/uL (ref 0.1–1.0)
Monocytes Relative: 9.2 % (ref 3.0–12.0)
Neutro Abs: 4.5 10*3/uL (ref 1.4–7.7)
Neutrophils Relative %: 66.7 % (ref 43.0–77.0)
Platelets: 312 10*3/uL (ref 150.0–400.0)
RBC: 4.92 Mil/uL (ref 4.22–5.81)
RDW: 13.3 % (ref 11.5–15.5)
WBC: 6.8 10*3/uL (ref 4.0–10.5)

## 2022-01-24 LAB — B12 AND FOLATE PANEL
Folate: 16.2 ng/mL (ref >5.9)
Vitamin B-12: 687 pg/mL (ref 211–911)

## 2022-01-24 LAB — TSH: TSH: 1.25 u[IU]/mL (ref 0.35–5.50)

## 2022-01-24 LAB — PSA: PSA: 0.46 ng/mL (ref 0.10–4.00)

## 2022-01-24 LAB — VITAMIN D 25 HYDROXY (VIT D DEFICIENCY, FRACTURES): VITD: 48.41 ng/mL (ref 30.00–100.00)

## 2022-01-24 NOTE — Patient Instructions (Addendum)
Try 5 to 10 mg melatonin either after dinner or 30 minutes before bedtime  for the early waking   Dill pickle juice  one hot daily for  leg and foot cramps

## 2022-01-24 NOTE — Progress Notes (Unsigned)
The patient is here for annual preventive examination and management of other chronic and acute problems.   The risk factors are reflected in the social history.   The roster of all physicians providing medical care to patient - is listed in the Snapshot section of the chart.   Activities of daily living:  The patient is 100% independent in all ADLs: dressing, toileting, feeding as well as independent mobility   Home safety : The patient has smoke detectors in the home. They wear seatbelts.  There are no unsecured firearms at home. There is no violence in the home.    There is no risks for hepatitis, STDs or HIV. There is no   history of blood transfusion. They have no travel history to infectious disease endemic areas of the world.   The patient has seen their dentist in the last six month. They have seen their eye doctor in the last year. The patinet  denies slight hearing difficulty with regard to whispered voices and some television programs.  They have deferred audiologic testing in the last year.  They do not  have excessive sun exposure. Discussed the need for sun protection: hats, long sleeves and use of sunscreen if there is significant sun exposure.    Diet: the importance of a healthy diet is discussed. They do have a healthy diet.   The benefits of regular aerobic exercise were discussed. The patient  exercises  3 to 5 days per week  for  60 minutes.    Depression screen: there are no signs or vegative symptoms of depression- irritability, change in appetite, anhedonia, sadness/tearfullness.   The following portions of the patient's history were reviewed and updated as appropriate: allergies, current medications, past family history, past medical history,  past surgical history, past social history  and problem list.   Visual acuity was not assessed per patient preference since the patient has regular follow up with an  ophthalmologist. Hearing and body mass index were assessed and  reviewed.    During the course of the visit the patient was educated and counseled about appropriate screening and preventive services including : fall prevention , diabetes screening, nutrition counseling, colorectal cancer screening, and recommended immunizations.    Chief Complaint:  1) return of pre parathyroidectomy fatigue.  2) DM:  reviewed recent labs and glycemic control.  Tolerating ozempic  does not want to increase dose.  Fastings  <130 and post prandials  160 or less.   3) Hyperlpidemia: tolerating atorvastaitn   4)  Hypertension: patient checks blood pressure twice weekly at home.  Readings have been for the most part  140/80 at rest . Patient is following a reduce salt diet most days and is taking medications as prescribed     Review of Symptoms  Patient denies headache, fevers, malaise, unintentional weight loss, skin rash, eye pain, sinus congestion and sinus pain, sore throat, dysphagia,  hemoptysis , cough, dyspnea, wheezing, chest pain, palpitations, orthopnea, edema, abdominal pain, nausea, melena, diarrhea, constipation, flank pain, dysuria, hematuria, urinary  Frequency, nocturia, numbness, tingling, seizures,  Focal weakness, Loss of consciousness,  Tremor, insomnia, depression, anxiety, and suicidal ideation.    Physical Exam:  BP 130/84 (BP Location: Left Arm, Patient Position: Sitting, Cuff Size: Normal)   Pulse 84   Temp (!) 97.4 F (36.3 C) (Oral)   Ht 5' 10.5" (1.791 m)   Wt 209 lb 12.8 oz (95.2 kg)   SpO2 96%   BMI 29.68 kg/m    General  appearance: alert, cooperative and appears stated age Ears: normal TM's and external ear canals both ears Throat: lips, mucosa, and tongue normal; teeth and gums normal Neck: no adenopathy, no carotid bruit, supple, symmetrical, trachea midline and thyroid not enlarged, symmetric, no tenderness/mass/nodules Back: symmetric, no curvature. ROM normal. No CVA tenderness. Lungs: clear to auscultation bilaterally Heart:  regular rate and rhythm, S1, S2 normal, no murmur, click, rub or gallop Abdomen: soft, non-tender; bowel sounds normal; no masses,  no organomegaly Pulses: 2+ and symmetric Skin: Skin color, texture, turgor normal. No rashes or lesions Lymph nodes: Cervical, supraclavicular, and axillary nodes normal.   Assessment and Plan:  Hyperlipidemia associated with type 2 diabetes mellitus (HCC) A1c has improved on current regimen of Ozempic and metformin.  He  Wants to lose weight without increasing the Ozempic dose. LDL is at goal on current atorvastatin dose (labs done n May at [facility] ) and LFTS are normal   Lab Results  Component Value Date   CHOL 128 12/24/2021   HDL 38 12/24/2021   LDLCALC 76 12/24/2021   LDLDIRECT 82 12/23/2014   TRIG 70 12/24/2021   CHOLHDL 4 01/23/2021     Primary hyperparathyroidism (HCC) He had one parathyroid gland removed in April with transient resolution of his symptoms of fatigue .  His PTH has been rising and he has become excessively fatigued again and having muscle cramps .  Rechecking calcium and PTH today   Hypertension Well controlled on current regimen of amlodipine and losartan/hct . Renal function stable, and he has no proteinuria (by UA done at [facility] in May 2023)   no changes today. Lab Results  Component Value Date   NA 139 12/24/2021   K 3.8 12/24/2021   CL 100 01/23/2021   CO2 28 (A) 12/24/2021   Lab Results  Component Value Date   CALCIUM 10.0 01/26/2021   Lab Results  Component Value Date   MICROALBUR <0.7 01/23/2021   MICROALBUR <0.7 01/17/2020       Encounter for preventive health examination age appropriate education and counseling updated, referrals for preventative services and immunizations addressed, dietary and smoking counseling addressed, most recent labs reviewed.  I have personally reviewed and have noted:   1) the patient's medical and social history 2) The pt's use of alcohol, tobacco, and illicit drugs 3) The  patient's current medications and supplements 4) Functional ability including ADL's, fall risk, home safety risk, hearing and visual impairment 5) Diet and physical activities 6) Evidence for depression or mood disorder 7) The patient's height, weight, and BMI have been recorded in the chart  I have made referrals, and provided counseling and education based on review of the above   Updated Medication List Outpatient Encounter Medications as of 01/24/2022  Medication Sig   amLODipine (NORVASC) 10 MG tablet TAKE 1 TABLET(10 MG) BY MOUTH DAILY   atorvastatin (LIPITOR) 20 MG tablet TAKE 1 TABLET(20 MG) BY MOUTH DAILY   Cholecalciferol (VITAMIN D3) 50 MCG (2000 UT) CAPS Take 1 capsule by mouth daily.   Continuous Blood Gluc Sensor (FREESTYLE LIBRE 14 DAY SENSOR) MISC by Other route every fourteen (14) days.   JARDIANCE 25 MG TABS tablet Take 25 mg by mouth daily.   losartan-hydrochlorothiazide (HYZAAR) 100-25 MG tablet Take 1 tablet by mouth daily.   metFORMIN (GLUCOPHAGE-XR) 500 MG 24 hr tablet Take 4 tablets (2,000 mg total) by mouth daily.   mometasone (ELOCON) 0.1 % cream Apply topically daily. KEEP ON FILE FOR FUTURE REFILLS  Multiple Vitamins-Minerals (MULTIVITAMIN WITH MINERALS) tablet Take 1 tablet by mouth daily.   OZEMPIC, 1 MG/DOSE, 4 MG/3ML SOPN Inject 1 mg into the skin once a week.   tacrolimus (PROTOPIC) 0.1 % ointment Apply topically as needed.   [DISCONTINUED] benzonatate (TESSALON) 200 MG capsule Take 1 capsule (200 mg total) by mouth 3 (three) times daily as needed for cough. (Patient not taking: Reported on 01/24/2022)   [DISCONTINUED] Fluocinolone Acetonide 0.01 % OIL Place 1 drop in ear(s) 2 (two) times daily as needed. (Patient not taking: Reported on 01/23/2021)   No facility-administered encounter medications on file as of 01/24/2022.     Lab Results  Component Value Date   PSA 0.52 01/23/2021   PSA 0.46 01/17/2020   PSA 0.50 03/03/2018

## 2022-01-24 NOTE — Assessment & Plan Note (Signed)

## 2022-01-24 NOTE — Assessment & Plan Note (Addendum)
He had one parathyroid gland removed in April with transient resolution of his symptoms of fatigue .  His PTH has been rising and he has become excessively fatigued again and having muscle cramps .  Rechecking calcium and PTH today

## 2022-01-24 NOTE — Assessment & Plan Note (Addendum)
A1c has improved on current regimen of Ozempic and metformin.  He  Wants to lose weight without increasing the Ozempic dose. LDL is at goal on current atorvastatin dose (labs done n May at [facility] ) and LFTS are normal   Lab Results  Component Value Date   CHOL 128 12/24/2021   HDL 38 12/24/2021   LDLCALC 76 12/24/2021   LDLDIRECT 82 12/23/2014   TRIG 70 12/24/2021   CHOLHDL 4 01/23/2021

## 2022-01-24 NOTE — Assessment & Plan Note (Signed)
Well controlled on current regimen of amlodipine and losartan/hct . Renal function stable, and he has no proteinuria (by UA done at [facility] in May 2023)   no changes today. Lab Results  Component Value Date   NA 139 12/24/2021   K 3.8 12/24/2021   CL 100 01/23/2021   CO2 28 (A) 12/24/2021   Lab Results  Component Value Date   CALCIUM 10.0 01/26/2021   Lab Results  Component Value Date   MICROALBUR <0.7 01/23/2021   MICROALBUR <0.7 01/17/2020

## 2022-01-26 ENCOUNTER — Encounter: Payer: Self-pay | Admitting: Internal Medicine

## 2022-01-26 LAB — PTH, INTACT AND CALCIUM
Calcium: 8.7 mg/dL (ref 8.6–10.3)
PTH: 50 pg/mL (ref 16–77)

## 2022-01-29 LAB — HM DIABETES EYE EXAM

## 2022-01-31 ENCOUNTER — Other Ambulatory Visit: Payer: Self-pay | Admitting: Internal Medicine

## 2022-01-31 ENCOUNTER — Encounter: Payer: Self-pay | Admitting: Internal Medicine

## 2022-01-31 ENCOUNTER — Other Ambulatory Visit: Payer: Self-pay

## 2022-01-31 DIAGNOSIS — E119 Type 2 diabetes mellitus without complications: Secondary | ICD-10-CM

## 2022-01-31 MED ORDER — OZEMPIC (1 MG/DOSE) 4 MG/3ML ~~LOC~~ SOPN: 1.0000 mg | PEN_INJECTOR | SUBCUTANEOUS | 3 refills | Status: DC

## 2022-01-31 MED ORDER — ATORVASTATIN CALCIUM 20 MG PO TABS: 20.0000 mg | ORAL_TABLET | Freq: Every day | ORAL | 0 refills | Status: DC

## 2022-02-15 ENCOUNTER — Encounter: Payer: Self-pay | Admitting: Internal Medicine

## 2022-02-15 MED ORDER — AMLODIPINE BESYLATE 10 MG PO TABS: 10.0000 mg | ORAL_TABLET | Freq: Every day | ORAL | 3 refills | Status: DC

## 2022-02-19 ENCOUNTER — Other Ambulatory Visit: Payer: Self-pay | Admitting: Internal Medicine

## 2022-04-24 ENCOUNTER — Other Ambulatory Visit: Payer: Self-pay | Admitting: Internal Medicine

## 2022-04-24 DIAGNOSIS — E119 Type 2 diabetes mellitus without complications: Secondary | ICD-10-CM

## 2022-05-27 ENCOUNTER — Other Ambulatory Visit: Payer: Self-pay | Admitting: Internal Medicine

## 2022-07-16 NOTE — Telephone Encounter (Signed)
MyChart messgae sent to patient. 

## 2022-07-24 ENCOUNTER — Ambulatory Visit: Payer: BC Managed Care – PPO | Admitting: Family Medicine

## 2022-07-24 ENCOUNTER — Encounter: Payer: Self-pay | Admitting: Family Medicine

## 2022-07-24 VITALS — BP 128/76 | HR 93 | Temp 97.2°F | Ht 70.5 in | Wt 206.4 lb

## 2022-07-24 DIAGNOSIS — J069 Acute upper respiratory infection, unspecified: Secondary | ICD-10-CM | POA: Diagnosis not present

## 2022-07-24 MED ORDER — BENZONATATE 200 MG PO CAPS: 200.0000 mg | ORAL_CAPSULE | Freq: Three times a day (TID) | ORAL | 1 refills | Status: DC | PRN

## 2022-07-24 NOTE — Patient Instructions (Addendum)
I think you have a viral upper respiratory infection / cold  Drink fluids  Get rest when you can   I will send in some tessalon for cough   You can try zyrtec over the counter for runny nose   Update if not starting to improve in a week or if worsening

## 2022-07-24 NOTE — Progress Notes (Signed)
Subjective:    Patient ID: Joshua Arellano, male    DOB: 19-Mar-1966, 56 y.o.   MRN: [id number]  HPI 56 yo pt of Dr Darrick Huntsman presents for sinus symptoms   Wt Readings from Last 3 Encounters:  07/24/22 206 lb 6 oz (93.6 kg)  01/24/22 209 lb 12.8 oz (95.2 kg)  04/24/21 193 lb 9.6 oz (87.8 kg)   29.19 kg/m  9 days of symptoms  ST and runny nose   He did a covid test-it was negative   Nasal discharge turned a little pale brown and then cleared again  Mucous is clear  Still has runny nose   Ears are full /tends to pop when he blows nose No pain   Cough-not sleeping very well Dry cough  No wheeze or sob or tightness   No headache No facial pain   Otc:  Nothing (afraid to take otc med)  Had 2 doses of sudafed last week  Intol of dexamethoraphan   Patient Active Problem List   Diagnosis Date Noted   Primary hyperparathyroidism (HCC) 01/24/2021   Diabetes mellitus type 2 in obese (HCC) 01/25/2020   Viral URI with cough 05/28/2016   Encounter for preventive health examination 01/30/2016   Fatigue 08/29/2015   Chronic venous insufficiency 07/05/2015   OSA (obstructive sleep apnea) 12/25/2014   Prostate cancer screening 04/30/2012   Obesity (BMI 30-39.9) 07/11/2011   Hemorrhoid    Hyperlipidemia associated with type 2 diabetes mellitus (HCC) 04/09/2011   Hypertension 04/09/2011   Past Medical History:  Diagnosis Date   COVID-19 virus infection 04/24/2021   Diabetes mellitus    Hemorrhoid    Hemorrhoids    Hyperlipidemia    Hypertension    Knee pain, left    episodic   Obesity (BMI 30-39.9)    Sleep apnea    Uses C-PAP machine   Past Surgical History:  Procedure Laterality Date   ANOSCOPY  10 years ago   Hemorrhoids   COLONOSCOPY WITH PROPOFOL N/A 02/28/2016   Procedure: COLONOSCOPY WITH PROPOFOL;  Surgeon: Earline Mayotte, MD;  Location: [facility] ENDOSCOPY;  Service: Endoscopy;  Laterality: N/A;   TONSILLECTOMY  1972   Social History   Tobacco Use    Smoking status: Never   Smokeless tobacco: Never  Vaping Use   Vaping Use: Never used  Substance Use Topics   Alcohol use: Yes    Comment: occasional   Drug use: No   Family History  Problem Relation Age of Onset   Transient ischemic attack Father    CAD Mother 72   Cancer Maternal Uncle        colon ca    Heart disease Maternal Grandfather    Hypertension Maternal Grandfather    Heart attack Maternal Grandfather    Hypertension Paternal Grandmother    Hypertension Paternal Grandfather    Allergies  Allergen Reactions   Lisinopril Cough    cough   Latex Itching   Current Outpatient Medications on File Prior to Visit  Medication Sig Dispense Refill   amLODipine (NORVASC) 10 MG tablet Take 1 tablet (10 mg total) by mouth daily. 90 tablet 3   atorvastatin (LIPITOR) 20 MG tablet TAKE 1 TABLET(20 MG) BY MOUTH DAILY 90 tablet 0   Cholecalciferol (VITAMIN D3) 50 MCG (2000 UT) CAPS Take 1 capsule by mouth daily.     Continuous Blood Gluc Sensor (FREESTYLE LIBRE 14 DAY SENSOR) MISC by Other route every fourteen (14) days.     JARDIANCE 25  MG TABS tablet Take 25 mg by mouth daily. 90 tablet 0   losartan-hydrochlorothiazide (HYZAAR) 100-25 MG tablet TAKE 1 TABLET BY MOUTH DAILY 90 tablet 0   metFORMIN (GLUCOPHAGE-XR) 500 MG 24 hr tablet Take 4 tablets (2,000 mg total) by mouth daily. 360 tablet 0   mometasone (ELOCON) 0.1 % cream Apply topically daily. KEEP ON FILE FOR FUTURE REFILLS 45 g 2   Multiple Vitamins-Minerals (MULTIVITAMIN WITH MINERALS) tablet Take 1 tablet by mouth daily.     OZEMPIC, 1 MG/DOSE, 4 MG/3ML SOPN Inject 1 mg into the skin once a week. (Patient taking differently: Inject 2 mg into the skin once a week.) 3 mL 3   tacrolimus (PROTOPIC) 0.1 % ointment Apply topically as needed. 100 g 1   No current facility-administered medications on file prior to visit.      Review of Systems  Constitutional:  Positive for fatigue. Negative for activity change, appetite  change, fever and unexpected weight change.  HENT:  Positive for postnasal drip, rhinorrhea and sore throat. Negative for congestion and trouble swallowing.   Eyes:  Negative for pain, redness, itching and visual disturbance.  Respiratory:  Positive for cough. Negative for chest tightness, shortness of breath, wheezing and stridor.   Cardiovascular:  Negative for chest pain and palpitations.  Gastrointestinal:  Negative for abdominal pain, blood in stool, constipation, diarrhea and nausea.  Endocrine: Negative for cold intolerance, heat intolerance, polydipsia and polyuria.  Genitourinary:  Negative for difficulty urinating, dysuria, frequency and urgency.  Musculoskeletal:  Negative for arthralgias, joint swelling and myalgias.  Skin:  Negative for pallor and rash.  Neurological:  Negative for dizziness, tremors, weakness, numbness and headaches.  Hematological:  Negative for adenopathy. Does not bruise/bleed easily.  Psychiatric/Behavioral:  Negative for decreased concentration and dysphoric mood. The patient is not nervous/anxious.        Objective:   Physical Exam Constitutional:      General: He is not in acute distress.    Appearance: Normal appearance. He is well-developed. He is obese. He is not ill-appearing, toxic-appearing or diaphoretic.  HENT:     Head: Normocephalic and atraumatic.     Comments: Nares are injected and congested      Right Ear: Tympanic membrane, ear canal and external ear normal.     Left Ear: Tympanic membrane, ear canal and external ear normal.     Nose: Congestion and rhinorrhea present.     Comments: Nares are boggy     Mouth/Throat:     Mouth: Mucous membranes are moist.     Pharynx: Oropharynx is clear. No oropharyngeal exudate or posterior oropharyngeal erythema.     Comments: Clear pnd  Eyes:     General:        Right eye: No discharge.        Left eye: No discharge.     Conjunctiva/sclera: Conjunctivae normal.     Pupils: Pupils are equal,  round, and reactive to light.  Cardiovascular:     Rate and Rhythm: Normal rate.     Heart sounds: Normal heart sounds.  Pulmonary:     Effort: Pulmonary effort is normal. No respiratory distress.     Breath sounds: Normal breath sounds. No stridor. No wheezing, rhonchi or rales.     Comments: No wheeze even on forced exp Chest:     Chest wall: No tenderness.  Musculoskeletal:     Cervical back: Normal range of motion and neck supple.  Lymphadenopathy:     Cervical:  No cervical adenopathy.  Skin:    General: Skin is warm and dry.     Capillary Refill: Capillary refill takes less than 2 seconds.     Findings: No rash.  Neurological:     Mental Status: He is alert.     Cranial Nerves: No cranial nerve deficit.  Psychiatric:        Mood and Affect: Mood normal.           Assessment & Plan:   Problem List Items Addressed This Visit       Respiratory   Viral URI with cough - Primary    Day 9 and clinically improving with rhinorrhea and cough Reassuring exam  Disc symptom care and callback precautions  Will watch for wheezing/ facial pain or fever  Recommend zyrtec 10 mg for runny nose/may also  help cough  Tessalon sent to pharmacy for cough (he does not tolerate DM products)   Update if not starting to improve in a week or if worsening

## 2022-07-24 NOTE — Assessment & Plan Note (Signed)
Day 9 and clinically improving with rhinorrhea and cough Reassuring exam  Disc symptom care and callback precautions  Will watch for wheezing/ facial pain or fever  Recommend zyrtec 10 mg for runny nose/may also  help cough  Tessalon sent to pharmacy for cough (he does not tolerate DM products)   Update if not starting to improve in a week or if worsening

## 2022-10-08 LAB — HEMOGLOBIN A1C: Hemoglobin A1C: 6.9

## 2022-12-02 ENCOUNTER — Other Ambulatory Visit: Payer: Self-pay | Admitting: Internal Medicine

## 2022-12-02 MED ORDER — LOSARTAN POTASSIUM-HCTZ 100-25 MG PO TABS: 1.0000 | ORAL_TABLET | Freq: Every day | ORAL | 0 refills | Status: DC

## 2023-01-27 ENCOUNTER — Encounter: Payer: Self-pay | Admitting: Internal Medicine

## 2023-01-27 ENCOUNTER — Ambulatory Visit (INDEPENDENT_AMBULATORY_CARE_PROVIDER_SITE_OTHER): Payer: BC Managed Care – PPO | Admitting: Internal Medicine

## 2023-01-27 VITALS — BP 138/80 | HR 77 | Temp 97.8°F | Ht 70.5 in | Wt 205.0 lb

## 2023-01-27 DIAGNOSIS — G4733 Obstructive sleep apnea (adult) (pediatric): Secondary | ICD-10-CM

## 2023-01-27 DIAGNOSIS — E785 Hyperlipidemia, unspecified: Secondary | ICD-10-CM | POA: Diagnosis not present

## 2023-01-27 DIAGNOSIS — Z7985 Long-term (current) use of injectable non-insulin antidiabetic drugs: Secondary | ICD-10-CM | POA: Diagnosis not present

## 2023-01-27 DIAGNOSIS — E1169 Type 2 diabetes mellitus with other specified complication: Secondary | ICD-10-CM

## 2023-01-27 DIAGNOSIS — I10 Essential (primary) hypertension: Secondary | ICD-10-CM | POA: Diagnosis not present

## 2023-01-27 DIAGNOSIS — E669 Obesity, unspecified: Secondary | ICD-10-CM

## 2023-01-27 DIAGNOSIS — Z6830 Body mass index (BMI) 30.0-30.9, adult: Secondary | ICD-10-CM

## 2023-01-27 DIAGNOSIS — Z125 Encounter for screening for malignant neoplasm of prostate: Secondary | ICD-10-CM

## 2023-01-27 DIAGNOSIS — E119 Type 2 diabetes mellitus without complications: Secondary | ICD-10-CM

## 2023-01-27 DIAGNOSIS — Z0000 Encounter for general adult medical examination without abnormal findings: Secondary | ICD-10-CM | POA: Diagnosis not present

## 2023-01-27 LAB — HEMOGLOBIN A1C: Hgb A1c MFr Bld: 7.1 % — ABNORMAL HIGH (ref 4.6–6.5)

## 2023-01-27 LAB — CBC WITH DIFFERENTIAL/PLATELET
Basophils Absolute: 0 10*3/uL (ref 0.0–0.1)
Basophils Relative: 0.6 % (ref 0.0–3.0)
Eosinophils Absolute: 0.1 10*3/uL (ref 0.0–0.7)
Eosinophils Relative: 1.9 % (ref 0.0–5.0)
HCT: 45.4 % (ref 39.0–52.0)
Hemoglobin: 15.3 g/dL (ref 13.0–17.0)
Lymphocytes Relative: 25.1 % (ref 12.0–46.0)
Lymphs Abs: 1.8 10*3/uL (ref 0.7–4.0)
MCHC: 33.8 g/dL (ref 30.0–36.0)
MCV: 87.4 fl (ref 78.0–100.0)
Monocytes Absolute: 0.7 10*3/uL (ref 0.1–1.0)
Monocytes Relative: 9.6 % (ref 3.0–12.0)
Neutro Abs: 4.5 10*3/uL (ref 1.4–7.7)
Neutrophils Relative %: 62.8 % (ref 43.0–77.0)
Platelets: 355 10*3/uL (ref 150.0–400.0)
RBC: 5.19 Mil/uL (ref 4.22–5.81)
RDW: 13.2 % (ref 11.5–15.5)
WBC: 7.2 10*3/uL (ref 4.0–10.5)

## 2023-01-27 LAB — COMPREHENSIVE METABOLIC PANEL
ALT: 25 U/L (ref 0–53)
AST: 20 U/L (ref 0–37)
Albumin: 4.5 g/dL (ref 3.5–5.2)
Alkaline Phosphatase: 56 U/L (ref 39–117)
BUN: 11 mg/dL (ref 6–23)
CO2: 31 mEq/L (ref 19–32)
Calcium: 10 mg/dL (ref 8.4–10.5)
Chloride: 100 mEq/L (ref 96–112)
Creatinine, Ser: 0.96 mg/dL (ref 0.40–1.50)
GFR: 86.86 mL/min (ref >60.00)
Glucose, Bld: 132 mg/dL — ABNORMAL HIGH (ref 70–99)
Potassium: 4.1 mEq/L (ref 3.5–5.1)
Sodium: 140 mEq/L (ref 135–145)
Total Bilirubin: 0.7 mg/dL (ref 0.2–1.2)
Total Protein: 7.2 g/dL (ref 6.0–8.3)

## 2023-01-27 LAB — MICROALBUMIN / CREATININE URINE RATIO
Creatinine,U: 28.2 mg/dL
Microalb Creat Ratio: 2.5 mg/g (ref 0.0–30.0)
Microalb, Ur: <0.7 mg/dL (ref 0.0–1.9)

## 2023-01-27 LAB — PSA: PSA: 0.6 ng/mL (ref 0.10–4.00)

## 2023-01-27 LAB — LIPID PANEL
Cholesterol: 135 mg/dL (ref 0–200)
HDL: 40.1 mg/dL (ref >39.00)
LDL Cholesterol: 75 mg/dL (ref 0–99)
NonHDL: 94.92
Total CHOL/HDL Ratio: 3
Triglycerides: 100 mg/dL (ref 0.0–149.0)
VLDL: 20 mg/dL (ref 0.0–40.0)

## 2023-01-27 LAB — LDL CHOLESTEROL, DIRECT: Direct LDL: 83 mg/dL

## 2023-01-27 LAB — TSH: TSH: 1.88 u[IU]/mL (ref 0.35–5.50)

## 2023-01-27 MED ORDER — AMLODIPINE BESYLATE 10 MG PO TABS: 10.0000 mg | ORAL_TABLET | Freq: Every day | ORAL | 3 refills | Status: DC

## 2023-01-27 MED ORDER — LOSARTAN POTASSIUM-HCTZ 100-25 MG PO TABS: 1.0000 | ORAL_TABLET | Freq: Every day | ORAL | 3 refills | Status: DC

## 2023-01-27 MED ORDER — TRAZODONE HCL 50 MG PO TABS: 25.0000 mg | ORAL_TABLET | Freq: Every evening | ORAL | 3 refills | Status: DC | PRN

## 2023-01-27 MED ORDER — ATORVASTATIN CALCIUM 20 MG PO TABS: ORAL_TABLET | ORAL | 3 refills | Status: DC

## 2023-01-27 NOTE — Progress Notes (Signed)
Patient ID: Joshua Arellano, male    DOB: 04-20-1966  Age: 57 y.o. MRN: [id number]  The patient is here for annual preventive examination and management of other chronic and acute problems.   The risk factors are reflected in the social history.   The roster of all physicians providing medical care to patient - is listed in the Snapshot section of the chart.   Activities of daily living:  The patient is 100% independent in all ADLs: dressing, toileting, feeding as well as independent mobility   Home safety : The patient has smoke detectors in the home. They wear seatbelts.  There are no unsecured firearms at home. There is no violence in the home.    There is no risks for hepatitis, STDs or HIV. There is no   history of blood transfusion. They have no travel history to infectious disease endemic areas of the world.   The patient has seen their dentist in the last six month. They have seen their eye doctor in the last year. The patinet  denies slight hearing difficulty with regard to whispered voices and some television programs.  They have deferred audiologic testing in the last year.  They do not  have excessive sun exposure. Discussed the need for sun protection: hats, long sleeves and use of sunscreen if there is significant sun exposure.    Diet: the importance of a healthy diet is discussed. They do have a healthy diet.   The benefits of regular aerobic exercise were discussed. The patient  exercises  3 to 5 days per week  for  60 minutes.    Depression screen: there are no signs or vegative symptoms of depression- irritability, change in appetite, anhedonia, sadness/tearfullness.   The following portions of the patient's history were reviewed and updated as appropriate: allergies, current medications, past family history, past medical history,  past surgical history, past social history  and problem list.   Visual acuity was not assessed per patient preference since the patient has  regular follow up with an  ophthalmologist. Hearing and body mass index were assessed and reviewed.    During the course of the visit the patient was educated and counseled about appropriate screening and preventive services including : fall prevention , diabetes screening, nutrition counseling, colorectal cancer screening, and recommended immunizations.    Chief Complaint:   1) diarrhea :  attributed to metformin has  been occurring for years.  Multiple loose stools occurring nearly daily,  worse depending on  what he eats.  Has talked with his endocrinologist about suspending medicaiton or reducing the dose but she has not.    2) waking up  2-3 times per week at 3 am , usually due to a disturbance   Review of Symptoms  Patient denies headache, fevers, malaise, unintentional weight loss, skin rash, eye pain, sinus congestion and sinus pain, sore throat, dysphagia,  hemoptysis , cough, dyspnea, wheezing, chest pain, palpitations, orthopnea, edema, abdominal pain, nausea, melena, diarrhea, constipation, flank pain, dysuria, hematuria, urinary  Frequency, nocturia, numbness, tingling, seizures,  Focal weakness, Loss of consciousness,  Tremor, insomnia, depression, anxiety, and suicidal ideation.    Physical Exam:  BP 138/80   Pulse 77   Temp 97.8 F (36.6 C) (Oral)   Ht 5' 10.5" (1.791 m)   Wt 205 lb (93 kg)   SpO2 97%   BMI 29.00 kg/m    Physical Exam Vitals reviewed.  Constitutional:      General: He is not in  acute distress.    Appearance: Normal appearance. He is normal weight. He is not ill-appearing, toxic-appearing or diaphoretic.  HENT:     Head: Normocephalic.  Eyes:     General: No scleral icterus.       Right eye: No discharge.        Left eye: No discharge.     Conjunctiva/sclera: Conjunctivae normal.  Cardiovascular:     Rate and Rhythm: Normal rate and regular rhythm.     Heart sounds: Normal heart sounds.  Pulmonary:     Effort: Pulmonary effort is normal. No  respiratory distress.     Breath sounds: Normal breath sounds.  Musculoskeletal:        General: Normal range of motion.     Cervical back: Normal range of motion.  Skin:    General: Skin is warm and dry.  Neurological:     General: No focal deficit present.     Mental Status: He is alert and oriented to person, place, and time. Mental status is at baseline.  Psychiatric:        Mood and Affect: Mood normal.        Behavior: Behavior normal.        Thought Content: Thought content normal.        Judgment: Judgment normal.    Assessment and Plan: Prostate cancer screening -     PSA  Diabetes mellitus without complication (HCC) -     Comprehensive metabolic panel -     Microalbumin / creatinine urine ratio -     Hemoglobin A1c  Primary hypertension Assessment & Plan: Elevated today on max dose amlodipne  and losartan hct .  Has untreated OSA based on 2017   stdy   Orders: -     Comprehensive metabolic panel -     Microalbumin / creatinine urine ratio  Hyperlipidemia associated with type 2 diabetes mellitus (HCC) Assessment & Plan: . LDL is at goal on current atorvastatin dose (labs done n May at [facility] ) and LFTS are normal   Lab Results  Component Value Date   CHOL 135 01/27/2023   HDL 40.10 01/27/2023   LDLCALC 75 01/27/2023   LDLDIRECT 83.0 01/27/2023   TRIG 100.0 01/27/2023   CHOLHDL 3 01/27/2023     Orders: -     Lipid panel -     LDL cholesterol, direct -     TSH  Obesity (BMI 30-39.9) -     CBC with Differential/Platelet -     TSH  Encounter for preventive health examination  OSA (obstructive sleep apnea) Assessment & Plan: Untreated for years.  Denies symptoms currently .  Wife notes reduced snoring since he lost weight and he is sleeping on his side.    Reviewed the LT effects of non treatment    Type 2 diabetes mellitus with obesity (HCC) Assessment & Plan: Currently well-controlled on current medications.  Patient is reminded to schedule an  annual eye exam and foot exam is normal today. Patient has no microalbuminuria. Patient is tolerating statin therapy for CAD risk reduction and on ACE/ARB for renal protection and hypertension .  Recommending suspending or reducing dose of metformin give his persistent diarrhea/   Lab Results  Component Value Date   HGBA1C 7.1 (H) 01/27/2023   Lab Results  Component Value Date   MICROALBUR <0.7 01/27/2023   MICROALBUR <0.7 01/23/2021   .     Long-term current use of injectable noninsulin antidiabetic medication Assessment & Plan:  He is taking   2  mg ozempic.    Other orders -     amLODIPine Besylate; Take 1 tablet (10 mg total) by mouth daily.  Dispense: 90 tablet; Refill: 3 -     Atorvastatin Calcium; TAKE 1 TABLET(20 MG) BY MOUTH DAILY  Dispense: 90 tablet; Refill: 3 -     Losartan Potassium-HCTZ; Take 1 tablet by mouth daily.  Dispense: 90 tablet; Refill: 3 -     traZODone HCl; Take 0.5-1 tablets (25-50 mg total) by mouth at bedtime as needed for sleep.  Dispense: 30 tablet; Refill: 3    No follow-ups on file.  Sherlene Shams, MD

## 2023-01-27 NOTE — Assessment & Plan Note (Signed)
Elevated today on max dose amlodipne  and losartan hct .  Has untreated OSA based on 2017   stdy

## 2023-01-27 NOTE — Assessment & Plan Note (Addendum)
Currently well-controlled on current medications.  Patient is reminded to schedule an annual eye exam and foot exam is normal today. Patient has no microalbuminuria. Patient is tolerating statin therapy for CAD risk reduction and on ACE/ARB for renal protection and hypertension .  Recommending suspending or reducing dose of metformin give his persistent diarrhea/   Lab Results  Component Value Date   HGBA1C 7.1 (H) 01/27/2023   Lab Results  Component Value Date   MICROALBUR <0.7 01/27/2023   MICROALBUR <0.7 01/23/2021   .

## 2023-01-27 NOTE — Assessment & Plan Note (Signed)
He is taking   2  mg ozempic.

## 2023-01-27 NOTE — Assessment & Plan Note (Signed)
Untreated for years.  Denies symptoms currently .  Wife notes reduced snoring since he lost weight and he is sleeping on his side.    Reviewed the LT effects of non treatment

## 2023-01-27 NOTE — Patient Instructions (Addendum)
Consider reducing your metformin dose to 1000 mg daily to see if the loose stools improve  The alternative is to try taking 2 mg of Imodium daily In the morning when you wake up    Trial of trazodone for the waking issues . Take at bedtime   Your labs will be resulted by late afternoon   The new goals for optimal blood pressure management are 120/70 to 130/80.  Please check your blood pressure a few times at home and send me the readings so I can determine if you need a change in medication

## 2023-01-27 NOTE — Assessment & Plan Note (Signed)
.   LDL is at goal on current atorvastatin dose (labs done n May at [facility] ) and LFTS are normal   Lab Results  Component Value Date   CHOL 135 01/27/2023   HDL 40.10 01/27/2023   LDLCALC 75 01/27/2023   LDLDIRECT 83.0 01/27/2023   TRIG 100.0 01/27/2023   CHOLHDL 3 01/27/2023

## 2023-01-31 LAB — HM DIABETES EYE EXAM

## 2023-08-18 ENCOUNTER — Telehealth: Payer: Self-pay

## 2023-08-18 NOTE — Telephone Encounter (Signed)
 Copied from CRM [id number]. 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 Please write letter for patient or print documentation indicating he is not using a CPAP any longer and call patient once it is ready for pickup.

## 2023-08-20 ENCOUNTER — Encounter: Payer: Self-pay | Admitting: Internal Medicine

## 2023-08-20 NOTE — Telephone Encounter (Signed)
LVM and sent mychart msg to pt.

## 2023-10-07 ENCOUNTER — Other Ambulatory Visit: Payer: Self-pay | Admitting: Internal Medicine

## 2024-01-29 ENCOUNTER — Encounter: Payer: Self-pay | Admitting: Internal Medicine

## 2024-01-29 ENCOUNTER — Ambulatory Visit (INDEPENDENT_AMBULATORY_CARE_PROVIDER_SITE_OTHER): Payer: BC Managed Care – PPO | Admitting: Internal Medicine

## 2024-01-29 DIAGNOSIS — E785 Hyperlipidemia, unspecified: Secondary | ICD-10-CM | POA: Diagnosis not present

## 2024-01-29 DIAGNOSIS — M79672 Pain in left foot: Secondary | ICD-10-CM

## 2024-01-29 DIAGNOSIS — Z7985 Long-term (current) use of injectable non-insulin antidiabetic drugs: Secondary | ICD-10-CM | POA: Diagnosis not present

## 2024-01-29 DIAGNOSIS — Z0000 Encounter for general adult medical examination without abnormal findings: Secondary | ICD-10-CM | POA: Diagnosis not present

## 2024-01-29 DIAGNOSIS — I10 Essential (primary) hypertension: Secondary | ICD-10-CM

## 2024-01-29 DIAGNOSIS — G4733 Obstructive sleep apnea (adult) (pediatric): Secondary | ICD-10-CM | POA: Diagnosis not present

## 2024-01-29 DIAGNOSIS — R5383 Other fatigue: Secondary | ICD-10-CM | POA: Diagnosis not present

## 2024-01-29 DIAGNOSIS — E669 Obesity, unspecified: Secondary | ICD-10-CM

## 2024-01-29 DIAGNOSIS — G8929 Other chronic pain: Secondary | ICD-10-CM

## 2024-01-29 DIAGNOSIS — E1169 Type 2 diabetes mellitus with other specified complication: Secondary | ICD-10-CM | POA: Diagnosis not present

## 2024-01-29 DIAGNOSIS — Z125 Encounter for screening for malignant neoplasm of prostate: Secondary | ICD-10-CM

## 2024-01-29 LAB — LIPID PANEL
Cholesterol: 119 mg/dL (ref 0–200)
HDL: 43.4 mg/dL (ref >39.00)
LDL Cholesterol: 55 mg/dL (ref 0–99)
NonHDL: 75.45
Total CHOL/HDL Ratio: 3
Triglycerides: 100 mg/dL (ref 0.0–149.0)
VLDL: 20 mg/dL (ref 0.0–40.0)

## 2024-01-29 LAB — COMPREHENSIVE METABOLIC PANEL WITH GFR
ALT: 18 U/L (ref 0–53)
AST: 16 U/L (ref 0–37)
Albumin: 4.1 g/dL (ref 3.5–5.2)
Alkaline Phosphatase: 49 U/L (ref 39–117)
BUN: 9 mg/dL (ref 6–23)
CO2: 32 meq/L (ref 19–32)
Calcium: 9.4 mg/dL (ref 8.4–10.5)
Chloride: 103 meq/L (ref 96–112)
Creatinine, Ser: 0.83 mg/dL (ref 0.40–1.50)
GFR: 95.51 mL/min (ref >60.00)
Glucose, Bld: 117 mg/dL — ABNORMAL HIGH (ref 70–99)
Potassium: 4.1 meq/L (ref 3.5–5.1)
Sodium: 140 meq/L (ref 135–145)
Total Bilirubin: 0.4 mg/dL (ref 0.2–1.2)
Total Protein: 6.2 g/dL (ref 6.0–8.3)

## 2024-01-29 LAB — LDL CHOLESTEROL, DIRECT: Direct LDL: 59 mg/dL

## 2024-01-29 LAB — HEMOGLOBIN A1C: Hgb A1c MFr Bld: 6.4 % (ref 4.6–6.5)

## 2024-01-29 LAB — MICROALBUMIN / CREATININE URINE RATIO
Creatinine,U: 14.6 mg/dL
Microalb Creat Ratio: UNDETERMINED mg/g (ref 0.0–30.0)
Microalb, Ur: <0.7 mg/dL

## 2024-01-29 LAB — PSA: PSA: 0.54 ng/mL (ref 0.10–4.00)

## 2024-01-29 MED ORDER — AMLODIPINE BESYLATE 10 MG PO TABS: 10.0000 mg | ORAL_TABLET | Freq: Every day | ORAL | 3 refills | Status: DC

## 2024-01-29 MED ORDER — LOSARTAN POTASSIUM-HCTZ 100-25 MG PO TABS: 1.0000 | ORAL_TABLET | Freq: Every day | ORAL | 3 refills | Status: DC

## 2024-01-29 MED ORDER — METFORMIN HCL ER 500 MG PO TB24: 2000.0000 mg | ORAL_TABLET | Freq: Every day | ORAL | 0 refills | Status: DC

## 2024-01-29 MED ORDER — ATORVASTATIN CALCIUM 20 MG PO TABS: ORAL_TABLET | ORAL | 3 refills | Status: DC

## 2024-01-29 MED ORDER — MOMETASONE FUROATE 0.1 % EX CREA: TOPICAL_CREAM | Freq: Every day | CUTANEOUS | 2 refills | Status: AC

## 2024-01-29 MED ORDER — JARDIANCE 25 MG PO TABS: 25.0000 mg | ORAL_TABLET | Freq: Every day | ORAL | 0 refills | Status: AC

## 2024-01-29 NOTE — Patient Instructions (Signed)
 Referrals to 1) podiatry and 2) home sleep study   HAVE BEEN ORDERED  If you ARE NOT CONTACTED  WITHIN 2 WEEKS OF TODAY,  PLASE LET ME KNOW.  CHECK YOUR MYCHART FOR NOTIFICATION BECAUSE REFERRALS ARE NOW BEING HANDLED IN VARIOUS MANNERS DEPENDING ON THE INTEGRITY OF THE WORKER

## 2024-01-29 NOTE — Assessment & Plan Note (Addendum)
 Diagnosed in 2007 but untreated due to patient preference.  per wife he is no longer snoring since he lost  60 lbs.   Repeat sleep study ordered

## 2024-01-29 NOTE — Progress Notes (Signed)
 Patient ID: Joshua Arellano, male    DOB: 05-14-1966  Age: 58 y.o. MRN: [id number]  The patient is here for annual preventive examination and management of other chronic and acute problems.   The risk factors are reflected in the social history.   The roster of all physicians providing medical care to patient - is listed in the Snapshot section of the chart.   Activities of daily living:  The patient is 100% independent in all ADLs: dressing, toileting, feeding as well as independent mobility   Home safety : The patient has smoke detectors in the home. They wear seatbelts.  There are no unsecured firearms at home. There is no violence in the home.    There is no risks for hepatitis, STDs or HIV. There is no   history of blood transfusion. They have no travel history to infectious disease endemic areas of the world.   The patient has seen their dentist in the last six month. They have seen their eye doctor in the last year. The patinet  denies slight hearing difficulty with regard to whispered voices and some television programs.  They have deferred audiologic testing in the last year.  They do not  have excessive sun exposure. Discussed the need for sun protection: hats, long sleeves and use of sunscreen if there is significant sun exposure.    Diet: the importance of a healthy diet is discussed. They do have a healthy diet.   The benefits of regular aerobic exercise were discussed. The patient  exercises  3 to 5 days per week  for  60 minutes.    Depression screen: there are no signs or vegative symptoms of depression- irritability, change in appetite, anhedonia, sadness/tearfullness.   The following portions of the patient's history were reviewed and updated as appropriate: allergies, current medications, past family history, past medical history,  past surgical history, past social history  and problem list.   Visual acuity was not assessed per patient preference since the patient has  regular follow up with an  ophthalmologist. Hearing and body mass index were assessed and reviewed.    During the course of the visit the patient was educated and counseled about appropriate screening and preventive services including : fall prevention , diabetes screening, nutrition counseling, colorectal cancer screening, and recommended immunizations.    Chief Complaint:   He feels generally well, is exercising several times per week and checking blood sugars once daily at variable times.  BS have been under 130 fasting and < 150 post prandially.  Denies any recent hypoglyemic events.  Taking his medications as directed. Following a carbohydrate modified diet 6 days per week. Denies numbness, burning and tingling of extremities. Appetite is good.   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    Physical Exam:  There were no vitals taken for this visit.   Physical Exam Vitals reviewed.  Constitutional:      General: He is  not in acute distress.    Appearance: Normal appearance. He is normal weight. He is not ill-appearing, toxic-appearing or diaphoretic.  HENT:     Head: Normocephalic and atraumatic.     Right Ear: Tympanic membrane, ear canal and external ear normal. There is no impacted cerumen.     Left Ear: Tympanic membrane, ear canal and external ear normal. There is no impacted  cerumen.     Nose: Nose normal.     Mouth/Throat:     Mouth: Mucous membranes are moist.     Pharynx: Oropharynx is clear.   Eyes:     General: No scleral icterus.       Right eye: No discharge.        Left eye: No discharge.     Conjunctiva/sclera: Conjunctivae normal.   Neck:     Thyroid : No thyromegaly.     Vascular: No carotid bruit or JVD.   Cardiovascular:     Rate and Rhythm: Normal rate and regular rhythm.     Heart sounds: Normal heart sounds.  Pulmonary:     Effort: Pulmonary effort is normal. No respiratory distress.     Breath sounds: Normal breath sounds.  Abdominal:     General: Bowel sounds are normal.     Palpations: Abdomen is soft. There is no mass.     Tenderness: There is no abdominal tenderness. There is no guarding or rebound.   Musculoskeletal:        General: Normal range of motion.     Cervical back: Normal range of motion and neck supple.  Lymphadenopathy:     Cervical: No cervical adenopathy.   Skin:    General: Skin is warm and dry.   Neurological:     General: No focal deficit present.     Mental Status: He is alert and oriented to person, place, and time. Mental status is at baseline.   Psychiatric:        Mood and Affect: Mood normal.        Behavior: Behavior normal.        Thought Content: Thought content normal.        Judgment: Judgment normal.    Assessment and Plan: Encounter for preventive health examination  Primary hypertension Assessment & Plan: Improved control on  on max dose amlodipne  and losartan  hct .secodary to weight loss.    Orders: -     Comprehensive metabolic panel with GFR -     Microalbumin / creatinine urine ratio  Hyperlipidemia associated with type 2 diabetes mellitus (HCC) Assessment & Plan: . LDL is at goal on current atorvastatin  dose and LFTS are normal   Lab Results  Component Value Date   CHOL 119 01/29/2024   HDL 43.40 01/29/2024   LDLCALC 55 01/29/2024   LDLDIRECT 59.0 01/29/2024    TRIG 100.0 01/29/2024   CHOLHDL 3 01/29/2024     Orders: -     Lipid panel -     LDL cholesterol, direct  Type 2 diabetes mellitus with obesity (HCC) Assessment & Plan: His diabetes remains under excellent control  And his cholesterol and other labs are also normal. . He was advised to suspend metformin  due to chornic daily diarrhea  and return in 6 monts  Lab Results  Component Value Date   HGBA1C 6.4 01/29/2024   Lab Results  Component Value Date   MICROALBUR <0.7 01/29/2024   MICROALBUR <0.2 12/23/2014     Lab Results  Component Value Date  CHOL 119 01/29/2024   HDL 43.40 01/29/2024   LDLCALC 55 01/29/2024   LDLDIRECT 59.0 01/29/2024   TRIG 100.0 01/29/2024   CHOLHDL 3 01/29/2024   Lab Results  Component Value Date   CREATININE 0.83 01/29/2024     Orders: -     Comprehensive metabolic panel with GFR -     Hemoglobin A1c -     Microalbumin / creatinine urine ratio  Prostate cancer screening Assessment & Plan: Annual PSAs have ben noarml and stable  Lab Results  Component Value Date   PSA 0.54 01/29/2024   PSA 0.60 01/27/2023   PSA 0.46 01/24/2022      Orders: -     PSA  Other fatigue -     CBC with Differential/Platelet  Foot pain, left -     Ambulatory referral to Podiatry  OSA (obstructive sleep apnea) Assessment & Plan: Diagnosed in 2007 but untreated due to patient preference.  per wife he is no longer snoring since he lost  60 lbs.   Repeat sleep study ordered   Orders: -     PSG SLEEP STUDY; Future  Long-term current use of injectable noninsulin antidiabetic medication Assessment & Plan: He is taking   2  mg ozempic . He has had no thyromegaly or symptoms concerning for pancreatitis    Chronic foot pain, left Assessment & Plan: Exam suggestive of Cristopher' neuroma . Referring to podiatry for evaluation    Other orders -     amLODIPine  Besylate; Take 1 tablet (10 mg total) by mouth daily.  Dispense: 90 tablet; Refill: 3 -      Atorvastatin  Calcium ; TAKE 1 TABLET(20 MG) BY MOUTH DAILY  Dispense: 90 tablet; Refill: 3 -     Jardiance ; Take 1 tablet (25 mg total) by mouth daily.  Dispense: 90 tablet; Refill: 0 -     Losartan  Potassium-HCTZ; Take 1 tablet by mouth daily.  Dispense: 90 tablet; Refill: 3 -     metFORMIN  HCl ER; Take 4 tablets (2,000 mg total) by mouth daily.  Dispense: 360 tablet; Refill: 0 -     Mometasone  Furoate; Apply topically daily. KEEP ON FILE FOR FUTURE REFILLS  Dispense: 45 g; Refill: 2    Return in about 6 months (around 07/30/2024).  Verneita LITTIE Kettering, MD

## 2024-01-30 LAB — CBC WITH DIFFERENTIAL/PLATELET
Basophils Absolute: 0 10*3/uL (ref 0.0–0.1)
Basophils Relative: 0.5 % (ref 0.0–3.0)
Eosinophils Absolute: 0.2 10*3/uL (ref 0.0–0.7)
Eosinophils Relative: 2.2 % (ref 0.0–5.0)
HCT: 43 % (ref 39.0–52.0)
Hemoglobin: 14.6 g/dL (ref 13.0–17.0)
Lymphocytes Relative: 21.4 % (ref 12.0–46.0)
Lymphs Abs: 1.5 10*3/uL (ref 0.7–4.0)
MCHC: 33.9 g/dL (ref 30.0–36.0)
MCV: 86.9 fl (ref 78.0–100.0)
Monocytes Absolute: 0.7 10*3/uL (ref 0.1–1.0)
Monocytes Relative: 9.6 % (ref 3.0–12.0)
Neutro Abs: 4.7 10*3/uL (ref 1.4–7.7)
Neutrophils Relative %: 66.3 % (ref 43.0–77.0)
Platelets: 308 10*3/uL (ref 150.0–400.0)
RBC: 4.94 Mil/uL (ref 4.22–5.81)
RDW: 13.2 % (ref 11.5–15.5)
WBC: 7 10*3/uL (ref 4.0–10.5)

## 2024-02-01 ENCOUNTER — Ambulatory Visit: Payer: Self-pay | Admitting: Internal Medicine

## 2024-02-01 DIAGNOSIS — G8929 Other chronic pain: Secondary | ICD-10-CM | POA: Insufficient documentation

## 2024-02-01 NOTE — Assessment & Plan Note (Signed)
 Exam suggestive of Cristopher' neuroma . Referring to podiatry for evaluation

## 2024-02-01 NOTE — Assessment & Plan Note (Signed)
 He is taking   2  mg ozempic . He has had no thyromegaly or symptoms concerning for pancreatitis

## 2024-02-01 NOTE — Assessment & Plan Note (Addendum)
.   LDL is at goal on current atorvastatin  dose and LFTS are normal   Lab Results  Component Value Date   CHOL 119 01/29/2024   HDL 43.40 01/29/2024   LDLCALC 55 01/29/2024   LDLDIRECT 59.0 01/29/2024   TRIG 100.0 01/29/2024   CHOLHDL 3 01/29/2024

## 2024-02-01 NOTE — Assessment & Plan Note (Signed)
 Annual PSAs have ben noarml and stable  Lab Results  Component Value Date   PSA 0.54 01/29/2024   PSA 0.60 01/27/2023   PSA 0.46 01/24/2022

## 2024-02-01 NOTE — Assessment & Plan Note (Addendum)
 His diabetes remains under excellent control  And his cholesterol and other labs are also normal. . He was advised to suspend metformin  due to chornic daily diarrhea  and return in 6 monts  Lab Results  Component Value Date   HGBA1C 6.4 01/29/2024   Lab Results  Component Value Date   MICROALBUR <0.7 01/29/2024   MICROALBUR <0.2 12/23/2014     Lab Results  Component Value Date   CHOL 119 01/29/2024   HDL 43.40 01/29/2024   LDLCALC 55 01/29/2024   LDLDIRECT 59.0 01/29/2024   TRIG 100.0 01/29/2024   CHOLHDL 3 01/29/2024   Lab Results  Component Value Date   CREATININE 0.83 01/29/2024

## 2024-02-01 NOTE — Assessment & Plan Note (Signed)
 Improved control on  on max dose amlodipne  and losartan  hct .secodary to weight loss.

## 2024-02-03 LAB — HM DIABETES EYE EXAM

## 2024-02-11 ENCOUNTER — Ambulatory Visit: Payer: Self-pay | Admitting: Podiatry

## 2024-02-11 DIAGNOSIS — M7742 Metatarsalgia, left foot: Secondary | ICD-10-CM

## 2024-02-11 DIAGNOSIS — B070 Plantar wart: Secondary | ICD-10-CM

## 2024-02-11 NOTE — Progress Notes (Signed)
 Subjective:  Patient ID: Joshua Arellano, male    DOB: 09/05/1965,  MRN: [id number]  Chief Complaint  Patient presents with   Plantar Warts    Rm 7 Patient is here for a possible plantar wart on the ball of the left foot below the index toe. Patient states feeling the hardened area for the past year and pain started three weeks ago after prolonged standing or walking. Patient states no current treatment at home for the pain.    Discussed the use of AI scribe software for clinical note transcription with the patient, who gave verbal consent to proceed.  History of Present Illness Joshua Arellano is a 58 year old male with diabetes who presents with left foot pain.  He noticed a lesion on the bottom of his left foot approximately two months ago, initially thought to be a callus. Over the past three to four months, the lesion has become painful, particularly when walking, although he describes the pain as more of an irritation than severe discomfort. The pain is localized to the area of the lesion and is exacerbated by walking. He has been using a pair of shoes with extra padding to alleviate discomfort.  He has a history of diabetes, with a recent A1c of 6.4. He has previously used orthotics, which he found awkward and uncomfortable due to their hardness, and has not used them recently. 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  No burning or tingling in the toes.      Objective:    Physical Exam VASCULAR: DP and PT pulse palpable. Foot is warm and well-perfused. Capillary fill time is brisk. DERMATOLOGIC: Normal skin turgor, texture, and temperature. No open lesions, rashes, or ulcerations. Small skin lesion consistent with pinpoint verruca plantaris submetatarsal two at digital sulcus, mildly tender. NEUROLOGIC: Normal sensation to light touch and pressure. No paresthesias on  examination. ORTHOPEDIC: Smooth pain-free range of motion of all examined joints. No ecchymosis or bruising. No gross deformity. No pain to palpation. No pain on metatarsal head, negative Lachman test of second MTP joint without instability. No evidence of neuroma to palpation first or second interspace.   No images are attached to the encounter.    Results Procedure: Skin lesion removal Description: The skin lesion was debrided sharply and the central core enucleated. Salicylic acid was applied to the lesion.    LABS A1c: 6.4     Assessment:   1. Verruca plantaris   2. Metatarsalgia of left foot      Plan:  Patient was evaluated and treated and all questions answered.  Assessment and Plan Assessment & Plan Verruca plantaris (plantar wart) Small skin lesion consistent with a pinpoint verruca plantaris located submetatarsal two at the digital sulcus. Mildly tender and causing discomfort when walking. Differential diagnosis includes corn or metatarsalgia due to prominent metatarsal heads and toe curling. No evidence of neuroma or ligament damage. - Trim down the skin lesion and apply salicylic acid. - Instruct to leave the treatment on for 24 hours, then wash with soap and water. - Advise that the skin will peel in a few days and to allow a few weeks for healing. - Recommend using corn or wart remover pads with 40% salicylic acid if the lesion recurs. - Provide orthopedic felt pads to alleviate pressure if soreness persists. - Consider new orthotics with metatarsal pad if pain persists after lesion  resolution.  Diabetes mellitus without complications Diabetes is well-controlled with a recent A1c of 6.4%.      Return if symptoms worsen or fail to improve.

## 2024-02-20 ENCOUNTER — Encounter: Payer: Self-pay | Admitting: Internal Medicine

## 2024-03-01 ENCOUNTER — Telehealth: Payer: Self-pay

## 2024-03-01 DIAGNOSIS — G4733 Obstructive sleep apnea (adult) (pediatric): Secondary | ICD-10-CM

## 2024-03-01 NOTE — Telephone Encounter (Signed)
 Pt is aware of results and CPAP order has been faxed to [facility].

## 2024-03-01 NOTE — Telephone Encounter (Signed)
 Sleep study report has been placed in yellow results folder for review.

## 2024-03-05 LAB — HEMOGLOBIN A1C: Hemoglobin A1C: 6.1

## 2024-03-16 ENCOUNTER — Other Ambulatory Visit: Payer: Self-pay

## 2024-03-16 MED ORDER — TRAZODONE HCL 50 MG PO TABS: ORAL_TABLET | ORAL | 3 refills | Status: DC

## 2024-04-30 ENCOUNTER — Ambulatory Visit: Admitting: Internal Medicine

## 2024-04-30 VITALS — BP 124/68 | HR 87 | Ht 70.5 in | Wt 192.4 lb

## 2024-04-30 DIAGNOSIS — E669 Obesity, unspecified: Secondary | ICD-10-CM | POA: Diagnosis not present

## 2024-04-30 DIAGNOSIS — E1169 Type 2 diabetes mellitus with other specified complication: Secondary | ICD-10-CM

## 2024-04-30 DIAGNOSIS — Z0000 Encounter for general adult medical examination without abnormal findings: Secondary | ICD-10-CM | POA: Diagnosis not present

## 2024-04-30 DIAGNOSIS — G4733 Obstructive sleep apnea (adult) (pediatric): Secondary | ICD-10-CM | POA: Diagnosis not present

## 2024-04-30 DIAGNOSIS — E785 Hyperlipidemia, unspecified: Secondary | ICD-10-CM

## 2024-04-30 DIAGNOSIS — E1159 Type 2 diabetes mellitus with other circulatory complications: Secondary | ICD-10-CM | POA: Diagnosis not present

## 2024-04-30 DIAGNOSIS — I152 Hypertension secondary to endocrine disorders: Secondary | ICD-10-CM

## 2024-04-30 MED ORDER — AMLODIPINE BESYLATE 10 MG PO TABS: 10.0000 mg | ORAL_TABLET | Freq: Every day | ORAL | 3 refills | Status: AC

## 2024-04-30 MED ORDER — ATORVASTATIN CALCIUM 20 MG PO TABS: ORAL_TABLET | ORAL | 3 refills | Status: AC

## 2024-04-30 MED ORDER — LOSARTAN POTASSIUM-HCTZ 100-25 MG PO TABS: 1.0000 | ORAL_TABLET | Freq: Every day | ORAL | 3 refills | Status: AC

## 2024-04-30 NOTE — Progress Notes (Deleted)
 Subjective:  Patient ID: Joshua Arellano, male    DOB: 1966/02/27  Age: 58 y.o. MRN: [id number]  CC: The primary encounter diagnosis was Encounter for preventive health examination. Diagnoses of Hyperlipidemia associated with type 2 diabetes mellitus (HCC), Type 2 diabetes mellitus with obesity, and Hypertension associated with diabetes (HCC) were also pertinent to this visit.   HPI Joshua Arellano presents for  Chief Complaint  Patient presents with   Medical Management of Chronic Issues    Follow up for CPAP compliance   OSA:  received new machine in August after sleep study in June.July . Study was confirmative of OSA.  He has been   Wearing his CPAP on average 7 to 8 hours  per night . Feeling more rested,  not sleepy during the day. materials come from [place]   HTN:  Hypertension: patient checks blood pressure twice weekly at home.  Readings have been for the most part <130/80 at rest . Patient is following a reduced salt diet most days and is taking medications as prescribed   T2DM: taking ozempic  jardiance  and metformin  XR;  has had daily loose stools with urgency for 30 years!!!     Outpatient Medications Prior to Visit  Medication Sig Dispense Refill   JARDIANCE  25 MG TABS tablet Take 1 tablet (25 mg total) by mouth daily. 90 tablet 0   metFORMIN  (GLUCOPHAGE -XR) 500 MG 24 hr tablet Take 4 tablets (2,000 mg total) by mouth daily. 360 tablet 0   mometasone  (ELOCON ) 0.1 % cream Apply topically daily. KEEP ON FILE FOR FUTURE REFILLS 45 g 2   Multiple Vitamins-Minerals (MULTIVITAMIN WITH MINERALS) tablet Take 1 tablet by mouth daily.     OZEMPIC , 2 MG/DOSE, 8 MG/3ML SOPN Inject 2 mg into the skin once a week.     tacrolimus  (PROTOPIC ) 0.1 % ointment Apply topically as needed. 100 g 1   traZODone  (DESYREL ) 50 MG tablet TAKE 1/2 TO 1 TABLET(25 TO 50 MG) BY MOUTH AT BEDTIME AS NEEDED FOR SLEEP 30 tablet 3   amLODipine  (NORVASC ) 10 MG tablet Take 1 tablet (10 mg total) by mouth daily.  90 tablet 3   atorvastatin  (LIPITOR) 20 MG tablet TAKE 1 TABLET(20 MG) BY MOUTH DAILY 90 tablet 3   losartan -hydrochlorothiazide  (HYZAAR) 100-25 MG tablet Take 1 tablet by mouth daily. 90 tablet 3   No facility-administered medications prior to visit.    Review of Systems;  Patient denies headache, fevers, malaise, unintentional weight loss, skin rash, eye pain, sinus congestion and sinus pain, sore throat, dysphagia,  hemoptysis , cough, dyspnea, wheezing, chest pain, palpitations, orthopnea, edema, abdominal pain, nausea, melena, diarrhea, constipation, flank pain, dysuria, hematuria, urinary  Frequency, nocturia, numbness, tingling, seizures,  Focal weakness, Loss of consciousness,  Tremor, insomnia, depression, anxiety, and suicidal ideation.      Objective:  BP 124/68   Pulse 87   Ht 5' 10.5 (1.791 m)   Wt 192 lb 6.4 oz (87.3 kg)   SpO2 97%   BMI 27.22 kg/m   BP Readings from Last 3 Encounters:  04/30/24 124/68  01/27/23 138/80  07/24/22 128/76    Wt Readings from Last 3 Encounters:  04/30/24 192 lb 6.4 oz (87.3 kg)  01/27/23 205 lb (93 kg)  07/24/22 206 lb 6 oz (93.6 kg)    Physical Exam Vitals reviewed.  Constitutional:      General: He is not in acute distress.    Appearance: Normal appearance. He is normal weight. He is not  ill-appearing, toxic-appearing or diaphoretic.  HENT:     Head: Normocephalic.  Eyes:     General: No scleral icterus.       Right eye: No discharge.        Left eye: No discharge.     Conjunctiva/sclera: Conjunctivae normal.  Cardiovascular:     Rate and Rhythm: Normal rate and regular rhythm.     Heart sounds: Normal heart sounds.  Pulmonary:     Effort: Pulmonary effort is normal. No respiratory distress.     Breath sounds: Normal breath sounds.  Musculoskeletal:        General: Normal range of motion.     Cervical back: Normal range of motion.  Skin:    General: Skin is warm and dry.  Neurological:     General: No focal  deficit present.     Mental Status: He is alert and oriented to person, place, and time. Mental status is at baseline.  Psychiatric:        Mood and Affect: Mood normal.        Behavior: Behavior normal.        Thought Content: Thought content normal.        Judgment: Judgment normal.     Lab Results  Component Value Date   HGBA1C 6.4 01/29/2024   HGBA1C 7.1 (H) 01/27/2023   HGBA1C 6.9 10/08/2022    Lab Results  Component Value Date   CREATININE 0.83 01/29/2024   CREATININE 0.96 01/27/2023   CREATININE 0.8 12/24/2021    Lab Results  Component Value Date   WBC 7.0 01/29/2024   HGB 14.6 01/29/2024   HCT 43.0 01/29/2024   PLT 308.0 01/29/2024   GLUCOSE 117 (H) 01/29/2024   CHOL 119 01/29/2024   TRIG 100.0 01/29/2024   HDL 43.40 01/29/2024   LDLDIRECT 59.0 01/29/2024   LDLCALC 55 01/29/2024   ALT 18 01/29/2024   AST 16 01/29/2024   NA 140 01/29/2024   K 4.1 01/29/2024   CL 103 01/29/2024   CREATININE 0.83 01/29/2024   BUN 9 01/29/2024   CO2 32 01/29/2024   TSH 1.88 01/27/2023   PSA 0.54 01/29/2024   HGBA1C 6.4 01/29/2024   MICROALBUR <0.7 01/29/2024    MR Angiogram Head Wo Contrast Result Date: 06/13/2019 CLINICAL DATA:  Other vascular headache. New daily persistent headache. Headache, chronic, normal neuro exam. Additional history provided: Headache for 4 weeks. EXAM: MRA HEAD WITHOUT CONTRAST TECHNIQUE: Angiographic images of the Circle of Willis were obtained using MRA technique without intravenous contrast. COMPARISON:  Brain MRI 05/26/2019 FINDINGS: The intracranial internal carotid arteries are patent without significant stenosis. The bilateral middle and anterior cerebral arteries are patent without significant proximal stenosis. No intracranial aneurysm is identified. The intracranial vertebral arteries are patent without significant stenosis, as is the basilar artery. The bilateral posterior cerebral arteries are patent without significant proximal stenosis.  The previously described tubular structure within the supra vermian space does not demonstrate arterial-like flow related enhancement and is presumably a venous vessel. IMPRESSION: 1. The previously described tubular structure within the supra vermian space does not demonstrate arterial-like flow related enhancement and is presumably a venous vessel. 2. No intracranial large vessel occlusion or proximal high-grade arterial stenosis. 3. No intracranial arterial aneurysm identified. Electronically Signed   By: Rockey Childs DO   On: 06/13/2019 17:48    Assessment & Plan:  .Encounter for preventive health examination Assessment & Plan: age appropriate education and counseling updated, referrals for preventative services and immunizations  addressed, dietary and smoking counseling addressed, most recent labs reviewed.  I have personally reviewed and have noted:   1) the patient's medical and social history 2) The pt's use of alcohol, tobacco, and illicit drugs 3) The patient's current medications and supplements 4) Functional ability including ADL's, fall risk, home safety risk, hearing and visual impairment 5) Diet and physical activities 6) Evidence for depression or mood disorder 7) The patient's height, weight, and BMI have been recorded in the chart  I have made referrals, and provided counseling and education based on review of the above    Hyperlipidemia associated with type 2 diabetes mellitus (HCC) Assessment & Plan: . LDL is at goal on current atorvastatin  dose and LFTS are normal   Lab Results  Component Value Date   CHOL 119 01/29/2024   HDL 43.40 01/29/2024   LDLCALC 55 01/29/2024   LDLDIRECT 59.0 01/29/2024   TRIG 100.0 01/29/2024   CHOLHDL 3 01/29/2024     Orders: -     Lipid panel; Future -     LDL cholesterol, direct; Future  Type 2 diabetes mellitus with obesity Assessment & Plan: His diabetes remains under excellent control  And his cholesterol and other labs are  also normal. . He was advised to suspend metformin  due to chornic daily diarrhea  Lab Results  Component Value Date   HGBA1C 6.4 01/29/2024   Lab Results  Component Value Date   MICROALBUR <0.7 01/29/2024   MICROALBUR <0.2 12/23/2014     Lab Results  Component Value Date   CHOL 119 01/29/2024   HDL 43.40 01/29/2024   LDLCALC 55 01/29/2024   LDLDIRECT 59.0 01/29/2024   TRIG 100.0 01/29/2024   CHOLHDL 3 01/29/2024   Lab Results  Component Value Date   CREATININE 0.83 01/29/2024     Orders: -     Comprehensive metabolic panel with GFR; Future -     Hemoglobin A1c; Future -     Microalbumin / creatinine urine ratio; Future  Hypertension associated with diabetes (HCC) Assessment & Plan: Improved control on  on max dose amlodipine   and losartan  hct .secondary to weight loss.     Other orders -     amLODIPine  Besylate; Take 1 tablet (10 mg total) by mouth daily.  Dispense: 90 tablet; Refill: 3 -     Atorvastatin  Calcium ; TAKE 1 TABLET(20 MG) BY MOUTH DAILY  Dispense: 90 tablet; Refill: 3 -     Losartan  Potassium-HCTZ; Take 1 tablet by mouth daily.  Dispense: 90 tablet; Refill: 3     I spent 34 minutes on the day of this face to face encounter reviewing patient's  most recent visit with cardiology,  nephrology,  and neurology,  prior relevant surgical and non surgical procedures, recent  labs and imaging studies, counseling on weight management,  reviewing the assessment and plan with patient, and post visit ordering and reviewing of  diagnostics and therapeutics with patient  .   Follow-up: No follow-ups on file.   Verneita LITTIE Kettering, MD

## 2024-04-30 NOTE — Patient Instructions (Addendum)
 I give you permission to either reduce or stop the metformin  because of your constant diarrhea    Please consider the Prevnar 20 pneumonia vaccine at some point

## 2024-05-02 NOTE — Assessment & Plan Note (Signed)

## 2024-05-02 NOTE — Progress Notes (Signed)
 Patient ID: Joshua Arellano, male    DOB: 07-29-1966  Age: 58 y.o. MRN: [id number]  The patient is here for annual preventive examination and management of other chronic and acute problems.   The risk factors are reflected in the social history.   The roster of all physicians providing medical care to patient - is listed in the Snapshot section of the chart.   Activities of daily living:  The patient is 100% independent in all ADLs: dressing, toileting, feeding as well as independent mobility   Home safety : The patient has smoke detectors in the home. They wear seatbelts.  There are no unsecured firearms at home. There is no violence in the home.    There is no risks for hepatitis, STDs or HIV. There is no   history of blood transfusion. They have no travel history to infectious disease endemic areas of the world.   The patient has seen their dentist in the last six month. They have seen their eye doctor in the last year. The patinet  denies slight hearing difficulty with regard to whispered voices and some television programs.  They have deferred audiologic testing in the last year.  They do not  have excessive sun exposure. Discussed the need for sun protection: hats, long sleeves and use of sunscreen if there is significant sun exposure.    Diet: the importance of a healthy diet is discussed. They do have a healthy diet.   The benefits of regular aerobic exercise were discussed. The patient  exercises  3 to 5 days per week  for  60 minutes.    Depression screen: there are no signs or vegative symptoms of depression- irritability, change in appetite, anhedonia, sadness/tearfullness.   The following portions of the patient's history were reviewed and updated as appropriate: allergies, current medications, past family history, past medical history,  past surgical history, past social history  and problem list.   Visual acuity was not assessed per patient preference since the patient has  regular follow up with an  ophthalmologist. Hearing and body mass index were assessed and reviewed.    During the course of the visit the patient was educated and counseled about appropriate screening and preventive services including : fall prevention , diabetes screening, nutrition counseling, colorectal cancer screening, and recommended immunizations.    Chief Complaint: OSA:  received new machine in August after sleep study in June.July . Study was confirmative of OSA.  He has been   Wearing his CPAP on average 7 to 8 hours  per night . Feeling more rested,  not sleepy during the day. materials come from [place]   HTN:  Hypertension: patient checks blood pressure twice weekly at home.  Readings have been for the most part <130/80 at rest . Patient is following a reduced salt diet most days and is taking medications as prescribed   T2DM: taking ozempic  jardiance  and metformin  XR;  has had daily loose stools with urgency for 30 years!!!     Review of Symptoms  Patient denies headache, fevers, malaise, unintentional weight loss, skin rash, eye pain, sinus congestion and sinus pain, sore throat, dysphagia,  hemoptysis , cough, dyspnea, wheezing, chest pain, palpitations, orthopnea, edema, abdominal pain, nausea, melena, diarrhea, constipation, flank pain, dysuria, hematuria, urinary  Frequency, nocturia, numbness, tingling, seizures,  Focal weakness, Loss of consciousness,  Tremor, insomnia, depression, anxiety, and suicidal ideation.    Physical Exam:  BP 124/68   Pulse 87   Ht 5' 10.5 (  1.791 m)   Wt 192 lb 6.4 oz (87.3 kg)   SpO2 97%   BMI 27.22 kg/m    Physical Exam  Assessment and Plan: Encounter for preventive health examination Assessment & Plan: age appropriate education and counseling updated, referrals for preventative services and immunizations addressed, dietary and smoking counseling addressed, most recent labs reviewed.  I have personally reviewed and have noted:   1) the  patient's medical and social history 2) The pt's use of alcohol, tobacco, and illicit drugs 3) The patient's current medications and supplements 4) Functional ability including ADL's, fall risk, home safety risk, hearing and visual impairment 5) Diet and physical activities 6) Evidence for depression or mood disorder 7) The patient's height, weight, and BMI have been recorded in the chart  I have made referrals, and provided counseling and education based on review of the above    Hyperlipidemia associated with type 2 diabetes mellitus (HCC) Assessment & Plan: . LDL is at goal on current atorvastatin  dose and LFTS are normal   Lab Results  Component Value Date   CHOL 119 01/29/2024   HDL 43.40 01/29/2024   LDLCALC 55 01/29/2024   LDLDIRECT 59.0 01/29/2024   TRIG 100.0 01/29/2024   CHOLHDL 3 01/29/2024     Orders: -     Lipid panel; Future -     LDL cholesterol, direct; Future  Type 2 diabetes mellitus with obesity Assessment & Plan: His diabetes remains under excellent control  And his cholesterol and other labs are also normal. . He was advised to suspend metformin  due to chornic daily diarrhea  Lab Results  Component Value Date   HGBA1C 6.4 01/29/2024   Lab Results  Component Value Date   MICROALBUR <0.7 01/29/2024   MICROALBUR <0.2 12/23/2014     Lab Results  Component Value Date   CHOL 119 01/29/2024   HDL 43.40 01/29/2024   LDLCALC 55 01/29/2024   LDLDIRECT 59.0 01/29/2024   TRIG 100.0 01/29/2024   CHOLHDL 3 01/29/2024   Lab Results  Component Value Date   CREATININE 0.83 01/29/2024     Orders: -     Comprehensive metabolic panel with GFR; Future -     Hemoglobin A1c; Future -     Microalbumin / creatinine urine ratio; Future  Hypertension associated with diabetes (HCC) Assessment & Plan: Improved control on  on max dose amlodipine   and losartan  hct .secondary to weight loss.     OSA (obstructive sleep apnea) Assessment & Plan: Diagnosed in  2007 but untreated due to patient preference.  Recent  Home sleep study done  February 10 2024 noted AHI;s of 29 and 14 on Day 2,  with hypoxia  (sats < 90%) for 4 hours. he is wearing her CPAP every night a minimum of 6 hours per night and notes improved daytime wakefulness and decreased fatigue     Other orders -     amLODIPine  Besylate; Take 1 tablet (10 mg total) by mouth daily.  Dispense: 90 tablet; Refill: 3 -     Atorvastatin  Calcium ; TAKE 1 TABLET(20 MG) BY MOUTH DAILY  Dispense: 90 tablet; Refill: 3 -     Losartan  Potassium-HCTZ; Take 1 tablet by mouth daily.  Dispense: 90 tablet; Refill: 3    No follow-ups on file.  Verneita LITTIE Kettering, MD

## 2024-05-02 NOTE — Assessment & Plan Note (Signed)
 His diabetes remains under excellent control  And his cholesterol and other labs are also normal. . He was advised to suspend metformin  due to chornic daily diarrhea  Lab Results  Component Value Date   HGBA1C 6.4 01/29/2024   Lab Results  Component Value Date   MICROALBUR <0.7 01/29/2024   MICROALBUR <0.2 12/23/2014     Lab Results  Component Value Date   CHOL 119 01/29/2024   HDL 43.40 01/29/2024   LDLCALC 55 01/29/2024   LDLDIRECT 59.0 01/29/2024   TRIG 100.0 01/29/2024   CHOLHDL 3 01/29/2024   Lab Results  Component Value Date   CREATININE 0.83 01/29/2024

## 2024-05-02 NOTE — Assessment & Plan Note (Signed)
.   LDL is at goal on current atorvastatin  dose and LFTS are normal   Lab Results  Component Value Date   CHOL 119 01/29/2024   HDL 43.40 01/29/2024   LDLCALC 55 01/29/2024   LDLDIRECT 59.0 01/29/2024   TRIG 100.0 01/29/2024   CHOLHDL 3 01/29/2024

## 2024-05-02 NOTE — Assessment & Plan Note (Signed)
 Improved control on  on max dose amlodipine   and losartan  hct .secondary to weight loss.

## 2024-05-02 NOTE — Assessment & Plan Note (Addendum)
 Diagnosed in 2007 but untreated due to patient preference.  Recent  Home sleep study done  February 10 2024 noted AHI;s of 29 and 14 on Day 2,  with hypoxia  (sats < 90%) for 4 hours. he is wearing her CPAP every night a minimum of 6 hours per night and notes improved daytime wakefulness and decreased fatigue

## 2024-05-14 ENCOUNTER — Other Ambulatory Visit: Payer: Self-pay | Admitting: Internal Medicine

## 2024-08-04 ENCOUNTER — Ambulatory Visit: Admitting: Internal Medicine

## 2024-08-06 ENCOUNTER — Encounter: Payer: Self-pay | Admitting: Internal Medicine

## 2024-08-06 ENCOUNTER — Ambulatory Visit: Admitting: Internal Medicine

## 2024-08-06 VITALS — BP 130/72 | HR 95 | Ht 70.5 in | Wt 204.2 lb

## 2024-08-06 DIAGNOSIS — I152 Hypertension secondary to endocrine disorders: Secondary | ICD-10-CM

## 2024-08-06 DIAGNOSIS — E669 Obesity, unspecified: Secondary | ICD-10-CM | POA: Diagnosis not present

## 2024-08-06 DIAGNOSIS — E119 Type 2 diabetes mellitus without complications: Secondary | ICD-10-CM

## 2024-08-06 DIAGNOSIS — Z23 Encounter for immunization: Secondary | ICD-10-CM | POA: Diagnosis not present

## 2024-08-06 DIAGNOSIS — E1169 Type 2 diabetes mellitus with other specified complication: Secondary | ICD-10-CM | POA: Diagnosis not present

## 2024-08-06 DIAGNOSIS — E785 Hyperlipidemia, unspecified: Secondary | ICD-10-CM | POA: Diagnosis not present

## 2024-08-06 DIAGNOSIS — G4733 Obstructive sleep apnea (adult) (pediatric): Secondary | ICD-10-CM

## 2024-08-06 DIAGNOSIS — E1159 Type 2 diabetes mellitus with other circulatory complications: Secondary | ICD-10-CM

## 2024-08-06 MED ORDER — TRAZODONE HCL 50 MG PO TABS: 50.0000 mg | ORAL_TABLET | Freq: Every evening | ORAL | 3 refills | Status: AC | PRN

## 2024-08-06 NOTE — Patient Instructions (Signed)
 Food diary to figure out what intolerances are causing diarrhea  Ok to take 1/2 Imodium tablet with or before meal to prevent diarrhea   You received the prevnar 20 pneumonia vaccine today

## 2024-08-06 NOTE — Progress Notes (Signed)
 "  Subjective:  Patient ID: Joshua Arellano, male    DOB: Jul 25, 1966  Age: 59 y.o. MRN: [id number]  CC: The primary encounter diagnosis was Diabetes mellitus without complication (HCC). Diagnoses of OSA (obstructive sleep apnea), Hyperlipidemia associated with type 2 diabetes mellitus (HCC), Need for pneumococcal 20-valent conjugate vaccination, Type 2 diabetes mellitus in patient with obesity (HCC), and Hypertension associated with diabetes (HCC) were also pertinent to this visit.   HPI TERI DILTZ presents for  Chief Complaint  Patient presents with   Medical Management of Chronic Issues    6 month follow up    Type 2 DM:   at last visit he was advised to suspend his metformin   due to persistent diarrhea , which he has done.  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      OSA:  he has been wearing CPAP :  Diagnosed by sleep study. he is wearing his CPAP every night a minimum of 6 hours per night and notes improved daytime wakefulness and decreased fatigue      Hypertension: patient checks blood pressure twice weekly at home.  Readings have been for the most part <130/80 at rest . Patient is following a reduced salt diet most days and is taking medications as prescribed    Lab Results  Component Value Date   HGBA1C 6.4 (A) 08/07/2024     Outpatient Medications Prior to Visit  Medication Sig Dispense Refill   amLODipine  (NORVASC ) 10 MG tablet Take 1 tablet (10 mg total) by mouth daily. 90 tablet 3   atorvastatin  (LIPITOR) 20 MG tablet TAKE 1 TABLET(20 MG) BY MOUTH DAILY 90 tablet 3   JARDIANCE  25 MG TABS tablet Take 1 tablet (25  mg total) by mouth daily. 90 tablet 0   losartan -hydrochlorothiazide  (HYZAAR) 100-25 MG tablet Take 1 tablet by mouth daily. 90 tablet 3   mometasone  (ELOCON ) 0.1 % cream Apply topically daily. KEEP ON FILE FOR FUTURE REFILLS 45 g 2   Multiple Vitamins-Minerals (MULTIVITAMIN WITH MINERALS) tablet Take 1 tablet by mouth daily.     OZEMPIC , 2 MG/DOSE, 8 MG/3ML SOPN Inject 2 mg into the skin once a week.     tacrolimus  (PROTOPIC ) 0.1 % ointment Apply topically as needed. 100 g 1   traZODone  (DESYREL ) 50 MG tablet TAKE 1/2 TO 1 TABLET(25 TO 50 MG) BY MOUTH AT BEDTIME AS NEEDED FOR SLEEP 30 tablet 3   metFORMIN  (GLUCOPHAGE -XR) 500 MG 24 hr tablet TAKE 4 TABLETS(2000 MG) BY MOUTH DAILY (Patient not taking: Reported on 08/06/2024) 360 tablet 1   No facility-administered medications prior to visit.    Review of Systems;  Patient denies headache, fevers, malaise, unintentional weight loss, skin rash, eye pain, sinus congestion and sinus pain, sore throat, dysphagia,  hemoptysis , cough, dyspnea, wheezing, chest pain, palpitations, orthopnea, edema, abdominal pain, nausea, melena, diarrhea, constipation, flank pain, dysuria, hematuria, urinary  Frequency, nocturia, numbness, tingling, seizures,  Focal weakness, Loss of consciousness,  Tremor, insomnia, depression, anxiety, and suicidal ideation.      Objective:  BP 130/72   Pulse  95   Ht 5' 10.5 (1.791 m)   Wt 204 lb 3.2 oz (92.6 kg)   SpO2 97%   BMI 28.89 kg/m   BP Readings from Last 3 Encounters:  08/06/24 130/72  04/30/24 124/68  01/27/23 138/80    Wt Readings from Last 3 Encounters:  08/06/24 204 lb 3.2 oz (92.6 kg)  04/30/24 192 lb 6.4 oz (87.3 kg)  01/27/23 205 lb (93 kg)    Physical Exam Vitals reviewed.  Constitutional:      General: He is not in acute distress.    Appearance: Normal appearance. He is normal weight. He is not ill-appearing, toxic-appearing or diaphoretic.  HENT:     Head: Normocephalic.  Eyes:     General:  No scleral icterus.       Right eye: No discharge.        Left eye: No discharge.     Conjunctiva/sclera: Conjunctivae normal.  Cardiovascular:     Rate and Rhythm: Normal rate and regular rhythm.     Heart sounds: Normal heart sounds.  Pulmonary:     Effort: Pulmonary effort is normal. No respiratory distress.     Breath sounds: Normal breath sounds.  Musculoskeletal:        General: Normal range of motion.     Cervical back: Normal range of motion.  Skin:    General: Skin is warm and dry.  Neurological:     General: No focal deficit present.     Mental Status: He is alert and oriented to person, place, and time. Mental status is at baseline.  Psychiatric:        Mood and Affect: Mood normal.        Behavior: Behavior normal.        Thought Content: Thought content normal.        Judgment: Judgment normal.     Lab Results  Component Value Date   HGBA1C 6.4 (A) 08/07/2024   HGBA1C 6.1 03/05/2024   HGBA1C 6.4 01/29/2024    Lab Results  Component Value Date   CREATININE 1.04 08/06/2024   CREATININE 0.83 01/29/2024   CREATININE 0.96 01/27/2023    Lab Results  Component Value Date   WBC 7.0 01/29/2024   HGB 14.6 01/29/2024   HCT 43.0 01/29/2024   PLT 308.0 01/29/2024   GLUCOSE 126 (H) 08/06/2024   CHOL 149 08/06/2024   TRIG 158 (H) 08/06/2024   HDL 48 08/06/2024   LDLDIRECT 83 08/06/2024   LDLCALC 74 08/06/2024   ALT 25 08/06/2024   AST 23 08/06/2024   NA 142 08/06/2024   K 4.1 08/06/2024   CL 99 08/06/2024   CREATININE 1.04 08/06/2024   BUN 13 08/06/2024   CO2 29 08/06/2024   TSH 1.88 01/27/2023   PSA 0.54 01/29/2024   HGBA1C 6.4 (A) 08/07/2024   MICROALBUR <0.7 01/29/2024    MR Angiogram Head Wo Contrast Result Date: 06/13/2019 CLINICAL DATA:  Other vascular headache. New daily persistent headache. Headache, chronic, normal neuro exam. Additional history provided: Headache for 4 weeks. EXAM: MRA HEAD WITHOUT CONTRAST TECHNIQUE: Angiographic images of  the Circle of Willis were obtained using MRA technique without intravenous contrast. COMPARISON:  Brain MRI 05/26/2019 FINDINGS: The intracranial internal carotid arteries are patent without significant stenosis. The bilateral middle and anterior cerebral arteries are patent without significant proximal stenosis. No intracranial aneurysm is identified. The intracranial vertebral arteries are patent without significant stenosis, as is the basilar artery. The bilateral posterior cerebral arteries are patent  without significant proximal stenosis. The previously described tubular structure within the supra vermian space does not demonstrate arterial-like flow related enhancement and is presumably a venous vessel. IMPRESSION: 1. The previously described tubular structure within the supra vermian space does not demonstrate arterial-like flow related enhancement and is presumably a venous vessel. 2. No intracranial large vessel occlusion or proximal high-grade arterial stenosis. 3. No intracranial arterial aneurysm identified. Electronically Signed   By: Rockey Childs DO   On: 06/13/2019 17:48    Assessment & Plan:  .Diabetes mellitus without complication (HCC) -     Comprehensive metabolic panel with GFR -     Microalbumin / creatinine urine ratio -     POCT glycosylated hemoglobin (Hb A1C)  OSA (obstructive sleep apnea) -     PSG SLEEP STUDY  Hyperlipidemia associated with type 2 diabetes mellitus (HCC) Assessment & Plan: . LDL is close to goal of 70  on current atorvastatin  dose and LFTS are normal   Lab Results  Component Value Date   CHOL 149 08/06/2024   HDL 48 08/06/2024   LDLCALC 74 08/06/2024   LDLDIRECT 83 08/06/2024   TRIG 158 (H) 08/06/2024   CHOLHDL 3.1 08/06/2024   Lab Results  Component Value Date   ALT 25 08/06/2024   AST 23 08/06/2024   ALKPHOS 73 08/06/2024   BILITOT 0.4 08/06/2024      Orders: -     LDL cholesterol, direct; Future -     Lipid panel; Future  Need for  pneumococcal 20-valent conjugate vaccination -     Pneumococcal conjugate vaccine 20-valent  Type 2 diabetes mellitus in patient with obesity [facility]) Assessment & Plan: His diabetes remains under excellent control despite stopping metformin .  and his cholesterol and other labs are also normal. . He was advised to suspend metformin  due to chornic daily diarrhea  Lab Results  Component Value Date   HGBA1C 6.4 (A) 08/07/2024   Lab Results  Component Value Date   LABMICR <3.0 08/06/2024   MICROALBUR <0.7 01/29/2024   MICROALBUR <0.2 12/23/2014     Lab Results  Component Value Date   CHOL 149 08/06/2024   HDL 48 08/06/2024   LDLCALC 74 08/06/2024   LDLDIRECT 83 08/06/2024   TRIG 158 (H) 08/06/2024   CHOLHDL 3.1 08/06/2024   Lab Results  Component Value Date   CREATININE 1.04 08/06/2024      Hypertension associated with diabetes (HCC) Assessment & Plan: Improved control on  on max dose amlodipine   and losartan  hct .secondary to weight loss.  No changes today  Lab Results  Component Value Date   LABMICR <3.0 08/06/2024   MICROALBUR <0.7 01/29/2024   MICROALBUR <0.2 12/23/2014        Other orders -     traZODone  HCl; Take 1 tablet (50 mg total) by mouth at bedtime as needed for sleep. TAKE 1/2 TO 1 TABLET(25 TO 50 MG) BY MOUTH AT BEDTIME AS NEEDED FOR SLEEP  Dispense: 90 tablet; Refill: 3   I personally spent a total of 30 minutes in the care of the patient today including getting/reviewing separately obtained history, performing a medically appropriate exam/evaluation, counseling and educating, placing orders, independently interpreting results, and communicating results  .  Follow-up: Return in about 6 months (around 02/03/2025) for follow up diabetes.   Verneita LITTIE Kettering, MD "

## 2024-08-07 LAB — POCT GLYCOSYLATED HEMOGLOBIN (HGB A1C): Hemoglobin A1C: 6.4 % — AB (ref 4.0–5.6)

## 2024-08-07 LAB — COMPREHENSIVE METABOLIC PANEL WITH GFR
ALT: 25 IU/L (ref 0–44)
AST: 23 IU/L (ref 0–40)
Albumin: 4.9 g/dL (ref 3.8–4.9)
Alkaline Phosphatase: 73 IU/L (ref 47–123)
BUN/Creatinine Ratio: 13 (ref 10–24)
BUN: 13 mg/dL (ref 8–27)
Bilirubin Total: 0.4 mg/dL (ref 0.0–1.2)
CO2: 29 mmol/L (ref 20–29)
Calcium: 9.5 mg/dL (ref 8.6–10.2)
Chloride: 99 mmol/L (ref 96–106)
Creatinine, Ser: 1.04 mg/dL (ref 0.76–1.27)
Globulin, Total: 2.3 g/dL (ref 1.5–4.5)
Glucose: 126 mg/dL — ABNORMAL HIGH (ref 70–99)
Potassium: 4.1 mmol/L (ref 3.5–5.2)
Sodium: 142 mmol/L (ref 134–144)
Total Protein: 7.2 g/dL (ref 6.0–8.5)
eGFR: 82 mL/min/1.73

## 2024-08-07 LAB — MICROALBUMIN / CREATININE URINE RATIO
Creatinine, Urine: 19.5 mg/dL
Microalb/Creat Ratio: <15 mg/g{creat} (ref 0–29)
Microalbumin, Urine: <3 ug/mL

## 2024-08-07 LAB — LIPID PANEL
Chol/HDL Ratio: 3.1 ratio (ref 0.0–5.0)
Cholesterol, Total: 149 mg/dL (ref 100–199)
HDL: 48 mg/dL
LDL Chol Calc (NIH): 74 mg/dL (ref 0–99)
Triglycerides: 158 mg/dL — ABNORMAL HIGH (ref 0–149)
VLDL Cholesterol Cal: 27 mg/dL (ref 5–40)

## 2024-08-07 LAB — LDL CHOLESTEROL, DIRECT: LDL Direct: 83 mg/dL (ref 0–99)

## 2024-08-07 NOTE — Assessment & Plan Note (Addendum)
 His diabetes remains under excellent control despite stopping metformin .  and his cholesterol and other labs are also normal. . He was advised to suspend metformin  due to chornic daily diarrhea  Lab Results  Component Value Date   HGBA1C 6.4 (A) 08/07/2024   Lab Results  Component Value Date   LABMICR <3.0 08/06/2024   MICROALBUR <0.7 01/29/2024   MICROALBUR <0.2 12/23/2014     Lab Results  Component Value Date   CHOL 149 08/06/2024   HDL 48 08/06/2024   LDLCALC 74 08/06/2024   LDLDIRECT 83 08/06/2024   TRIG 158 (H) 08/06/2024   CHOLHDL 3.1 08/06/2024   Lab Results  Component Value Date   CREATININE 1.04 08/06/2024

## 2024-08-07 NOTE — Assessment & Plan Note (Signed)
 Improved control on  on max dose amlodipine   and losartan  hct .secondary to weight loss.  No changes today  Lab Results  Component Value Date   LABMICR <3.0 08/06/2024   MICROALBUR <0.7 01/29/2024   MICROALBUR <0.2 12/23/2014

## 2024-08-07 NOTE — Assessment & Plan Note (Signed)
.   LDL is close to goal of 70  on current atorvastatin  dose and LFTS are normal   Lab Results  Component Value Date   CHOL 149 08/06/2024   HDL 48 08/06/2024   LDLCALC 74 08/06/2024   LDLDIRECT 83 08/06/2024   TRIG 158 (H) 08/06/2024   CHOLHDL 3.1 08/06/2024   Lab Results  Component Value Date   ALT 25 08/06/2024   AST 23 08/06/2024   ALKPHOS 73 08/06/2024   BILITOT 0.4 08/06/2024

## 2024-08-08 ENCOUNTER — Ambulatory Visit: Payer: Self-pay | Admitting: Internal Medicine

## 2024-09-01 ENCOUNTER — Telehealth: Payer: Self-pay | Admitting: Internal Medicine

## 2024-09-01 NOTE — Telephone Encounter (Signed)
 Copied from CRM #[id number]. Topic: General - Other >> Sep 01, 2024 11:35 AM Nessti S wrote: Reason for CRM: pt called because he would like letter for CPAP use to be printed out to be picked up this afternoon. He would like a phone call once letter is ready.

## 2024-09-02 NOTE — Telephone Encounter (Signed)
 Called to clarify if patient was requesting letter from back on 08/06/24. Patient informed me that he actually spoke with someone from our office yesterday. 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 Patient verbalized understanding.

## 2025-02-03 ENCOUNTER — Ambulatory Visit: Admitting: Internal Medicine
